# Patient Record
Sex: Female | Born: 1960 | Race: Black or African American | Hispanic: No | Smoking: Never smoker
Health system: Southern US, Community
[De-identification: ages and names within clinical notes are randomized; demographics above are authoritative.]

## PROBLEM LIST (undated history)

## (undated) DIAGNOSIS — G4730 Sleep apnea, unspecified: Secondary | ICD-10-CM

## (undated) DIAGNOSIS — F419 Anxiety disorder, unspecified: Secondary | ICD-10-CM

## (undated) DIAGNOSIS — G43909 Migraine, unspecified, not intractable, without status migrainosus: Secondary | ICD-10-CM

## (undated) DIAGNOSIS — J45909 Unspecified asthma, uncomplicated: Secondary | ICD-10-CM

## (undated) DIAGNOSIS — I10 Essential (primary) hypertension: Secondary | ICD-10-CM

## (undated) DIAGNOSIS — D151 Benign neoplasm of heart: Secondary | ICD-10-CM

## (undated) DIAGNOSIS — I82409 Acute embolism and thrombosis of unspecified deep veins of unspecified lower extremity: Secondary | ICD-10-CM

## (undated) DIAGNOSIS — F4310 Post-traumatic stress disorder, unspecified: Secondary | ICD-10-CM

## (undated) DIAGNOSIS — E119 Type 2 diabetes mellitus without complications: Secondary | ICD-10-CM

## (undated) DIAGNOSIS — E669 Obesity, unspecified: Secondary | ICD-10-CM

## (undated) DIAGNOSIS — D649 Anemia, unspecified: Secondary | ICD-10-CM

## (undated) DIAGNOSIS — M797 Fibromyalgia: Secondary | ICD-10-CM

## (undated) DIAGNOSIS — D303 Benign neoplasm of bladder: Secondary | ICD-10-CM

## (undated) HISTORY — PX: OTHER SURGICAL HISTORY: SHX169

## (undated) HISTORY — PX: BACK SURGERY: SHX140

## (undated) HISTORY — PX: APPENDECTOMY: SHX54

## (undated) HISTORY — DX: Anxiety disorder, unspecified: F41.9

## (undated) HISTORY — DX: Anemia, unspecified: D64.9

## (undated) HISTORY — PX: TONSILLECTOMY: SUR1361

## (undated) HISTORY — DX: Type 2 diabetes mellitus without complications: E11.9

## (undated) HISTORY — DX: Fibromyalgia: M79.7

## (undated) HISTORY — PX: DILATION AND CURETTAGE OF UTERUS: SHX78

## (undated) HISTORY — DX: Benign neoplasm of bladder: D30.3

## (undated) HISTORY — DX: Sleep apnea, unspecified: G47.30

## (undated) HISTORY — DX: Migraine, unspecified, not intractable, without status migrainosus: G43.909

## (undated) HISTORY — DX: Unspecified asthma, uncomplicated: J45.909

## (undated) HISTORY — PX: HEART TUMOR EXCISION: SHX1732

## (undated) HISTORY — DX: Essential (primary) hypertension: I10

## (undated) HISTORY — DX: Post-traumatic stress disorder, unspecified: F43.10

## (undated) HISTORY — DX: Obesity, unspecified: E66.9

---

## 1986-10-08 HISTORY — PX: ECTOPIC PREGNANCY SURGERY: SHX613

## 1986-10-08 HISTORY — PX: OTHER SURGICAL HISTORY: SHX169

## 1991-10-09 HISTORY — PX: BACK SURGERY: SHX140

## 1995-10-09 HISTORY — PX: HEART TUMOR EXCISION: SHX1732

## 1998-09-11 ENCOUNTER — Emergency Department (HOSPITAL_COMMUNITY): Admission: EM | Admit: 1998-09-11 | Discharge: 1998-09-11 | Payer: Self-pay | Admitting: Emergency Medicine

## 2010-12-29 ENCOUNTER — Emergency Department (HOSPITAL_COMMUNITY): Payer: Self-pay

## 2010-12-29 ENCOUNTER — Emergency Department (HOSPITAL_COMMUNITY)
Admission: EM | Admit: 2010-12-29 | Discharge: 2010-12-29 | Disposition: A | Discharge status: Home / Self Care | Payer: Commercial Managed Care - PPO | Attending: Emergency Medicine | Admitting: Emergency Medicine

## 2010-12-29 DIAGNOSIS — R209 Unspecified disturbances of skin sensation: Secondary | ICD-10-CM | POA: Insufficient documentation

## 2010-12-29 DIAGNOSIS — R079 Chest pain, unspecified: Secondary | ICD-10-CM | POA: Insufficient documentation

## 2010-12-29 LAB — COMPREHENSIVE METABOLIC PANEL
Albumin: 3.5 g/dL (ref 3.5–5.2)
BUN: 6 mg/dL (ref 6–23)
Creatinine, Ser: 0.86 mg/dL (ref 0.4–1.2)
Potassium: 3.7 mEq/L (ref 3.5–5.1)
Total Protein: 7 g/dL (ref 6.0–8.3)

## 2010-12-29 LAB — CBC
MCH: 24.8 pg — ABNORMAL LOW (ref 26.0–34.0)
MCV: 79.6 fL (ref 78.0–100.0)
Platelets: 475 10*3/uL — ABNORMAL HIGH (ref 150–400)
RDW: 16.3 % — ABNORMAL HIGH (ref 11.5–15.5)
WBC: 6.1 10*3/uL (ref 4.0–10.5)

## 2010-12-29 LAB — POCT CARDIAC MARKERS
CKMB, poc: <1 ng/mL — ABNORMAL LOW (ref 1.0–8.0)
CKMB, poc: <1 ng/mL — ABNORMAL LOW (ref 1.0–8.0)
Myoglobin, poc: 43.3 ng/mL (ref 12–200)
Myoglobin, poc: 46.4 ng/mL (ref 12–200)
Troponin i, poc: <0.05 ng/mL (ref 0.00–0.09)

## 2012-02-17 ENCOUNTER — Encounter: Payer: Self-pay | Admitting: Emergency Medicine

## 2012-02-17 ENCOUNTER — Emergency Department
Admission: EM | Admit: 2012-02-17 | Discharge: 2012-02-17 | Disposition: A | Discharge status: Home / Self Care | Payer: Commercial Managed Care - PPO | Source: Home / Self Care | Attending: Family Medicine | Admitting: Family Medicine

## 2012-02-17 DIAGNOSIS — R300 Dysuria: Secondary | ICD-10-CM

## 2012-02-17 DIAGNOSIS — N390 Urinary tract infection, site not specified: Secondary | ICD-10-CM

## 2012-02-17 HISTORY — DX: Benign neoplasm of heart: D15.1

## 2012-02-17 LAB — POCT URINALYSIS DIP (MANUAL ENTRY)
Protein Ur, POC: NEGATIVE
Spec Grav, UA: >=1.03 (ref 1.005–1.03)
Urobilinogen, UA: 0.2 (ref 0–1)

## 2012-02-17 MED ORDER — PHENAZOPYRIDINE HCL 200 MG PO TABS: 200.0000 mg | ORAL_TABLET | Freq: Three times a day (TID) | ORAL | Status: AC

## 2012-02-17 MED ORDER — NITROFURANTOIN MONOHYD MACRO 100 MG PO CAPS: 100.0000 mg | ORAL_CAPSULE | Freq: Two times a day (BID) | ORAL | Status: AC

## 2012-02-17 NOTE — ED Notes (Signed)
Experiencing frequency of urination x 6 days. Doing dietary supplementation with protein and Vits. Had fever x 3 days. Today is having right flank area discomfort.

## 2012-02-17 NOTE — ED Provider Notes (Addendum)
History     CSN: [id number]  Arrival date & time 02/17/12  1415   First MD Initiated Contact with Patient 02/17/12 1452      Chief Complaint  Patient presents with  . Urinary Frequency      HPI Comments: Patient complains of four day history of mild dysuria, frequency, hesitancy.  She had a low grade fever initially.  Patient is a 51 y.o. female presenting with dysuria. The history is provided by the patient.  Dysuria  This is a new problem. Episode onset: 4 days ago. The problem occurs intermittently. The problem has not changed since onset.The quality of the pain is described as burning. The pain is mild. There has been no fever. Associated symptoms include chills, frequency, hesitancy and urgency. Pertinent negatives include no sweats, no nausea, no vomiting, no discharge, no hematuria, no possible pregnancy and no flank pain.    Past Medical History  Diagnosis Date  . Fibroma of heart     Past Surgical History  Procedure Date  . Back surgery   . Heart tumor excision     Family History  Problem Relation Age of Onset  . Cancer Mother   . Hypertension Mother   . Hyperlipidemia Mother   . Thyroid disease Sister   . Hypertension Maternal Aunt   . Heart failure Maternal Aunt   . Hypertension Maternal Aunt   . Hypertension Maternal Uncle     History  Substance Use Topics  . Smoking status: Never Smoker   . Smokeless tobacco: Not on file  . Alcohol Use: No    OB History    Grav Para Term Preterm Abortions TAB SAB Ect Mult Living                  Review of Systems  Constitutional: Positive for chills.  Gastrointestinal: Negative for nausea and vomiting.  Genitourinary: Positive for dysuria, hesitancy, urgency and frequency. Negative for hematuria and flank pain.  All other systems reviewed and are negative.    Allergies  Sulfa antibiotics  Home Medications   Current Outpatient Rx  Name Route Sig Dispense Refill  . NITROFURANTOIN MONOHYD MACRO 100 MG  PO CAPS Oral Take 1 capsule (100 mg total) by mouth 2 (two) times daily. 14 capsule 0  . PHENAZOPYRIDINE HCL 200 MG PO TABS Oral Take 1 tablet (200 mg total) by mouth 3 (three) times daily. Take with food 6 tablet 0    BP 158/90  Pulse 68  Temp(Src) 98.2 F (36.8 C) (Oral)  Resp 16  Ht 5' 5.5" (1.664 m)  Wt 204 lb (92.534 kg)  BMI 33.43 kg/m2  SpO2 98%  Physical Exam Nursing notes and Vital Signs reviewed. Appearance:  Patient appears healthy, stated age, and in no acute distress     ED Course  Procedures none   Labs Reviewed  URINE CULTURE pending  POCT URINALYSIS DIP (MANUAL ENTRY) (dated 02/16/12):  SG >= 1.030, mod blood, positive NIT, trace leuks      1. Dysuria   2. Urinary tract infection       MDM   Urine culture pending.  Begin Macrobid for one week; Pyridium for two days. Increase fluid intake.        Lattie Haw, MD 02/17/12 1605  Addendum:   No charge for this visit  Lattie Haw, MD 02/17/12 [phone]

## 2012-02-17 NOTE — Discharge Instructions (Signed)

## 2012-02-20 ENCOUNTER — Telehealth: Payer: Self-pay | Admitting: *Deleted

## 2012-02-20 LAB — URINE CULTURE: Colony Count: >=100000

## 2012-03-16 ENCOUNTER — Emergency Department (INDEPENDENT_AMBULATORY_CARE_PROVIDER_SITE_OTHER)
Admission: EM | Admit: 2012-03-16 | Discharge: 2012-03-16 | Disposition: A | Discharge status: Home / Self Care | Payer: Commercial Managed Care - PPO | Source: Home / Self Care

## 2012-03-16 DIAGNOSIS — R42 Dizziness and giddiness: Secondary | ICD-10-CM

## 2012-03-16 MED ORDER — MECLIZINE HCL 25 MG PO TABS: 25.0000 mg | ORAL_TABLET | Freq: Three times a day (TID) | ORAL | Status: AC | PRN

## 2012-03-16 NOTE — ED Provider Notes (Signed)
History     CSN: [id number]  Arrival date & time 03/16/12  1448   None     No chief complaint on file.  HPI Comments: Dizziness x 2 days  Has had vertiginous sxs going from sitting to standing. Feels like to the room is spinning.  No hearing loss. No headaches.  No vision changes.  No recent trauma.  Has to sit down to relieve sxs.  No CP or SOB. Pt states that she has been under a lot of stress trying to take care of her mother recently, is unsure if this is the cause of her symptoms.  Pt also with baseline hx/o anemia currently receiving iron transfusions for.  Has follow up in 1-2 days.      Past Medical History  Diagnosis Date  . Fibroma of heart     Past Surgical History  Procedure Date  . Back surgery   . Heart tumor excision     Family History  Problem Relation Age of Onset  . Cancer Mother   . Hypertension Mother   . Hyperlipidemia Mother   . Thyroid disease Sister   . Hypertension Maternal Aunt   . Heart failure Maternal Aunt   . Hypertension Maternal Aunt   . Hypertension Maternal Uncle     History  Substance Use Topics  . Smoking status: Never Smoker   . Smokeless tobacco: Not on file  . Alcohol Use: No    OB History    Grav Para Term Preterm Abortions TAB SAB Ect Mult Living                  Review of Systems  Constitutional: Negative for fever, chills, appetite change and fatigue.  HENT: Negative for hearing loss, ear pain, congestion and rhinorrhea.   Eyes: Negative for photophobia and visual disturbance.  Respiratory: Negative for cough, chest tightness and wheezing.   Cardiovascular: Negative for chest pain.  Genitourinary: Negative for dysuria.  Musculoskeletal: Negative for arthralgias and gait problem.  Neurological: Positive for dizziness. Negative for syncope, speech difficulty, weakness, light-headedness and numbness.    Allergies  Sulfa antibiotics  Home Medications  No current outpatient prescriptions on file.  There  were no vitals taken for this visit.  Physical Exam  Constitutional: She appears well-developed and well-nourished.  HENT:  Head: Normocephalic and atraumatic.  Eyes: Conjunctivae are normal. Pupils are equal, round, and reactive to light.  Neck: Normal range of motion. Neck supple.  Cardiovascular: Normal rate and regular rhythm.   Pulmonary/Chest: Effort normal and breath sounds normal.  Abdominal: Soft. Bowel sounds are normal.  Neurological: She is alert.       + dix hallpike,  Mild horizontal nystagmus   Skin: Skin is warm.    ED Course  Procedures   Labs Reviewed - No data to display No results found.   No diagnosis found.  Hgb-14  MDM  Exam consistent with BPPV. Will treat with meclizine. Spot hgb reassuring. Discussed neuro red flags. Handout given. Will follow up as needed.     The patient and/or caregiver has been counseled thoroughly with regard to treatment plan and/or medications prescribed including dosage, schedule, interactions, rationale for use, and possible side effects and they verbalize understanding. Diagnoses and expected course of recovery discussed and will return if not improved as expected or if the condition worsens. Patient and/or caregiver verbalized understanding.               Floydene Flock, MD 03/16/12  1478  Floydene Flock, MD 03/16/12 [phone]

## 2012-03-16 NOTE — Discharge Instructions (Signed)
Vertigo Vertigo means you feel like you or your surroundings are moving when they are not. Vertigo can be dangerous if it occurs when you are at work, driving, or performing difficult activities.  CAUSES  Vertigo occurs when there is a conflict of signals sent to your brain from the visual and sensory systems in your body. There are many different causes of vertigo, including:  Infections, especially in the inner ear.   A bad reaction to a drug or misuse of alcohol and medicines.   Withdrawal from drugs or alcohol.   Rapidly changing positions, such as lying down or rolling over in bed.   A migraine headache.   Decreased blood flow to the brain.   Increased pressure in the brain from a head injury, infection, tumor, or bleeding.  SYMPTOMS  You may feel as though the world is spinning around or you are falling to the ground. Because your balance is upset, vertigo can cause nausea and vomiting. You may have involuntary eye movements (nystagmus). DIAGNOSIS  Vertigo is usually diagnosed by physical exam. If the cause of your vertigo is unknown, your caregiver may perform imaging tests, such as an MRI scan (magnetic resonance imaging). TREATMENT  Most cases of vertigo resolve on their own, without treatment. Depending on the cause, your caregiver may prescribe certain medicines. If your vertigo is related to body position issues, your caregiver may recommend movements or procedures to correct the problem. In rare cases, if your vertigo is caused by certain inner ear problems, you may need surgery. HOME CARE INSTRUCTIONS   Follow your caregiver's instructions.   Avoid driving.   Avoid operating heavy machinery.   Avoid performing any tasks that would be dangerous to you or others during a vertigo episode.   Tell your caregiver if you notice that certain medicines seem to be causing your vertigo. Some of the medicines used to treat vertigo episodes can actually make them worse in some  people.  SEEK IMMEDIATE MEDICAL CARE IF:   Your medicines do not relieve your vertigo or are making it worse.   You develop problems with talking, walking, weakness, or using your arms, hands, or legs.   You develop severe headaches.   Your nausea or vomiting continues or gets worse.   You develop visual changes.   A family member notices behavioral changes.   Your condition gets worse.  MAKE SURE YOU:  Understand these instructions.   Will watch your condition.   Will get help right away if you are not doing well or get worse.  Document Released: 07/04/2005 Document Revised: 09/13/2011 Document Reviewed: 04/12/2011 [employer] Patient Information 2012 [employer]. 

## 2012-03-20 NOTE — ED Provider Notes (Signed)
Pt seen by Dr. Doroteo Bradford, MD 03/20/12 [phone]

## 2012-06-16 ENCOUNTER — Encounter: Payer: Self-pay | Admitting: Family Medicine

## 2012-06-16 ENCOUNTER — Ambulatory Visit: Payer: Commercial Managed Care - PPO | Admitting: Family Medicine

## 2012-06-16 VITALS — BP 169/93 | HR 63

## 2012-06-16 DIAGNOSIS — R0789 Other chest pain: Secondary | ICD-10-CM

## 2012-06-16 LAB — D-DIMER, QUANTITATIVE: D-Dimer, Quant: 0.4 ug/mL-FEU (ref 0.00–0.48)

## 2012-06-16 NOTE — Progress Notes (Signed)
Subjective:    Patient ID: Destiny Henderson, female    DOB: May 08, 1961, 50 y.o.   MRN: [id number]  HPI Mid chest pain all morning on the mid left side of chest. Says feels like has to hold her breath.  Doesn't feel like an elephant on her chest. Pressure is mild.  Says started this morning and now having numbness into th eleft arm.  No pain into her neck today.  Yesterday felt like had a mild ST.   No fever.  No strenous activity over the weekend. Was traveling and was in the car for 6 hours.  No cold sxs.  Chronic iron def tx.  IV iron Q 3 months.  No hx of heart disease.  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  Followed routinely in [place] for this.  Had a normal stress test in Oct 2012.  No on the benicar.  Family hx of heart disease adn stroke.  Mom with hx of BrCa Father with colon Ca.  MGM with COPD.     Review of Systems BP 169/93  Pulse 63  SpO2 99%    Allergies  Allergen Reactions  . Sulfa Antibiotics Rash    Past Medical History  Diagnosis Date  . Fibroma of heart     Past Surgical History  Procedure Date  . Back surgery   . Heart tumor excision     History   Social History  . Marital Status: Single    Spouse Name: N/A    Number of Children: N/A  . Years of Education: N/A   Occupational History  . Not on file.   Social History Main Topics  . Smoking status: Never Smoker   . Smokeless tobacco: Not on file  . Alcohol Use: No  . Drug Use: No  . Sexually Active:    Other Topics Concern  . Not on file   Social History Narrative  . No narrative on file    Family History  Problem Relation Age of Onset  . Cancer Mother   . Hypertension Mother   . Hyperlipidemia Mother   . Thyroid disease Sister   . Hypertension Maternal Aunt   . Heart failure Maternal Aunt   . Hypertension Maternal Aunt   . Hypertension Maternal Uncle     No outpatient encounter prescriptions on file as of 06/16/2012.          Objective:   Physical Exam    Constitutional: She is oriented to person, place, and time. She appears well-developed and well-nourished.  HENT:  Head: Normocephalic and atraumatic.  Cardiovascular: Normal rate, regular rhythm and normal heart sounds.   Pulmonary/Chest: Effort normal and breath sounds normal.  Abdominal: Soft. Bowel sounds are normal. She exhibits no distension and no mass. There is tenderness. There is no rebound and no guarding.       + supra pubic tenderness.   Musculoskeletal: She exhibits no edema.       Mildly tender left calf but no swelling or redness or warmth.   Neurological: She is alert and oriented to person, place, and time.  Skin: Skin is warm and dry.  Psychiatric: She has a normal mood and affect. Her behavior is normal.     No carotid bruits.      Assessment & Plan:  Atypical Chset Pain - discussed that I would like to get an EKG today. Unfortunately she is without insurance and is very concerned about the potential cost.  I  explained to her that ruling out a  heart attack and possibly a PE is extremely important. At this point in time I am concerned about a PE as well because she was traveling in the car for 6 hours yesterday, last night had left leg pain that was so intense it was hard for her fall asleep, and now has had chest pain today. She denies any shortness of breath her pulse ox is normal. EKG shows rate of 58 bpm, inverted T waves in Lead 3, aVR, poor R wave progression. No acute ST-T wave changes.  Will check cardiac enzymes. She also normally gets IV iron infusions every 3 months her last one was in March, there again because of insurance reasons. She can also be severely anemic which may be contributing to some of her chest pain.  Hypertension-not well controlled. Right now she wants to not take medications. She prefers holistic approach. I discussed with her that she might want to really consider setting a time frame such as 3-6 months or if she does not return blood  pressure that time that she strongly considers medication. She is a very strong family history I reminded her of the increased risk of heart attack and stroke.

## 2012-06-17 ENCOUNTER — Encounter: Payer: Self-pay | Admitting: *Deleted

## 2012-06-17 LAB — CBC WITH DIFFERENTIAL/PLATELET
Eosinophils Relative: 4 % (ref 0–5)
HCT: 41.6 % (ref 36.0–46.0)
Lymphocytes Relative: 39 % (ref 12–46)
Lymphs Abs: 3 10*3/uL (ref 0.7–4.0)
MCV: 92.4 fL (ref 78.0–100.0)
Platelets: 365 10*3/uL (ref 150–400)
RBC: 4.5 MIL/uL (ref 3.87–5.11)
WBC: 7.6 10*3/uL (ref 4.0–10.5)

## 2012-06-17 MED ORDER — HYDROCHLOROTHIAZIDE 25 MG PO TABS: 25.0000 mg | ORAL_TABLET | Freq: Every day | ORAL | Status: DC

## 2012-06-17 NOTE — Addendum Note (Signed)
Addended by: Nani Gasser D on: 06/17/2012 01:50 PM   Modules accepted: Orders

## 2012-06-18 ENCOUNTER — Telehealth: Payer: Self-pay | Admitting: *Deleted

## 2012-06-18 DIAGNOSIS — R0789 Other chest pain: Secondary | ICD-10-CM

## 2012-09-10 ENCOUNTER — Telehealth: Payer: Self-pay | Admitting: *Deleted

## 2012-09-10 MED ORDER — FLUCONAZOLE 150 MG PO TABS: 150.0000 mg | ORAL_TABLET | Freq: Once | ORAL | Status: DC

## 2012-09-10 MED ORDER — CIPROFLOXACIN HCL 500 MG PO TABS: 500.0000 mg | ORAL_TABLET | Freq: Two times a day (BID) | ORAL | Status: AC

## 2012-09-10 NOTE — Telephone Encounter (Signed)
Had sxs's since Sunday and no fever 96.7

## 2012-09-10 NOTE — Telephone Encounter (Signed)
Ok, rx sent. Let document how may day sxs and if fever or not.

## 2012-09-10 NOTE — Telephone Encounter (Signed)
Patient requesting something for UTI and for yeast infection also, intol with ABT"S

## 2013-01-02 ENCOUNTER — Ambulatory Visit: Payer: Commercial Managed Care - PPO | Admitting: Family Medicine

## 2013-03-03 ENCOUNTER — Ambulatory Visit (INDEPENDENT_AMBULATORY_CARE_PROVIDER_SITE_OTHER): Payer: BC Managed Care – PPO | Admitting: Family Medicine

## 2013-03-03 ENCOUNTER — Encounter: Payer: Self-pay | Admitting: Family Medicine

## 2013-03-03 VITALS — BP 161/89 | HR 64 | Wt 218.0 lb

## 2013-03-03 DIAGNOSIS — N926 Irregular menstruation, unspecified: Secondary | ICD-10-CM

## 2013-03-03 DIAGNOSIS — Z1239 Encounter for other screening for malignant neoplasm of breast: Secondary | ICD-10-CM

## 2013-03-03 DIAGNOSIS — E669 Obesity, unspecified: Secondary | ICD-10-CM

## 2013-03-03 DIAGNOSIS — I10 Essential (primary) hypertension: Secondary | ICD-10-CM

## 2013-03-03 DIAGNOSIS — D509 Iron deficiency anemia, unspecified: Secondary | ICD-10-CM

## 2013-03-03 HISTORY — DX: Obesity, unspecified: E66.9

## 2013-03-03 HISTORY — DX: Morbid (severe) obesity due to excess calories: E66.01

## 2013-03-03 HISTORY — DX: Essential (primary) hypertension: I10

## 2013-03-03 MED ORDER — LISINOPRIL 30 MG PO TABS: 30.0000 mg | ORAL_TABLET | Freq: Every day | ORAL | Status: DC

## 2013-03-03 NOTE — Progress Notes (Signed)
CC: Destiny Henderson is a 52 y.o. female is here for discuss weight loss medication and mammogram order   Subjective: HPI:  Patient complains of weight gain unintentional for many years. Over the last month gained 10 pounds without change in diet or exercise regimen. Has struggled with obesity for years. Slight improvement while on Weight Watchers, nothing else is made better or worse. Weight gain over the past year is described as moderate in severity. Family history significant for diabetes and thyroid abnormality. She has done research on phentermine would like to know she can start this.  Denies hair or skin changes, diarrhea, constipation, nausea, vomiting, tremor, anxiety, nor depression  Patient complains of mild menstrual irregularity has had Mirena in 4 less than 5 years. Denies pelvic pain or menstrual discomfort, mother had surgical menopause unknown family history for typical menopause age  Patient believes she is due for mammogram, family history positive mother   Review Of Systems Outlined In HPI  Past Medical History  Diagnosis Date  . Fibroma of heart   . Essential hypertension, benign 03/03/2013  . Obesity 03/03/2013     Family History  Problem Relation Age of Onset  . Breast cancer Mother   . Hypertension Mother   . Hyperlipidemia Mother   . Thyroid disease Sister   . Hypertension Maternal Aunt   . Heart failure Maternal Aunt   . Hypertension Maternal Aunt   . Hypertension Maternal Uncle   . Colon cancer Father      History  Substance Use Topics  . Smoking status: Never Smoker   . Smokeless tobacco: Not on file  . Alcohol Use: No     Objective: Filed Vitals:   03/03/13 1411  BP: 161/89  Pulse: 64    Vital signs reviewed. General: Alert and Oriented, No Acute Distress HEENT: Pupils equal, round, reactive to light. Conjunctivae clear.  External ears unremarkable.  Moist mucous membranes. Lungs: Clear and comfortable work of breathing, speaking  in full sentences without accessory muscle use. Cardiac: Regular rate and rhythm.  Neuro: CN II-XII grossly intact, gait normal. Extremities: No peripheral edema.  Strong peripheral pulses.  Mental Status: No depression, anxiety, nor agitation. Logical though process. Skin: Warm and dry.  Assessment & Plan: Destiny Henderson was seen today for discuss weight loss medication and mammogram order.  Diagnoses and associated orders for this visit:  Essential hypertension, benign - TSH - T4, free - T3, free - COMPLETE METABOLIC PANEL WITH GFR - lisinopril (PRINIVIL,ZESTRIL) 30 MG tablet; Take 1 tablet (30 mg total) by mouth daily.  Obesity - TSH - T4, free - T3, free - COMPLETE METABOLIC PANEL WITH GFR  Irregular menstrual cycle - FSH - LH  Breast cancer screening - MM Digital Diagnostic Bilat; Future  Other Orders - FLAXSEED, LINSEED, PO; Take by mouth. - Multiple Vitamins-Minerals (MEGA MULTIVITAMIN FOR WOMEN) TABS; Take by mouth. - VITAMIN A OP; Apply to eye. - VITAMIN D, CHOLECALCIFEROL, PO; Take by mouth. - Coenzyme Q10 (CO Q 10 PO); Take by mouth. - fish oil-omega-3 fatty acids 1000 MG capsule; Take 2 g by mouth daily. - ECHINACEA PO; Take by mouth.    Essential hypertension: Uncontrolled chronic condition, she did not tolerate hydrochlorothiazide therefore we'll start lisinopril. Obesity: Will rule out metabolic causes above, discussed phentermine use would require better control blood pressure, if she can reach a pressure below 140/90 I would be happy to manage phentermine with her Will look into premenopausal with above labs Mammogram order placed  Return in about 4 weeks (around 03/31/2013).

## 2013-03-04 LAB — CBC
HCT: 41.9 % (ref 36.0–46.0)
Hemoglobin: 13.9 g/dL (ref 12.0–15.0)
MCV: 90.9 fL (ref 78.0–100.0)
RBC: 4.61 MIL/uL (ref 3.87–5.11)
WBC: 5.1 10*3/uL (ref 4.0–10.5)

## 2013-03-04 LAB — COMPLETE METABOLIC PANEL WITH GFR
ALT: 19 U/L (ref 0–35)
AST: 20 U/L (ref 0–37)
Alkaline Phosphatase: 45 U/L (ref 39–117)
CO2: 28 mEq/L (ref 19–32)
GFR, Est African American: 85 mL/min
Sodium: 139 mEq/L (ref 135–145)
Total Bilirubin: 0.4 mg/dL (ref 0.3–1.2)
Total Protein: 6.8 g/dL (ref 6.0–8.3)

## 2013-03-04 LAB — T3, FREE: T3, Free: 3 pg/mL (ref 2.3–4.2)

## 2013-03-04 LAB — T4, FREE: Free T4: 1.11 ng/dL (ref 0.80–1.80)

## 2013-03-04 NOTE — Addendum Note (Signed)
Addended by: Laren Boom on: 03/04/2013 08:18 AM   Modules accepted: Orders

## 2013-03-05 ENCOUNTER — Ambulatory Visit (INDEPENDENT_AMBULATORY_CARE_PROVIDER_SITE_OTHER): Payer: BC Managed Care – PPO | Admitting: Family Medicine

## 2013-03-05 ENCOUNTER — Encounter: Payer: Self-pay | Admitting: Family Medicine

## 2013-03-05 VITALS — BP 176/100 | HR 73

## 2013-03-05 DIAGNOSIS — R0789 Other chest pain: Secondary | ICD-10-CM

## 2013-03-05 LAB — TROPONIN I: Troponin I: <0.01 ng/mL (ref <0.06)

## 2013-03-05 LAB — CK TOTAL AND CKMB (NOT AT ARMC): Relative Index: 1.3 (ref 0.0–2.5)

## 2013-03-05 MED ORDER — LISINOPRIL-HYDROCHLOROTHIAZIDE 20-25 MG PO TABS: 1.0000 | ORAL_TABLET | Freq: Every day | ORAL | Status: DC

## 2013-03-05 NOTE — Progress Notes (Signed)
Subjective:    Patient ID: Destiny Henderson, female    DOB: 01/04/1961, 52 y.o.   MRN: [id number]  HPI Says was supposed to leave town today. Started feeling back about an hour ago. Got sudden onset of blurry vision, nausea and chest pain (CP only lasted about 3 min) and then felt really sweaty. Felt "strange".  She denies feeling anxious. She says it didn't feel like a panic attack to her. She does feel little bit better now. Division a stool off a little bit but overall is better. Chest pain has resolved. As well as the nausea. Has started the lisinopril for about 3 days.  Also has some achiness in the right shoulder off and on for 3 weeks,  but better today.  No worsening or alleviating symptoms. No known triggers. No problems swallowing or sore throat. No headache. No mental status changes. No hearing changes. She did feel little dizzy afterwards as well.   Review of Systems  BP 176/100  Pulse 73    Allergies  Allergen Reactions  . Sulfa Antibiotics Rash    Past Medical History  Diagnosis Date  . Fibroma of heart   . Essential hypertension, benign 03/03/2013  . Obesity 03/03/2013    Past Surgical History  Procedure Laterality Date  . Back surgery    . Heart tumor excision      History   Social History  . Marital Status: Single    Spouse Name: N/A    Number of Children: N/A  . Years of Education: N/A   Occupational History  . Not on file.   Social History Main Topics  . Smoking status: Never Smoker   . Smokeless tobacco: Not on file  . Alcohol Use: No  . Drug Use: No  . Sexually Active:    Other Topics Concern  . Not on file   Social History Narrative  . No narrative on file    Family History  Problem Relation Age of Onset  . Breast cancer Mother   . Hypertension Mother   . Hyperlipidemia Mother   . Thyroid disease Sister   . Hypertension Maternal Aunt   . Heart failure Maternal Aunt   . Hypertension Maternal Aunt   . Hypertension Maternal  Uncle   . Colon cancer Father     Outpatient Encounter Prescriptions as of 03/05/2013  Medication Sig Dispense Refill  . Coenzyme Q10 (CO Q 10 PO) Take by mouth.      . ECHINACEA PO Take by mouth.      . fish oil-omega-3 fatty acids 1000 MG capsule Take 2 g by mouth daily.      Marland Kitchen FLAXSEED, LINSEED, PO Take by mouth.      Marland Kitchen lisinopril-hydrochlorothiazide (PRINZIDE,ZESTORETIC) 20-25 MG per tablet Take 1 tablet by mouth daily.  30 tablet  0  . Multiple Vitamins-Minerals (MEGA MULTIVITAMIN FOR WOMEN) TABS Take by mouth.      Marland Kitchen VITAMIN A OP Apply to eye.      Marland Kitchen VITAMIN D, CHOLECALCIFEROL, PO Take by mouth.      . [DISCONTINUED] lisinopril (PRINIVIL,ZESTRIL) 30 MG tablet Take 1 tablet (30 mg total) by mouth daily.  30 tablet  11   No facility-administered encounter medications on file as of 03/05/2013.          Objective:   Physical Exam  Constitutional: She is oriented to person, place, and time. She appears well-developed and well-nourished.  HENT:  Head: Normocephalic and atraumatic.  Cardiovascular: Normal rate, regular  rhythm and normal heart sounds.   Pulmonary/Chest: Effort normal and breath sounds normal. She exhibits no tenderness.  Abdominal: Soft. Bowel sounds are normal. She exhibits no distension and no mass. There is no tenderness. There is no rebound and no guarding.  Musculoskeletal: She exhibits edema.  Trace ankle edema   Neurological: She is alert and oriented to person, place, and time.  Skin: Skin is warm and dry.  Psychiatric: She has a normal mood and affect. Her behavior is normal.          Assessment & Plan:  Atypical Chest pain - unclear etiology. May be secondary to elevated blood pressure though the episode may have caused the elevated blood pressure unclear. She has had poorly controlled blood pressure for quite some time. She really wants to take her nitroglycerin as to approach her health and has been somewhat resistant to taking prescription  medications. She did start lisinopril 3 days ago. EKG performed. Would also like to get cardiac enzymes. She will hold off leaving town until tomorrow until she hears back from [name]. EKG shows rate of 63 bpm.  Normal sinus rhythm. She does have inverted T waves in lead 3. Poor R wave progression. No acute changes. Unchanged from previous EKG from September 2013. She recently had full labs including thyroid etc. which were all normal look fantastic. In fact her anemia had resolved.  Hypertension-uncontrolled-we will add back her low dose diuretic. She has noticed a little swelling around her ankles recently, since stopping it. Will change to lisinopril HCTZ. New perception since the pharmacy and then she can pick it up tomorrow. I would like to see her back in 2 weeks. I did explain that it can take several days for blood pressure to lower after starting a new blood pressure medication.

## 2013-03-06 ENCOUNTER — Ambulatory Visit: Payer: BC Managed Care – PPO | Admitting: Family Medicine

## 2013-03-06 ENCOUNTER — Telehealth: Payer: Self-pay | Admitting: *Deleted

## 2013-03-06 NOTE — Telephone Encounter (Signed)
Error

## 2013-03-09 ENCOUNTER — Telehealth: Payer: Self-pay

## 2013-03-09 NOTE — Telephone Encounter (Signed)
Changed in system.

## 2013-03-09 NOTE — Telephone Encounter (Signed)
Destiny Henderson called, Imaging department: She wanted to know if Jernie needed a screening mammogram or a diagnostic?

## 2013-03-09 NOTE — Telephone Encounter (Signed)
Marylene Land, I'm sorry, I believe only a screening mammogram is needed.

## 2013-03-10 ENCOUNTER — Encounter: Payer: Self-pay | Admitting: *Deleted

## 2013-04-06 ENCOUNTER — Ambulatory Visit (INDEPENDENT_AMBULATORY_CARE_PROVIDER_SITE_OTHER): Payer: BC Managed Care – PPO | Admitting: Family Medicine

## 2013-04-06 ENCOUNTER — Encounter: Payer: Self-pay | Admitting: Family Medicine

## 2013-04-06 VITALS — BP 158/91 | HR 59 | Wt 213.0 lb

## 2013-04-06 DIAGNOSIS — I10 Essential (primary) hypertension: Secondary | ICD-10-CM

## 2013-04-06 DIAGNOSIS — E669 Obesity, unspecified: Secondary | ICD-10-CM

## 2013-04-06 MED ORDER — PHENTERMINE HCL 37.5 MG PO CAPS: 37.5000 mg | ORAL_CAPSULE | ORAL | Status: DC

## 2013-04-06 NOTE — Progress Notes (Signed)
CC: Destiny Henderson is a 52 y.o. female is here for Hypertension and Discuss wt loss   Subjective: HPI:  Followup essential hypertension: She has been taking lisinopril hydrochlorothiazide on a daily basis without missed doses. Outside blood pressures 99% of the time her prehypertensive or normotensive, she has a few low stage I outliers systolics seem to be the issue.  Significant improvement since last visit one month ago. She has cut out salt in her diet no longer eating canned foods, she is starting a new exercise regimen most days of the week. She has significantly increased the juice and fruits in her diet. She denies chest pain, shortness of breath, irregular heart beat, orthopnea, peripheral edema, nor motor or sensory disturbances  Obesity: Patient has made the above interventions in the last month has lost 4 pounds intentionally. She reports difficulty over the last year keeping weight loss off and consistent. She expresses interest in seeing a nutritionist and would like counseling or pharmaceutical weight loss therapy.    Review Of Systems Outlined In HPI  Past Medical History  Diagnosis Date  . Fibroma of heart   . Essential hypertension, benign 03/03/2013  . Obesity 03/03/2013     Family History  Problem Relation Age of Onset  . Breast cancer Mother   . Hypertension Mother   . Hyperlipidemia Mother   . Thyroid disease Sister   . Hypertension Maternal Aunt   . Heart failure Maternal Aunt   . Hypertension Maternal Aunt   . Hypertension Maternal Uncle   . Colon cancer Father      History  Substance Use Topics  . Smoking status: Never Smoker   . Smokeless tobacco: Not on file  . Alcohol Use: No     Objective: Filed Vitals:   04/06/13 0827  BP: 158/91  Pulse: 59    Vital signs reviewed. General: Alert and Oriented, No Acute Distress HEENT: Pupils equal, round, reactive to light. Conjunctivae clear.  External ears unremarkable.  Moist mucous  membranes. Lungs: Clear and comfortable work of breathing, speaking in full sentences without accessory muscle use. Cardiac: Regular rate and rhythm.  Neuro: CN II-XII grossly intact, gait normal. Abdomen: Obese soft nontender Extremities: No peripheral edema.  Strong peripheral pulses.  Mental Status: No depression, anxiety, nor agitation. Logical though process. Skin: Warm and dry.  Assessment & Plan: Destiny Henderson was seen today for hypertension and discuss wt loss.  Diagnoses and associated orders for this visit:  Obesity, unspecified - Ambulatory referral to diabetic education - phentermine 37.5 MG capsule; Take 1 capsule (37.5 mg total) by mouth every morning.  Essential hypertension, benign    Essential hypertension: Improved and controlled, despite hypertension today she has improved but blood pressures are significantly improved 99% of the 30 days she has taken it since I saw her last. Fortunately some of these if not most have been done here in our office.  Continue lisinopril Hydrocort thiazide Obesity: Uncontrolled chronic condition, we discussed medical weight loss therapies including but not limited to Topamax, phentermine,belviq.  She's done some research on her own is interested in phentermine. Discussed risks of increasing blood pressure and other side effects that would warrant cessation. She is agreeable to continuing blood pressure checks on a daily basis she'll notify me next week with how things are going with formal way in in one month.  25 minutes spent face-to-face during visit today of which at least 50% was counseling or coordinating care regarding essential hypertension, obesity.   Return  in about 4 weeks (around 05/04/2013).

## 2013-04-17 ENCOUNTER — Other Ambulatory Visit: Payer: Self-pay | Admitting: Family Medicine

## 2013-04-19 ENCOUNTER — Telehealth: Payer: Self-pay | Admitting: *Deleted

## 2013-04-20 ENCOUNTER — Telehealth: Payer: Self-pay | Admitting: *Deleted

## 2013-04-20 ENCOUNTER — Ambulatory Visit: Payer: BC Managed Care – PPO | Admitting: Dietician

## 2013-04-21 ENCOUNTER — Encounter: Payer: Self-pay | Admitting: Family Medicine

## 2013-04-21 ENCOUNTER — Ambulatory Visit (INDEPENDENT_AMBULATORY_CARE_PROVIDER_SITE_OTHER): Payer: BC Managed Care – PPO | Admitting: Family Medicine

## 2013-04-21 ENCOUNTER — Ambulatory Visit (INDEPENDENT_AMBULATORY_CARE_PROVIDER_SITE_OTHER): Payer: BC Managed Care – PPO

## 2013-04-21 VITALS — BP 160/81 | HR 58 | Temp 99.3°F

## 2013-04-21 DIAGNOSIS — R509 Fever, unspecified: Secondary | ICD-10-CM

## 2013-04-21 DIAGNOSIS — J029 Acute pharyngitis, unspecified: Secondary | ICD-10-CM

## 2013-04-21 DIAGNOSIS — R05 Cough: Secondary | ICD-10-CM

## 2013-04-21 DIAGNOSIS — R0602 Shortness of breath: Secondary | ICD-10-CM

## 2013-04-21 DIAGNOSIS — R059 Cough, unspecified: Secondary | ICD-10-CM

## 2013-04-21 DIAGNOSIS — R062 Wheezing: Secondary | ICD-10-CM

## 2013-04-21 DIAGNOSIS — H9209 Otalgia, unspecified ear: Secondary | ICD-10-CM

## 2013-04-21 DIAGNOSIS — H9202 Otalgia, left ear: Secondary | ICD-10-CM

## 2013-04-21 MED ORDER — AMOXICILLIN-POT CLAVULANATE 875-125 MG PO TABS: 1.0000 | ORAL_TABLET | Freq: Two times a day (BID) | ORAL | Status: DC

## 2013-04-21 NOTE — Patient Instructions (Addendum)
Complete the antibiotic. If you're not feeling better within 34 days on the antibiotic then please let me know. Can continue to use Tylenol as needed for fever and pain control.

## 2013-04-21 NOTE — Progress Notes (Signed)
  Subjective:    Patient ID: Destiny Henderson, female    DOB: [birth year], 52 y.o.   MRN: [id number]  HPI Was working out 8 days ago and got overheaded adn felt really nauseated. Sat down and cooled down. BP was high. It came back down. She went home and temp was 102.  FEver yesterday was 101.  Had a mild ST and neg for strep. Culture was neg too.  Have chills, bodyaches.  Fever in AM and at night.  Post nasal congestion. Coughing up green sputum and changed to brown color. Wheezing this AM. No hx of asthma. No GI sxs. Decreased appetite.  Dec sense of tase or smell. HA today.  Left ear was hurting as well    Review of Systems     Objective:   Physical Exam  Constitutional: She is oriented to person, place, and time. She appears well-developed and well-nourished.  HENT:  Head: Normocephalic and atraumatic.  Right Ear: External ear normal.  Left Ear: External ear normal.  Nose: Nose normal.  Mouth/Throat: Oropharynx is clear and moist.  TMs and canals are clear.   Eyes: Conjunctivae and EOM are normal. Pupils are equal, round, and reactive to light.  Neck: Neck supple. No thyromegaly present.  Cardiovascular: Normal rate, regular rhythm and normal heart sounds.   Pulmonary/Chest: Effort normal. She has wheezes.  High pitched expiratory wheeze. Actually on a sounds like a squeak. It's diffuse throughout the chest. An is able to be heard anteriorly and posteriorly.  Lymphadenopathy:    She has no cervical adenopathy.  Neurological: She is alert and oriented to person, place, and time.  Skin: Skin is warm and dry.  Psychiatric: She has a normal mood and affect.          Assessment & Plan:  Bronchitis/sinusitis versus pneumonia-she's now had a fever for 7 days I'm very concerned about pneumonia. Also the color of her sputum has changed from green to brown which is also very concerning. We'll send for chest x-ray.  CXR was neg for infection. Will tx for sinusits with augmentin.  Call if ST, ear pain and fever are not better. Will eval for CMV, EBV if not improving.

## 2013-04-28 ENCOUNTER — Ambulatory Visit: Payer: BC Managed Care – PPO

## 2013-04-30 ENCOUNTER — Ambulatory Visit: Payer: BC Managed Care – PPO | Admitting: Dietician

## 2013-05-07 ENCOUNTER — Ambulatory Visit (INDEPENDENT_AMBULATORY_CARE_PROVIDER_SITE_OTHER): Payer: BC Managed Care – PPO

## 2013-05-07 DIAGNOSIS — Z1231 Encounter for screening mammogram for malignant neoplasm of breast: Secondary | ICD-10-CM

## 2013-05-11 ENCOUNTER — Telehealth: Payer: Self-pay | Admitting: *Deleted

## 2013-05-11 ENCOUNTER — Ambulatory Visit (INDEPENDENT_AMBULATORY_CARE_PROVIDER_SITE_OTHER): Payer: BC Managed Care – PPO | Admitting: Family Medicine

## 2013-05-11 ENCOUNTER — Encounter: Payer: Self-pay | Admitting: Family Medicine

## 2013-05-11 VITALS — BP 127/74 | HR 62 | Wt 209.0 lb

## 2013-05-11 DIAGNOSIS — K5900 Constipation, unspecified: Secondary | ICD-10-CM

## 2013-05-11 DIAGNOSIS — E669 Obesity, unspecified: Secondary | ICD-10-CM

## 2013-05-11 DIAGNOSIS — I10 Essential (primary) hypertension: Secondary | ICD-10-CM

## 2013-05-11 MED ORDER — LISINOPRIL-HYDROCHLOROTHIAZIDE 20-25 MG PO TABS: 1.0000 | ORAL_TABLET | Freq: Every day | ORAL | Status: DC

## 2013-05-11 MED ORDER — PHENTERMINE HCL 37.5 MG PO CAPS: 37.5000 mg | ORAL_CAPSULE | ORAL | Status: DC

## 2013-05-11 MED ORDER — PSYLLIUM 28 % PO PACK: 1.0000 | PACK | Freq: Two times a day (BID) | ORAL | Status: DC

## 2013-05-11 NOTE — Telephone Encounter (Signed)
This certainly could be because of constipation, start with the psyllium powder that we discussed on Monday I would encourage her to take this for 5 days straight if not improving we can consider a CT scan but this would require an office visit to ensure that insurance would cover this

## 2013-05-11 NOTE — Progress Notes (Signed)
CC: Destiny Henderson is a 52 y.o. female is here for BP and Wt check   Subjective: HPI:  Followup obesity: She's been taking phentermine on a daily basis in the late afternoon she has had some trouble with getting to sleep but otherwise denies any other side effects. She's having success with portion control and decreased caloric intake over the course of the day. She's noticed she's losing about 5 pounds a month, she is walking on a daily basis doing cardio 2-3 times a week. Denies chest pain, irregular heartbeat, anxiety, tremor  Followup essential hypertension: She has been taking lisinopril hydrochlorothiazide on a daily basis without cough angioedema nor muscle cramps. Blood pressures at home have consistently been below 140/90. Denies headaches, chest pain, shortness of breath, motor sensory disturbances  She complains of occasional constipation that comes 1-2 times a week nothing particularly makes worse it has improved with Metamucil in the past. She thinks it may have gotten worse since starting hydrochlorothiazide currently mild to moderate in severity has been of mild severity for over a year. Denies blood in stool nor painful defecation nor abdominal pain.    Review Of Systems Outlined In HPI  Past Medical History  Diagnosis Date  . Fibroma of heart   . Essential hypertension, benign 03/03/2013  . Obesity 03/03/2013     Family History  Problem Relation Age of Onset  . Breast cancer Mother   . Hypertension Mother   . Hyperlipidemia Mother   . Thyroid disease Sister   . Hypertension Maternal Aunt   . Heart failure Maternal Aunt   . Hypertension Maternal Aunt   . Hypertension Maternal Uncle   . Colon cancer Father      History  Substance Use Topics  . Smoking status: Never Smoker   . Smokeless tobacco: Not on file  . Alcohol Use: No     Objective: Filed Vitals:   05/11/13 1303  BP: 127/74  Pulse: 62   Vital signs reviewed. General: Alert and Oriented,  No Acute Distress HEENT: Pupils equal, round, reactive to light. Conjunctivae clear.  External ears unremarkable.  Moist mucous membranes. Lungs: Clear and comfortable work of breathing, speaking in full sentences without accessory muscle use. Cardiac: Regular rate and rhythm.  Neuro: CN II-XII grossly intact, gait normal. Extremities: No peripheral edema.  Strong peripheral pulses.  Mental Status: No depression, anxiety, nor agitation. Logical though process. Skin: Warm and dry.  Assessment & Plan: Destiny Henderson was seen today for bp and wt check.  Diagnoses and associated orders for this visit:  Essential hypertension, benign - lisinopril-hydrochlorothiazide (PRINZIDE,ZESTORETIC) 20-25 MG per tablet; Take 1 tablet by mouth daily.  Obesity  Obesity, unspecified - phentermine 37.5 MG capsule; Take 1 capsule (37.5 mg total) by mouth every morning.  Constipation - psyllium (METAMUCIL SMOOTH TEXTURE) 28 % packet; Take 1 packet by mouth 2 (two) times daily. PRN Constipation.    Essential hypertension: Controlled chronic condition continue lisinopril hydrochlorothiazide Obesity: Improved continue phentermine but I encouraged her to take this first thing in the morning to avoid sleep disturbance Constipation: Chronic uncontrolled condition Start Metamucil use as needed  25 minutes spent face-to-face during visit today of which at least 50% was counseling or coordinating care regarding obesity, hypertension, obstipation.   Return in about 4 weeks (around 06/08/2013).

## 2013-05-11 NOTE — Telephone Encounter (Signed)
Pt wants to know if you can order a CT. She has had abd for 4 days.I saw a dx of constipation and advised that constipation would cause abd pain. I advised that she may want to try miralax first just because insurance may deny a prior auth if she hasnt tried anything to relieve it yet. What do you think?

## 2013-05-12 NOTE — Telephone Encounter (Signed)
Pt.notified

## 2013-05-14 ENCOUNTER — Ambulatory Visit: Payer: BC Managed Care – PPO | Admitting: Dietician

## 2013-05-20 ENCOUNTER — Encounter: Payer: Self-pay | Admitting: Family Medicine

## 2013-05-20 ENCOUNTER — Ambulatory Visit (INDEPENDENT_AMBULATORY_CARE_PROVIDER_SITE_OTHER): Payer: BC Managed Care – PPO | Admitting: Family Medicine

## 2013-05-20 VITALS — BP 158/86 | HR 63 | Resp 16 | Ht 65.0 in | Wt 212.0 lb

## 2013-05-20 DIAGNOSIS — N920 Excessive and frequent menstruation with regular cycle: Secondary | ICD-10-CM

## 2013-05-20 DIAGNOSIS — Z01419 Encounter for gynecological examination (general) (routine) without abnormal findings: Secondary | ICD-10-CM

## 2013-05-20 DIAGNOSIS — Z1151 Encounter for screening for human papillomavirus (HPV): Secondary | ICD-10-CM

## 2013-05-20 DIAGNOSIS — Z124 Encounter for screening for malignant neoplasm of cervix: Secondary | ICD-10-CM

## 2013-05-20 HISTORY — DX: Excessive and frequent menstruation with regular cycle: N92.0

## 2013-05-20 NOTE — Patient Instructions (Signed)
Preventive Care for Adults, Female A healthy lifestyle and preventive care can promote health and wellness. Preventive health guidelines for women include the following key practices.  A routine yearly physical is a good way to check with your caregiver about your health and preventive screening. It is a chance to share any concerns and updates on your health, and to receive a thorough exam.  Visit your dentist for a routine exam and preventive care every 6 months. Brush your teeth twice a day and floss once a day. Good oral hygiene prevents tooth decay and gum disease.  The frequency of eye exams is based on your age, health, family medical history, use of contact lenses, and other factors. Follow your caregiver's recommendations for frequency of eye exams.  Eat a healthy diet. Foods like vegetables, fruits, whole grains, low-fat dairy products, and lean protein foods contain the nutrients you need without too many calories. Decrease your intake of foods high in solid fats, added sugars, and salt. Eat the right amount of calories for you.Get information about a proper diet from your caregiver, if necessary.  Regular physical exercise is one of the most important things you can do for your health. Most adults should get at least 150 minutes of moderate-intensity exercise (any activity that increases your heart rate and causes you to sweat) each week. In addition, most adults need muscle-strengthening exercises on 2 or more days a week.  Maintain a healthy weight. The body mass index (BMI) is a screening tool to identify possible weight problems. It provides an estimate of body fat based on height and weight. Your caregiver can help determine your BMI, and can help you achieve or maintain a healthy weight.For adults 20 years and older:  A BMI below 18.5 is considered underweight.  A BMI of 18.5 to 24.9 is normal.  A BMI of 25 to 29.9 is considered overweight.  A BMI of 30 and above is  considered obese.  Maintain normal blood lipids and cholesterol levels by exercising and minimizing your intake of saturated fat. Eat a balanced diet with plenty of fruit and vegetables. Blood tests for lipids and cholesterol should begin at age 20 and be repeated every 5 years. If your lipid or cholesterol levels are high, you are over 50, or you are at high risk for heart disease, you may need your cholesterol levels checked more frequently.Ongoing high lipid and cholesterol levels should be treated with medicines if diet and exercise are not effective.  If you smoke, find out from your caregiver how to quit. If you do not use tobacco, do not start.  If you are pregnant, do not drink alcohol. If you are breastfeeding, be very cautious about drinking alcohol. If you are not pregnant and choose to drink alcohol, do not exceed 1 drink per day. One drink is considered to be 12 ounces (355 mL) of beer, 5 ounces (148 mL) of wine, or 1.5 ounces (44 mL) of liquor.  Avoid use of street drugs. Do not share needles with anyone. Ask for help if you need support or instructions about stopping the use of drugs.  High blood pressure causes heart disease and increases the risk of stroke. Your blood pressure should be checked at least every 1 to 2 years. Ongoing high blood pressure should be treated with medicines if weight loss and exercise are not effective.  If you are 55 to 51 years old, ask your caregiver if you should take aspirin to prevent strokes.  Diabetes   screening involves taking a blood sample to check your fasting blood sugar level. This should be done once every 3 years, after age 45, if you are within normal weight and without risk factors for diabetes. Testing should be considered at a younger age or be carried out more frequently if you are overweight and have at least 1 risk factor for diabetes.  Breast cancer screening is essential preventive care for women. You should practice "breast  self-awareness." This means understanding the normal appearance and feel of your breasts and may include breast self-examination. Any changes detected, no matter how small, should be reported to a caregiver. Women in their 20s and 30s should have a clinical breast exam (CBE) by a caregiver as part of a regular health exam every 1 to 3 years. After age 40, women should have a CBE every year. Starting at age 40, women should consider having a mammography (breast X-ray test) every year. Women who have a family history of breast cancer should talk to their caregiver about genetic screening. Women at a high risk of breast cancer should talk to their caregivers about having magnetic resonance imaging (MRI) and a mammography every year.  The Pap test is a screening test for cervical cancer. A Pap test can show cell changes on the cervix that might become cervical cancer if left untreated. A Pap test is a procedure in which cells are obtained and examined from the lower end of the uterus (cervix).  Women should have a Pap test starting at age 21.  Between ages 21 and 29, Pap tests should be repeated every 2 years.  Beginning at age 30, you should have a Pap test every 3 years as long as the past 3 Pap tests have been normal.  Some women have medical problems that increase the chance of getting cervical cancer. Talk to your caregiver about these problems. It is especially important to talk to your caregiver if a new problem develops soon after your last Pap test. In these cases, your caregiver may recommend more frequent screening and Pap tests.  The above recommendations are the same for women who have or have not gotten the vaccine for human papillomavirus (HPV).  If you had a hysterectomy for a problem that was not cancer or a condition that could lead to cancer, then you no longer need Pap tests. Even if you no longer need a Pap test, a regular exam is a good idea to make sure no other problems are  starting.  If you are between ages 65 and 70, and you have had normal Pap tests going back 10 years, you no longer need Pap tests. Even if you no longer need a Pap test, a regular exam is a good idea to make sure no other problems are starting.  If you have had past treatment for cervical cancer or a condition that could lead to cancer, you need Pap tests and screening for cancer for at least 20 years after your treatment.  If Pap tests have been discontinued, risk factors (such as a new sexual partner) need to be reassessed to determine if screening should be resumed.  The HPV test is an additional test that may be used for cervical cancer screening. The HPV test looks for the virus that can cause the cell changes on the cervix. The cells collected during the Pap test can be tested for HPV. The HPV test could be used to screen women aged 30 years and older, and should   be used in women of any age who have unclear Pap test results. After the age of 30, women should have HPV testing at the same frequency as a Pap test.  Colorectal cancer can be detected and often prevented. Most routine colorectal cancer screening begins at the age of 50 and continues through age 75. However, your caregiver may recommend screening at an earlier age if you have risk factors for colon cancer. On a yearly basis, your caregiver may provide home test kits to check for hidden blood in the stool. Use of a small camera at the end of a tube, to directly examine the colon (sigmoidoscopy or colonoscopy), can detect the earliest forms of colorectal cancer. Talk to your caregiver about this at age 50, when routine screening begins. Direct examination of the colon should be repeated every 5 to 10 years through age 75, unless early forms of pre-cancerous polyps or small growths are found.  Hepatitis C blood testing is recommended for all people born from 1945 through 1965 and any individual with known risks for hepatitis C.  Practice  safe sex. Use condoms and avoid high-risk sexual practices to reduce the spread of sexually transmitted infections (STIs). STIs include gonorrhea, chlamydia, syphilis, trichomonas, herpes, HPV, and human immunodeficiency virus (HIV). Herpes, HIV, and HPV are viral illnesses that have no cure. They can result in disability, cancer, and death. Sexually active women aged 25 and younger should be checked for chlamydia. Older women with new or multiple partners should also be tested for chlamydia. Testing for other STIs is recommended if you are sexually active and at increased risk.  Osteoporosis is a disease in which the bones lose minerals and strength with aging. This can result in serious bone fractures. The risk of osteoporosis can be identified using a bone density scan. Women ages 65 and over and women at risk for fractures or osteoporosis should discuss screening with their caregivers. Ask your caregiver whether you should take a calcium supplement or vitamin D to reduce the rate of osteoporosis.  Menopause can be associated with physical symptoms and risks. Hormone replacement therapy is available to decrease symptoms and risks. You should talk to your caregiver about whether hormone replacement therapy is right for you.  Use sunscreen with sun protection factor (SPF) of 30 or more. Apply sunscreen liberally and repeatedly throughout the day. You should seek shade when your shadow is shorter than you. Protect yourself by wearing long sleeves, pants, a wide-brimmed hat, and sunglasses year round, whenever you are outdoors.  Once a month, do a whole body skin exam, using a mirror to look at the skin on your back. Notify your caregiver of new moles, moles that have irregular borders, moles that are larger than a pencil eraser, or moles that have changed in shape or color.  Stay current with required immunizations.  Influenza. You need a dose every fall (or winter). The composition of the flu vaccine  changes each year, so being vaccinated once is not enough.  Pneumococcal polysaccharide. You need 1 to 2 doses if you smoke cigarettes or if you have certain chronic medical conditions. You need 1 dose at age 65 (or older) if you have never been vaccinated.  Tetanus, diphtheria, pertussis (Tdap, Td). Get 1 dose of Tdap vaccine if you are younger than age 65, are over 65 and have contact with an infant, are a healthcare worker, are pregnant, or simply want to be protected from whooping cough. After that, you need a Td   booster dose every 10 years. Consult your caregiver if you have not had at least 3 tetanus and diphtheria-containing shots sometime in your life or have a deep or dirty wound.  HPV. You need this vaccine if you are a woman age 26 or younger. The vaccine is given in 3 doses over 6 months.  Measles, mumps, rubella (MMR). You need at least 1 dose of MMR if you were born in 1957 or later. You may also need a second dose.  Meningococcal. If you are age 19 to 21 and a first-year college student living in a residence hall, or have one of several medical conditions, you need to get vaccinated against meningococcal disease. You may also need additional booster doses.  Zoster (shingles). If you are age 60 or older, you should get this vaccine.  Varicella (chickenpox). If you have never had chickenpox or you were vaccinated but received only 1 dose, talk to your caregiver to find out if you need this vaccine.  Hepatitis A. You need this vaccine if you have a specific risk factor for hepatitis A virus infection or you simply wish to be protected from this disease. The vaccine is usually given as 2 doses, 6 to 18 months apart.  Hepatitis B. You need this vaccine if you have a specific risk factor for hepatitis B virus infection or you simply wish to be protected from this disease. The vaccine is given in 3 doses, usually over 6 months. Preventive Services / Frequency Ages 19 to 39  Blood  pressure check.** / Every 1 to 2 years.  Lipid and cholesterol check.** / Every 5 years beginning at age 20.  Clinical breast exam.** / Every 3 years for women in their 20s and 30s.  Pap test.** / Every 2 years from ages 21 through 29. Every 3 years starting at age 30 through age 65 or 70 with a history of 3 consecutive normal Pap tests.  HPV screening.** / Every 3 years from ages 30 through ages 65 to 70 with a history of 3 consecutive normal Pap tests.  Hepatitis C blood test.** / For any individual with known risks for hepatitis C.  Skin self-exam. / Monthly.  Influenza immunization.** / Every year.  Pneumococcal polysaccharide immunization.** / 1 to 2 doses if you smoke cigarettes or if you have certain chronic medical conditions.  Tetanus, diphtheria, pertussis (Tdap, Td) immunization. / A one-time dose of Tdap vaccine. After that, you need a Td booster dose every 10 years.  HPV immunization. / 3 doses over 6 months, if you are 26 and younger.  Measles, mumps, rubella (MMR) immunization. / You need at least 1 dose of MMR if you were born in 1957 or later. You may also need a second dose.  Meningococcal immunization. / 1 dose if you are age 19 to 21 and a first-year college student living in a residence hall, or have one of several medical conditions, you need to get vaccinated against meningococcal disease. You may also need additional booster doses.  Varicella immunization.** / Consult your caregiver.  Hepatitis A immunization.** / Consult your caregiver. 2 doses, 6 to 18 months apart.  Hepatitis B immunization.** / Consult your caregiver. 3 doses usually over 6 months. Ages 40 to 64  Blood pressure check.** / Every 1 to 2 years.  Lipid and cholesterol check.** / Every 5 years beginning at age 20.  Clinical breast exam.** / Every year after age 40.  Mammogram.** / Every year beginning at age 40   and continuing for as long as you are in good health. Consult with your  caregiver.  Pap test.** / Every 3 years starting at age 30 through age 65 or 70 with a history of 3 consecutive normal Pap tests.  HPV screening.** / Every 3 years from ages 30 through ages 65 to 70 with a history of 3 consecutive normal Pap tests.  Fecal occult blood test (FOBT) of stool. / Every year beginning at age 50 and continuing until age 75. You may not need to do this test if you get a colonoscopy every 10 years.  Flexible sigmoidoscopy or colonoscopy.** / Every 5 years for a flexible sigmoidoscopy or every 10 years for a colonoscopy beginning at age 50 and continuing until age 75.  Hepatitis C blood test.** / For all people born from 1945 through 1965 and any individual with known risks for hepatitis C.  Skin self-exam. / Monthly.  Influenza immunization.** / Every year.  Pneumococcal polysaccharide immunization.** / 1 to 2 doses if you smoke cigarettes or if you have certain chronic medical conditions.  Tetanus, diphtheria, pertussis (Tdap, Td) immunization.** / A one-time dose of Tdap vaccine. After that, you need a Td booster dose every 10 years.  Measles, mumps, rubella (MMR) immunization. / You need at least 1 dose of MMR if you were born in 1957 or later. You may also need a second dose.  Varicella immunization.** / Consult your caregiver.  Meningococcal immunization.** / Consult your caregiver.  Hepatitis A immunization.** / Consult your caregiver. 2 doses, 6 to 18 months apart.  Hepatitis B immunization.** / Consult your caregiver. 3 doses, usually over 6 months. Ages 65 and over  Blood pressure check.** / Every 1 to 2 years.  Lipid and cholesterol check.** / Every 5 years beginning at age 20.  Clinical breast exam.** / Every year after age 40.  Mammogram.** / Every year beginning at age 40 and continuing for as long as you are in good health. Consult with your caregiver.  Pap test.** / Every 3 years starting at age 30 through age 65 or 70 with a 3  consecutive normal Pap tests. Testing can be stopped between 65 and 70 with 3 consecutive normal Pap tests and no abnormal Pap or HPV tests in the past 10 years.  HPV screening.** / Every 3 years from ages 30 through ages 65 or 70 with a history of 3 consecutive normal Pap tests. Testing can be stopped between 65 and 70 with 3 consecutive normal Pap tests and no abnormal Pap or HPV tests in the past 10 years.  Fecal occult blood test (FOBT) of stool. / Every year beginning at age 50 and continuing until age 75. You may not need to do this test if you get a colonoscopy every 10 years.  Flexible sigmoidoscopy or colonoscopy.** / Every 5 years for a flexible sigmoidoscopy or every 10 years for a colonoscopy beginning at age 50 and continuing until age 75.  Hepatitis C blood test.** / For all people born from 1945 through 1965 and any individual with known risks for hepatitis C.  Osteoporosis screening.** / A one-time screening for women ages 65 and over and women at risk for fractures or osteoporosis.  Skin self-exam. / Monthly.  Influenza immunization.** / Every year.  Pneumococcal polysaccharide immunization.** / 1 dose at age 65 (or older) if you have never been vaccinated.  Tetanus, diphtheria, pertussis (Tdap, Td) immunization. / A one-time dose of Tdap vaccine if you are over   65 and have contact with an infant, are a healthcare worker, or simply want to be protected from whooping cough. After that, you need a Td booster dose every 10 years.  Varicella immunization.** / Consult your caregiver.  Meningococcal immunization.** / Consult your caregiver.  Hepatitis A immunization.** / Consult your caregiver. 2 doses, 6 to 18 months apart.  Hepatitis B immunization.** / Check with your caregiver. 3 doses, usually over 6 months. ** Family history and personal history of risk and conditions may change your caregiver's recommendations. Document Released: 11/20/2001 Document Revised: 12/17/2011  Document Reviewed: 02/19/2011 [employer] Patient Information 2014 [employer].  

## 2013-05-20 NOTE — Progress Notes (Signed)
  Subjective:     Destiny Henderson is a 52 y.o. female and is here for a comprehensive physical exam. The patient reports problems - has IUD for menorrhagia.  Having 2 cycles/month.  Some night sweats.  No real hot flashes.  Nml FSH and TSH 2 months ago.  History   Social History  . Marital Status: Single    Spouse Name: N/A    Number of Children: N/A  . Years of Education: N/A   Occupational History  . [employer] operational officer    Social History Main Topics  . Smoking status: Never Smoker   . Smokeless tobacco: Never Used  . Alcohol Use: No  . Drug Use: No  . Sexual Activity: No   Other Topics Concern  . Not on file   Social History Narrative  . No narrative on file   Health Maintenance  Topic Date Due  . Pap Smear  09/03/1980  . Tetanus/tdap  09/03/1981  . Colonoscopy  09/03/2012  . Influenza Vaccine  06/08/2013  . Mammogram  05/08/2015    The following portions of the patient's history were reviewed and updated as appropriate: allergies, current medications, past family history, past medical history, past social history, past surgical history and problem list.  Review of Systems Pertinent items are noted in HPI.   Objective:    BP 158/86  Pulse 63  Resp 16  Ht 5\' 5"  (1.651 m)  Wt 212 lb (96.163 kg)  BMI 35.28 kg/m2  LMP 04/14/2013 General appearance: alert, cooperative and appears stated age Head: Normocephalic, without obvious abnormality, atraumatic Neck: no adenopathy, supple, symmetrical, trachea midline and thyroid not enlarged, symmetric, no tenderness/mass/nodules Lungs: clear to auscultation bilaterally Breasts: normal appearance, no masses or tenderness Heart: regular rate and rhythm, S1, S2 normal, no murmur, click, rub or gallop Abdomen: soft, non-tender; bowel sounds normal; no masses,  no organomegaly Pelvic: cervix normal in appearance, external genitalia normal, no adnexal masses or tenderness, no cervical motion tenderness, vagina normal without  discharge and uterus is globular and about 8-10 wk size, it is not tender. Extremities: extremities normal, atraumatic, no cyanosis or edema Pulses: 2+ and symmetric Skin: Skin color, texture, turgor normal. No rashes or lesions Lymph nodes: Cervical, supraclavicular, and axillary nodes normal. Neurologic: Grossly normal    Assessment:    Healthy female exam. Menorrhagia with IUD in place.      Plan:    Pap smear Mammogram complete in 05/14. Check pelvic sono. Needs colonoscopy. Needs IUD changed in 05/2014.  See After Visit Summary for Counseling Recommendations

## 2013-05-21 ENCOUNTER — Ambulatory Visit (HOSPITAL_BASED_OUTPATIENT_CLINIC_OR_DEPARTMENT_OTHER)
Admission: RE | Admit: 2013-05-21 | Discharge: 2013-05-21 | Disposition: A | Discharge status: Home / Self Care | Payer: BC Managed Care – PPO | Source: Ambulatory Visit | Attending: Family Medicine | Admitting: Family Medicine

## 2013-05-21 DIAGNOSIS — D250 Submucous leiomyoma of uterus: Secondary | ICD-10-CM | POA: Insufficient documentation

## 2013-05-21 DIAGNOSIS — N920 Excessive and frequent menstruation with regular cycle: Secondary | ICD-10-CM

## 2013-05-21 DIAGNOSIS — N949 Unspecified condition associated with female genital organs and menstrual cycle: Secondary | ICD-10-CM | POA: Insufficient documentation

## 2013-05-21 DIAGNOSIS — Z975 Presence of (intrauterine) contraceptive device: Secondary | ICD-10-CM | POA: Insufficient documentation

## 2013-05-25 ENCOUNTER — Encounter: Payer: Self-pay | Admitting: Family Medicine

## 2013-05-25 ENCOUNTER — Ambulatory Visit (INDEPENDENT_AMBULATORY_CARE_PROVIDER_SITE_OTHER): Payer: BC Managed Care – PPO | Admitting: Family Medicine

## 2013-05-25 VITALS — BP 194/93 | HR 63 | Temp 98.9°F

## 2013-05-25 DIAGNOSIS — N390 Urinary tract infection, site not specified: Secondary | ICD-10-CM

## 2013-05-25 LAB — POCT URINALYSIS DIPSTICK
Bilirubin, UA: NEGATIVE
Ketones, UA: NEGATIVE
Leukocytes, UA: NEGATIVE
Nitrite, UA: NEGATIVE
pH, UA: 5.5

## 2013-05-25 MED ORDER — CIPROFLOXACIN HCL 250 MG PO TABS: ORAL_TABLET | ORAL | Status: AC

## 2013-05-25 NOTE — Addendum Note (Signed)
Addended by: Wyline Beady on: 05/25/2013 04:59 PM   Modules accepted: Orders

## 2013-05-25 NOTE — Progress Notes (Signed)
CC: Destiny Henderson is a 52 y.o. female is here for Urinary Tract Infection   Subjective: HPI:  Patient complains of dysuria of mild to moderate severity that has been present since late this weekend worsening on a daily basis. No other complaints. Pain only present when urinating localized near the urethra nonradiating. Nothing else seems to make better or worse no interventions as of yet no fevers, abdominal pain, nor nausea   Review Of Systems Outlined In HPI  Past Medical History  Diagnosis Date  . Fibroma of heart   . Essential hypertension, benign 03/03/2013  . Obesity 03/03/2013  . Anemia   . Benign tumor of bladder      Family History  Problem Relation Age of Onset  . Breast cancer Mother   . Hypertension Mother   . Hyperlipidemia Mother   . Cancer    . Thyroid disease Sister   . Hypertension Maternal Aunt   . Heart failure Maternal Aunt   . Hypertension Maternal Aunt   . Hypertension Maternal Uncle   . Colon cancer Father   . Diabetes Maternal Uncle   . Heart failure Maternal Grandmother   . Cancer Maternal Grandfather     prostate     History  Substance Use Topics  . Smoking status: Never Smoker   . Smokeless tobacco: Never Used  . Alcohol Use: No     Objective: Filed Vitals:   05/25/13 1642  BP: 194/93  Pulse: 63  Temp: 98.9 F (37.2 C)    Vital signs reviewed. General: Alert and Oriented, No Acute Distress HEENT: Pupils equal, round, reactive to light. Conjunctivae clear.  External ears unremarkable.  Moist mucous membranes. Lungs: Clear and comfortable work of breathing, speaking in full sentences without accessory muscle use. Cardiac: Regular rate and rhythm.  Extremities: No peripheral edema.  Strong peripheral pulses.  Mental Status: No depression, anxiety, nor agitation. Logical though process. Skin: Warm and dry.  Assessment & Plan: Destiny Henderson was seen today for urinary tract infection.  Diagnoses and associated orders for this  visit:  UTI (urinary tract infection) - ciprofloxacin (CIPRO) 250 MG tablet; Take one by mouth twice a day for five days.    There is small blood in the urine we will treat empirically with Cipro based on most recent cultures I will follow her culture obtained today. Notify me if not significantly improved in 48 hours.  Encouraged her to take blood pressure medication on a daily basis she reports blood pressure was 131 over 80 this morning  Return if symptoms worsen or fail to improve.

## 2013-05-27 LAB — URINE CULTURE: Organism ID, Bacteria: NO GROWTH

## 2013-05-28 ENCOUNTER — Telehealth: Payer: Self-pay | Admitting: *Deleted

## 2013-05-28 DIAGNOSIS — Z01812 Encounter for preprocedural laboratory examination: Secondary | ICD-10-CM

## 2013-05-28 MED ORDER — MISOPROSTOL 200 MCG PO TABS: ORAL_TABLET | ORAL | Status: DC

## 2013-05-28 NOTE — Telephone Encounter (Signed)
RX sent to pharmacy for Cytotec to be taken the night prior to procedure.  Pt will have IUD removed and replaced.

## 2013-06-09 ENCOUNTER — Encounter: Payer: Self-pay | Admitting: Obstetrics & Gynecology

## 2013-06-09 ENCOUNTER — Ambulatory Visit (INDEPENDENT_AMBULATORY_CARE_PROVIDER_SITE_OTHER): Payer: BC Managed Care – PPO | Admitting: Obstetrics & Gynecology

## 2013-06-09 VITALS — BP 153/94 | HR 72 | Resp 16 | Ht 65.0 in | Wt 212.0 lb

## 2013-06-09 DIAGNOSIS — IMO0001 Reserved for inherently not codable concepts without codable children: Secondary | ICD-10-CM

## 2013-06-09 DIAGNOSIS — Z01812 Encounter for preprocedural laboratory examination: Secondary | ICD-10-CM

## 2013-06-09 DIAGNOSIS — N920 Excessive and frequent menstruation with regular cycle: Secondary | ICD-10-CM

## 2013-06-09 DIAGNOSIS — Z30433 Encounter for removal and reinsertion of intrauterine contraceptive device: Secondary | ICD-10-CM

## 2013-06-09 MED ORDER — LEVONORGESTREL 20 MCG/24HR IU IUD
1.0000 | INTRAUTERINE_SYSTEM | Freq: Once | INTRAUTERINE | Status: AC
  Administered 2013-06-09: 1 via INTRAUTERINE

## 2013-06-09 NOTE — Progress Notes (Signed)
Pt is no longer having menorrhagia.  Pt has monthly spotting at the time of her menses.  Pt has premenopausal FSH.   GYNECOLOGY CLINIC PROCEDURE NOTE  Destiny Henderson is a 52 y.o. [phone] here for Mirena IUD removal and reinsertion. No GYN concerns.  Last pap smear was on 8/14 and was normal.  IUD Removal and Reinsertion  Patient identified, informed consent performed. Discussed risks of irregular bleeding, cramping, infection, malpositioning or misplacement of the IUD outside the uterus which may require further procedures. Time out was performed. Speculum placed in the vagina. Cervix visualized. Cleaned with Betadine x 2. Grasped anteriorly with a single tooth tenaculum. The strings of the IUD were grasped and pulled using ring forceps. The IUD was successfully removed in its entirety. Uterus sounded to 8.5 cm. Mirena IUD placed per manufacturer's recommendations. Strings trimmed to 3 cm. Tenaculum was removed, good hemostasis noted. Patient tolerated procedure well. Patient was given post-procedure instructions.  Patient was also asked to check IUD strings periodically and follow up in 6 weeks for IUD check.

## 2013-06-10 ENCOUNTER — Ambulatory Visit: Payer: BC Managed Care – PPO | Admitting: Obstetrics & Gynecology

## 2013-07-28 ENCOUNTER — Ambulatory Visit: Payer: BC Managed Care – PPO | Admitting: Obstetrics & Gynecology

## 2013-08-13 ENCOUNTER — Other Ambulatory Visit: Payer: Self-pay

## 2014-08-09 ENCOUNTER — Encounter: Payer: Self-pay | Admitting: Obstetrics & Gynecology

## 2016-12-12 DIAGNOSIS — S93401A Sprain of unspecified ligament of right ankle, initial encounter: Secondary | ICD-10-CM | POA: Insufficient documentation

## 2016-12-12 DIAGNOSIS — S83419A Sprain of medial collateral ligament of unspecified knee, initial encounter: Secondary | ICD-10-CM

## 2016-12-12 HISTORY — DX: Sprain of medial collateral ligament of unspecified knee, initial encounter: S83.419A

## 2016-12-12 HISTORY — DX: Sprain of unspecified ligament of right ankle, initial encounter: S93.401A

## 2017-04-18 DIAGNOSIS — R9431 Abnormal electrocardiogram [ECG] [EKG]: Secondary | ICD-10-CM | POA: Diagnosis not present

## 2017-04-18 DIAGNOSIS — I509 Heart failure, unspecified: Secondary | ICD-10-CM | POA: Diagnosis not present

## 2017-06-09 ENCOUNTER — Encounter (HOSPITAL_BASED_OUTPATIENT_CLINIC_OR_DEPARTMENT_OTHER): Payer: Self-pay | Admitting: Emergency Medicine

## 2017-06-09 ENCOUNTER — Emergency Department (HOSPITAL_BASED_OUTPATIENT_CLINIC_OR_DEPARTMENT_OTHER): Payer: Self-pay

## 2017-06-09 ENCOUNTER — Emergency Department (HOSPITAL_BASED_OUTPATIENT_CLINIC_OR_DEPARTMENT_OTHER)
Admission: EM | Admit: 2017-06-09 | Discharge: 2017-06-09 | Disposition: A | Discharge status: Home / Self Care | Payer: Self-pay | Attending: Emergency Medicine | Admitting: Emergency Medicine

## 2017-06-09 DIAGNOSIS — R600 Localized edema: Secondary | ICD-10-CM

## 2017-06-09 DIAGNOSIS — Z91040 Latex allergy status: Secondary | ICD-10-CM | POA: Insufficient documentation

## 2017-06-09 DIAGNOSIS — R079 Chest pain, unspecified: Secondary | ICD-10-CM | POA: Insufficient documentation

## 2017-06-09 DIAGNOSIS — Z79899 Other long term (current) drug therapy: Secondary | ICD-10-CM | POA: Insufficient documentation

## 2017-06-09 DIAGNOSIS — R0789 Other chest pain: Secondary | ICD-10-CM

## 2017-06-09 DIAGNOSIS — I10 Essential (primary) hypertension: Secondary | ICD-10-CM | POA: Insufficient documentation

## 2017-06-09 HISTORY — DX: Acute embolism and thrombosis of unspecified deep veins of unspecified lower extremity: I82.409

## 2017-06-09 LAB — BASIC METABOLIC PANEL
ANION GAP: 8 (ref 5–15)
BUN: 10 mg/dL (ref 6–20)
CALCIUM: 9 mg/dL (ref 8.9–10.3)
CO2: 27 mmol/L (ref 22–32)
Chloride: 105 mmol/L (ref 101–111)
Creatinine, Ser: 0.99 mg/dL (ref 0.44–1.00)
Glucose, Bld: 84 mg/dL (ref 65–99)
POTASSIUM: 3.7 mmol/L (ref 3.5–5.1)
Sodium: 140 mmol/L (ref 135–145)

## 2017-06-09 LAB — URINALYSIS, ROUTINE W REFLEX MICROSCOPIC
Bilirubin Urine: NEGATIVE
Glucose, UA: NEGATIVE mg/dL
HGB URINE DIPSTICK: NEGATIVE
Ketones, ur: NEGATIVE mg/dL
LEUKOCYTES UA: NEGATIVE
NITRITE: NEGATIVE
PROTEIN: NEGATIVE mg/dL
SPECIFIC GRAVITY, URINE: 1.015 (ref 1.005–1.030)
pH: 6.5 (ref 5.0–8.0)

## 2017-06-09 LAB — CBC
HCT: 36.3 % (ref 36.0–46.0)
HEMOGLOBIN: 12 g/dL (ref 12.0–15.0)
MCH: 29 pg (ref 26.0–34.0)
MCHC: 33.1 g/dL (ref 30.0–36.0)
MCV: 87.7 fL (ref 78.0–100.0)
Platelets: 354 10*3/uL (ref 150–400)
RBC: 4.14 MIL/uL (ref 3.87–5.11)
RDW: 14.2 % (ref 11.5–15.5)
WBC: 6.4 10*3/uL (ref 4.0–10.5)

## 2017-06-09 LAB — BRAIN NATRIURETIC PEPTIDE: B NATRIURETIC PEPTIDE 5: 55.8 pg/mL (ref 0.0–100.0)

## 2017-06-09 LAB — TROPONIN I: Troponin I: <0.03 ng/mL (ref <0.03)

## 2017-06-09 MED ORDER — IOPAMIDOL (ISOVUE-370) INJECTION 76%
100.0000 mL | Freq: Once | INTRAVENOUS | Status: AC | PRN
  Administered 2017-06-09: 100 mL via INTRAVENOUS

## 2017-06-09 MED ORDER — FUROSEMIDE 40 MG PO TABS: 40.0000 mg | ORAL_TABLET | Freq: Every day | ORAL | 0 refills | Status: DC

## 2017-06-09 NOTE — Discharge Instructions (Signed)
Your CT chest is normal. There is no blood clot. Your heart work-up is reassuring.   Please arrange follow-up with primary care doctor.   Please return for worsening symptoms, including passing out, difficulty breathing, worsening pain or any other symptoms concerning to you.  Continue with leg elevated and compression socks.

## 2017-06-09 NOTE — ED Notes (Signed)
Pt states she has a hx of blood clot in her left leg.

## 2017-06-09 NOTE — ED Provider Notes (Signed)
[facility] Provider Note   CSN: [id number] Arrival date & time: 06/09/17  1228     History   Chief Complaint Chief Complaint  Patient presents with  . Chest Pain    HPI Destiny Henderson is a 56 y.o. female.  HPI 57 year old female who presents with lower extremity swelling left greater than right. This is been going on for several weeks. Recently moved to [place] from [place] to be with family. 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 States that she has tried multiple diuretics in the past without improvement in the swelling of her legs, but has been gradually worsening over the past few weeks with pain in the left calf. Has had shortness of breath with activity that has been ongoing for a while now even before she moved from [place]. Has been followed by her previous primary care doctors. Yesterday noted some intermittent left-sided chest aching, that was not radiating. It was not pleuritic. It did not worsen with activity. It was not associated with diaphoresis, nausea or vomiting, shortness of breath, syncope or near syncope. She denies any chest pain. Denies cough, congestion, fevers or chills.   Past Medical History:  Diagnosis Date  . Anemia   . Benign tumor of bladder   . DVT (deep venous thrombosis) ([place])   . Essential hypertension, benign 03/03/2013  . Fibroma of heart   . Obesity 03/03/2013    Patient Active Problem List   Diagnosis Date Noted  . Menorrhagia 05/20/2013  . Essential hypertension, benign 03/03/2013  . Obesity 03/03/2013    Past Surgical History:  Procedure Laterality Date  . BACK SURGERY    . bladder tack    . DILATION AND CURETTAGE OF UTERUS     after SAB  . HEART TUMOR EXCISION      OB History    Gravida Para Term Preterm AB Living   4 3 3   1 3    SAB TAB Ectopic Multiple Live Births   1               Home Medications    Prior to Admission medications   Medication Sig  Start Date End Date Taking? Authorizing Provider  losartan (COZAAR) 25 MG tablet Take 25 mg by mouth daily.   Yes [provider]  b complex vitamins capsule Take 1 capsule by mouth daily.    [provider]  Coenzyme Q10 (CO Q 10 PO) Take by mouth.    [provider]  ECHINACEA PO Take by mouth.    [provider]  fish oil-omega-3 fatty acids 1000 MG capsule Take 2 g by mouth daily.    [provider]  FLAXSEED, LINSEED, PO Take by mouth.    [provider]  lisinopril-hydrochlorothiazide (PRINZIDE,ZESTORETIC) 20-25 MG per tablet Take 1 tablet by mouth daily. 05/11/13   Marcial Pacas, DO  misoprostol (CYTOTEC) 200 MCG tablet Take 400 mcg  PO the night before procedure. 05/28/13   Guss Bunde, MD  Multiple Vitamins-Minerals (MEGA MULTIVITAMIN FOR WOMEN) TABS Take by mouth.    [provider]  phentermine 37.5 MG capsule Take 1 capsule (37.5 mg total) by mouth every morning. 05/11/13   Hommel, Sean, DO  psyllium (METAMUCIL SMOOTH TEXTURE) 28 % packet Take 1 packet by mouth 2 (two) times daily. PRN Constipation. 05/11/13   Marcial Pacas, DO  VITAMIN A OP Apply to eye.    [provider]  vitamin C (ASCORBIC ACID) 500 MG tablet Take 500 mg by mouth daily.    [provider]  VITAMIN D, CHOLECALCIFEROL, PO Take by mouth.    [provider]    Family History Family History  Problem Relation Age of Onset  . Breast cancer Mother   . Hypertension Mother   . Hyperlipidemia Mother   . Colon cancer Father   . Cancer Unknown   . Thyroid disease Sister   . Hypertension Maternal Aunt   . Heart failure Maternal Aunt   . Hypertension Maternal Aunt   . Hypertension Maternal Uncle   . Diabetes Maternal Uncle   . Heart failure Maternal Grandmother   . Cancer Maternal Grandfather        prostate    Social History Social History  Substance Use Topics  . Smoking status: Never Smoker  . Smokeless tobacco: Never  Used  . Alcohol use No     Allergies   Latex and Sulfa antibiotics   Review of Systems Review of Systems  Constitutional: Negative for fever.  Cardiovascular: Positive for leg swelling.  Gastrointestinal: Negative for abdominal pain.  All other systems reviewed and are negative.    Physical Exam Updated Vital Signs BP (!) 155/78 (BP Location: Left Arm)   Pulse 65   Temp 98.8 F (37.1 C) (Oral)   Resp 18   Ht 5\' 4"  (1.626 m)   Wt 95.3 kg (210 lb)   SpO2 100%   BMI 36.05 kg/m   Physical Exam Physical Exam  Nursing note and vitals reviewed. Constitutional: Well developed, well nourished, non-toxic, and in no acute distress Head: Normocephalic and atraumatic.  Mouth/Throat: Oropharynx is clear and moist.  Neck: Normal range of motion. Neck supple.  Cardiovascular: Normal rate and regular rhythm.   Pulmonary/Chest: Effort normal and breath sounds normal.  Abdominal: Soft. There is no tenderness. There is no rebound and no guarding.  Musculoskeletal: Normal range of motion. Mild LE edema bilaterally. No significant calf tenderness to palpation Neurological: Alert, no facial droop, fluent speech, moves all extremities symmetrically Skin: Skin is warm and dry.  Psychiatric: Cooperative   ED Treatments / Results  Labs (all labs ordered are listed, but only abnormal results are displayed) Labs Reviewed  URINALYSIS, ROUTINE W REFLEX MICROSCOPIC - Abnormal; Notable for the following:       Result Value   APPearance HAZY (*)    All other components within normal limits  CBC  BASIC METABOLIC PANEL  TROPONIN I  BRAIN NATRIURETIC PEPTIDE    EKG  EKG Interpretation None       Radiology Dg Chest 2 View  Result Date: 06/09/2017 CLINICAL DATA:  Intermittent chest pain for 3-4 days with swelling in the legs. EXAM: CHEST  2 VIEW COMPARISON:  04/21/2013 FINDINGS: Prior median sternotomy. Cardiac and mediastinal margins appear normal. The lungs appear clear. No pleural  effusion. IMPRESSION: 1.  No significant abnormality identified. Electronically Signed   By: Van Clines M.D.   On: 06/09/2017 13:09    Procedures Procedures (including critical care time)  Medications Ordered in ED Medications - No data to display   Initial Impression / Assessment and Plan / ED Course  I have reviewed the triage vital signs and the nursing notes.  Pertinent labs & imaging results that were available during my care of the patient were reviewed by me and considered in my medical decision making (see chart for details).     History of DVT with intermittent chest  pain yesterday, and LE edema left > right for a few weeks. Is well appearing with normal vitals. Mild edema noted in LE and feet. Remainder of exam unremarkable. Dyspnea on exertion seems chronic and unchanged, before she moved from [place]. Chest pain atypical for that of PE or ACS. However, given recent travel, left > right leg swelling and history of DVT, she has a high risk well's score. Will obtain CTA chest to rule out PE and LE [name] to rule out DVT.   EKG is not ischemic. Troponin normal. BNP normal. CXR visualized and without infiltrate, edema or other acute cardiopulmonary processes. Presentation concerning for CHF. Low risk for ACS. Will obtain serial troponin  [name] LE and repeat troponin signed out to Dr. Maryan Rued. Plan for d/c if work-up reassuring with short course of diuretics.  Final Clinical Impressions(s) / ED Diagnoses   Final diagnoses:  None    New Prescriptions New Prescriptions   No medications on file     Forde Dandy, MD 06/12/17 [phone]

## 2017-06-09 NOTE — ED Triage Notes (Signed)
L side Chest pain since yesterday, non radiating. Also reports bilateral ankle swelling for a few weeks with SOB on exertion. Pt with hx of DVT

## 2017-06-10 NOTE — ED Provider Notes (Signed)
Doppler study is negative and repeat troponin is within normal limits. Patient was sent home with a short course of diuretics   Blanchie Dessert, MD 06/10/17 [phone]

## 2017-07-13 ENCOUNTER — Emergency Department (HOSPITAL_BASED_OUTPATIENT_CLINIC_OR_DEPARTMENT_OTHER)
Admission: EM | Admit: 2017-07-13 | Discharge: 2017-07-13 | Disposition: A | Discharge status: Home / Self Care | Payer: Self-pay | Attending: Emergency Medicine | Admitting: Emergency Medicine

## 2017-07-13 ENCOUNTER — Emergency Department (HOSPITAL_BASED_OUTPATIENT_CLINIC_OR_DEPARTMENT_OTHER): Payer: Self-pay

## 2017-07-13 ENCOUNTER — Encounter (HOSPITAL_BASED_OUTPATIENT_CLINIC_OR_DEPARTMENT_OTHER): Payer: Self-pay | Admitting: *Deleted

## 2017-07-13 DIAGNOSIS — E876 Hypokalemia: Secondary | ICD-10-CM | POA: Insufficient documentation

## 2017-07-13 DIAGNOSIS — R2243 Localized swelling, mass and lump, lower limb, bilateral: Secondary | ICD-10-CM | POA: Insufficient documentation

## 2017-07-13 DIAGNOSIS — I10 Essential (primary) hypertension: Secondary | ICD-10-CM | POA: Insufficient documentation

## 2017-07-13 DIAGNOSIS — R600 Localized edema: Secondary | ICD-10-CM

## 2017-07-13 DIAGNOSIS — M25562 Pain in left knee: Secondary | ICD-10-CM | POA: Insufficient documentation

## 2017-07-13 LAB — BASIC METABOLIC PANEL
Anion gap: 8 (ref 5–15)
BUN: 13 mg/dL (ref 6–20)
CHLORIDE: 105 mmol/L (ref 101–111)
CO2: 25 mmol/L (ref 22–32)
CREATININE: 0.85 mg/dL (ref 0.44–1.00)
Calcium: 8.9 mg/dL (ref 8.9–10.3)
GFR calc non Af Amer: >60 mL/min (ref >60)
Glucose, Bld: 143 mg/dL — ABNORMAL HIGH (ref 65–99)
POTASSIUM: 3.4 mmol/L — AB (ref 3.5–5.1)
SODIUM: 138 mmol/L (ref 135–145)

## 2017-07-13 LAB — URINALYSIS, ROUTINE W REFLEX MICROSCOPIC
Bilirubin Urine: NEGATIVE
Glucose, UA: NEGATIVE mg/dL
Ketones, ur: NEGATIVE mg/dL
LEUKOCYTES UA: NEGATIVE
NITRITE: NEGATIVE
PH: 6 (ref 5.0–8.0)
Protein, ur: NEGATIVE mg/dL
SPECIFIC GRAVITY, URINE: 1.025 (ref 1.005–1.030)

## 2017-07-13 LAB — PREGNANCY, URINE: Preg Test, Ur: NEGATIVE

## 2017-07-13 LAB — CBC
HCT: 37 % (ref 36.0–46.0)
Hemoglobin: 12.3 g/dL (ref 12.0–15.0)
MCH: 29 pg (ref 26.0–34.0)
MCHC: 33.2 g/dL (ref 30.0–36.0)
MCV: 87.3 fL (ref 78.0–100.0)
PLATELETS: 330 10*3/uL (ref 150–400)
RBC: 4.24 MIL/uL (ref 3.87–5.11)
RDW: 14.9 % (ref 11.5–15.5)
WBC: 8 10*3/uL (ref 4.0–10.5)

## 2017-07-13 LAB — URINALYSIS, MICROSCOPIC (REFLEX)

## 2017-07-13 MED ORDER — IBUPROFEN 400 MG PO TABS
400.0000 mg | ORAL_TABLET | Freq: Once | ORAL | Status: AC
Start: 2017-07-13 — End: 2017-07-13
  Administered 2017-07-13: 400 mg via ORAL
  Filled 2017-07-13: qty 1

## 2017-07-13 MED ORDER — POTASSIUM CHLORIDE CRYS ER 20 MEQ PO TBCR
40.0000 meq | EXTENDED_RELEASE_TABLET | Freq: Once | ORAL | Status: AC
  Administered 2017-07-13: 40 meq via ORAL
  Filled 2017-07-13: qty 2

## 2017-07-13 NOTE — ED Provider Notes (Signed)
[facility] Provider Note   CSN: [id number] Arrival date & time: 07/13/17  1510     History   Chief Complaint Chief Complaint  Patient presents with  . Joint Swelling  . Near Syncope    HPI Destiny Henderson is a 56 y.o. female.  Patient c/o feeling lightheaded at work today. Nauseated.  Also states bilateral foot/leg swelling for several weeks.  Symptoms constant, persistent, moderate, hx same. States compliant w normal meds. No orthopnea/pnd. Normal urine output. Also c/o left knee pain, moderate, anterior. Denies injury.  Pt also c/o recent non prod cough. No sore throat or runny nose. No fever or chills.    The history is provided by the patient.  Near Syncope  Pertinent negatives include no chest pain, no headaches and no shortness of breath.    Past Medical History:  Diagnosis Date  . Anemia   . Benign tumor of bladder   . DVT (deep venous thrombosis) ([place])   . Essential hypertension, benign 03/03/2013  . Fibroma of heart   . Obesity 03/03/2013    Patient Active Problem List   Diagnosis Date Noted  . Menorrhagia 05/20/2013  . Essential hypertension, benign 03/03/2013  . Obesity 03/03/2013    Past Surgical History:  Procedure Laterality Date  . BACK SURGERY    . bladder tack    . DILATION AND CURETTAGE OF UTERUS     after SAB  . HEART TUMOR EXCISION      OB History    Gravida Para Term Preterm AB Living   4 3 3   1 3    SAB TAB Ectopic Multiple Live Births   1               Home Medications    Prior to Admission medications   Medication Sig Start Date End Date Taking? Authorizing Provider  b complex vitamins capsule Take 1 capsule by mouth daily.   Yes [provider]  Coenzyme Q10 (CO Q 10 PO) Take by mouth.   Yes [provider]  ECHINACEA PO Take by mouth.   Yes [provider]  fish oil-omega-3 fatty acids 1000 MG capsule Take 2 g by mouth daily.   Yes [provider]  FLAXSEED, LINSEED, PO Take  by mouth.   Yes [provider]  furosemide (LASIX) 40 MG tablet Take 1 tablet (40 mg total) by mouth daily. 06/09/17  Yes Forde Dandy, MD  Melatonin 5 MG CAPS Take by mouth.   Yes [provider]  Multiple Vitamins-Minerals (MEGA MULTIVITAMIN FOR WOMEN) TABS Take by mouth.   Yes [provider]  VITAMIN A OP Apply to eye.   Yes [provider]  vitamin C (ASCORBIC ACID) 500 MG tablet Take 500 mg by mouth daily.   Yes [provider]  VITAMIN D, CHOLECALCIFEROL, PO Take by mouth.   Yes [provider]  lisinopril-hydrochlorothiazide (PRINZIDE,ZESTORETIC) 20-25 MG per tablet Take 1 tablet by mouth daily. 05/11/13   Hommel, Hilliard Clark, DO  losartan (COZAAR) 25 MG tablet Take 25 mg by mouth daily.    [provider]  misoprostol (CYTOTEC) 200 MCG tablet Take 400 mcg  PO the night before procedure. 05/28/13   Guss Bunde, MD  phentermine 37.5 MG capsule Take 1 capsule (37.5 mg total) by mouth every morning. 05/11/13   Hommel, Sean, DO  psyllium (METAMUCIL SMOOTH TEXTURE) 28 % packet Take 1 packet by mouth 2 (two) times daily. PRN Constipation. 05/11/13  Marcial Pacas, DO    Family History Family History  Problem Relation Age of Onset  . Breast cancer Mother   . Hypertension Mother   . Hyperlipidemia Mother   . Colon cancer Father   . Cancer Unknown   . Thyroid disease Sister   . Hypertension Maternal Aunt   . Heart failure Maternal Aunt   . Hypertension Maternal Aunt   . Hypertension Maternal Uncle   . Diabetes Maternal Uncle   . Heart failure Maternal Grandmother   . Cancer Maternal Grandfather        prostate    Social History Social History  Substance Use Topics  . Smoking status: Never Smoker  . Smokeless tobacco: Never Used  . Alcohol use No     Allergies   Latex and Sulfa antibiotics   Review of Systems Review of Systems  Constitutional: Negative for fever.  HENT: Negative for sore throat.   Eyes: Negative for  redness.  Respiratory: Positive for cough. Negative for shortness of breath.   Cardiovascular: Positive for leg swelling and near-syncope. Negative for chest pain.  Gastrointestinal: Negative for vomiting.  Genitourinary: Negative for flank pain.  Musculoskeletal: Negative for back pain.  Skin: Negative for rash.  Neurological: Negative for headaches.  Hematological: Does not bruise/bleed easily.  Psychiatric/Behavioral: Negative for confusion.     Physical Exam Updated Vital Signs BP (!) 147/88 (BP Location: Left Arm)   Pulse 74   Temp 98.8 F (37.1 C) (Oral)   Resp 20   Ht 1.626 m (5\' 4" )   Wt 104.3 kg (230 lb)   SpO2 98%   BMI 39.48 kg/m   Physical Exam  Constitutional: She appears well-developed and well-nourished. No distress.  HENT:  Mouth/Throat: Oropharynx is clear and moist.  Eyes: Conjunctivae are normal. No scleral icterus.  Neck: Neck supple. No JVD present. No tracheal deviation present.  Cardiovascular: Normal rate, regular rhythm, normal heart sounds and intact distal pulses.  Exam reveals no gallop and no friction rub.   No murmur heard. Pulmonary/Chest: Effort normal and breath sounds normal. No respiratory distress.  Abdominal: Soft. Normal appearance and bowel sounds are normal. She exhibits no distension. There is no tenderness.  Genitourinary:  Genitourinary Comments: No cva tenderness  Musculoskeletal:  Mild bilateral ankle/lower leg edema. Distal pulses palp. No calf pain or tenderness. Left knee tender anteriorly. No effusion or sts. Knee stable. No pain w rom.   Neurological: She is alert.  Skin: Skin is warm and dry. No rash noted. She is not diaphoretic.  Psychiatric: She has a normal mood and affect.  Nursing note and vitals reviewed.    ED Treatments / Results  Labs (all labs ordered are listed, but only abnormal results are displayed) Results for orders placed or performed during the hospital encounter of 07/13/17  Pregnancy, urine    Result Value Ref Range   Preg Test, Ur NEGATIVE NEGATIVE  Basic metabolic panel  Result Value Ref Range   Sodium 138 135 - 145 mmol/L   Potassium 3.4 (L) 3.5 - 5.1 mmol/L   Chloride 105 101 - 111 mmol/L   CO2 25 22 - 32 mmol/L   Glucose, Bld 143 (H) 65 - 99 mg/dL   BUN 13 6 - 20 mg/dL   Creatinine, Ser 0.85 0.44 - 1.00 mg/dL   Calcium 8.9 8.9 - 10.3 mg/dL   GFR calc non Af Amer >60 >60 mL/min   GFR calc Af Amer >60 >60 mL/min   Anion gap 8  5 - 15  CBC  Result Value Ref Range   WBC 8.0 4.0 - 10.5 K/uL   RBC 4.24 3.87 - 5.11 MIL/uL   Hemoglobin 12.3 12.0 - 15.0 g/dL   HCT 37.0 36.0 - 46.0 %   MCV 87.3 78.0 - 100.0 fL   MCH 29.0 26.0 - 34.0 pg   MCHC 33.2 30.0 - 36.0 g/dL   RDW 14.9 11.5 - 15.5 %   Platelets 330 150 - 400 K/uL    EKG  EKG Interpretation  Date/Time:  Saturday July 13 2017 15:46:17 EDT Ventricular Rate:  82 PR Interval:    QRS Duration: 87 QT Interval:  538 QTC Calculation: 629 R Axis:   -24 Text Interpretation:  Sinus rhythm Left ventricular hypertrophy Nonspecific T wave abnormality No significant change since last tracing Confirmed by Lajean Saver [phone]) on 07/13/2017 4:03:09 PM       Radiology Dg Chest 2 View  Result Date: 07/13/2017 CLINICAL DATA:  Shortness of breath for 2-3 days EXAM: CHEST  2 VIEW COMPARISON:  CT chest 06/09/2017 FINDINGS: There is no focal parenchymal opacity. There is no pleural effusion or pneumothorax. The heart and mediastinal contours are unremarkable. There is evidence of prior median sternotomy. The osseous structures are unremarkable. IMPRESSION: No active cardiopulmonary disease. Electronically Signed   By: Kathreen Devoid   On: 07/13/2017 16:29   Dg Knee Complete 4 Views Left  Result Date: 07/13/2017 CLINICAL DATA:  Left knee pain and swelling.  No reported injury. EXAM: LEFT KNEE - COMPLETE 4+ VIEW COMPARISON:  None. FINDINGS: Trace suprapatellar left knee joint effusion. Focal subchondral lucency in the posterior  articular surface of the distal left femoral condyle on the lateral view, favor the medial condyle, without articular collapse. Otherwise no fracture. No dislocation. No suspicious focal osseous lesion. Mild medial compartment osteoarthritis. No radiopaque foreign body. IMPRESSION: 1. Focal subchondral lucency in the posterior articular surface of the distal left femoral condyle, favor the medial condyle, cannot exclude an osteochondral lesion in this location. Consider further evaluation with left knee MRI as clinically warranted. 2. Trace suprapatellar left knee joint effusion. 3. Mild medial compartment left knee osteoarthritis. Electronically Signed   By: Ilona Sorrel M.D.   On: 07/13/2017 16:26    Procedures Procedures (including critical care time)  Medications Ordered in ED Medications - No data to display   Initial Impression / Assessment and Plan / ED Course  I have reviewed the triage vital signs and the nursing notes.  Pertinent labs & imaging results that were available during my care of the patient were reviewed by me and considered in my medical decision making (see chart for details).  Iv ns. Labs. Cxr.  Reviewed nursing notes and prior charts for additional history.   Pt drove self. Motrin po. k sl low, kcl po.  Recheck, pt comfortable appearing, nad.   Pt appear stable for d/c.     Final Clinical Impressions(s) / ED Diagnoses   Final diagnoses:  None    New Prescriptions New Prescriptions   No medications on file     Lajean Saver, MD 07/13/17 1635

## 2017-07-13 NOTE — ED Notes (Signed)
ED Provider at bedside. 

## 2017-07-13 NOTE — Discharge Instructions (Signed)
It was our pleasure to provide your ER care today - we hope that you feel better.  Your knee xrays were read as: 1. Focal subchondral lucency in the posterior articular surface of the distal left femoral condyle, favor the medial condyle, cannot exclude an osteochondral lesion in this location. Consider further evaluation with left knee MRI as clinically warranted. 2. Trace suprapatellar left knee joint effusion. 3. Mild medial compartment left knee osteoarthritis.  Take motrin or aleve as need for pain.  Follow up with primary care doctor in the next couple weeks - discuss above xray result, and have them arrange further outpatient imaging if knee pain persists.   For swelling, continue your meds, elevate legs, and limit salt intake - follow up with primary care doctor.   Your potassium level is slightly low today (3.4) - eat plenty of fruits and vegetables, and follow up with primary care doctor.

## 2017-07-13 NOTE — ED Notes (Signed)
Patient in Xray

## 2017-07-13 NOTE — ED Notes (Signed)
PT states she had to put her life on hold and  move back here form [place] to take care of a family member and is tearful while talking.

## 2017-07-13 NOTE — ED Triage Notes (Signed)
Pt also reports urinary frequency.

## 2017-07-13 NOTE — ED Triage Notes (Signed)
Pt reports near-syncopal episode this morning with associated nausea. Denies LOC, fall. Denies v/d. Reports L knee swelling since Monday with difficulty walking. Denies known injury. Reports intermittent chest pain and sob (none at this time).

## 2017-07-16 ENCOUNTER — Encounter (HOSPITAL_BASED_OUTPATIENT_CLINIC_OR_DEPARTMENT_OTHER): Payer: Self-pay | Admitting: Emergency Medicine

## 2017-07-16 ENCOUNTER — Emergency Department (HOSPITAL_BASED_OUTPATIENT_CLINIC_OR_DEPARTMENT_OTHER)
Admission: EM | Admit: 2017-07-16 | Discharge: 2017-07-16 | Disposition: A | Discharge status: Home / Self Care | Payer: Self-pay | Attending: Emergency Medicine | Admitting: Emergency Medicine

## 2017-07-16 DIAGNOSIS — Z79899 Other long term (current) drug therapy: Secondary | ICD-10-CM | POA: Insufficient documentation

## 2017-07-16 DIAGNOSIS — T783XXA Angioneurotic edema, initial encounter: Secondary | ICD-10-CM | POA: Insufficient documentation

## 2017-07-16 DIAGNOSIS — I10 Essential (primary) hypertension: Secondary | ICD-10-CM | POA: Insufficient documentation

## 2017-07-16 MED ORDER — PREDNISONE 50 MG PO TABS
60.0000 mg | ORAL_TABLET | Freq: Once | ORAL | Status: AC
  Administered 2017-07-16: 60 mg via ORAL
  Filled 2017-07-16: qty 1

## 2017-07-16 MED ORDER — FAMOTIDINE 20 MG PO TABS
20.0000 mg | ORAL_TABLET | Freq: Two times a day (BID) | ORAL | 0 refills | Status: DC
Start: 2017-07-16 — End: 2017-11-12

## 2017-07-16 MED ORDER — PREDNISONE 10 MG PO TABS: 40.0000 mg | ORAL_TABLET | Freq: Every day | ORAL | 0 refills | Status: DC

## 2017-07-16 MED ORDER — FAMOTIDINE 20 MG PO TABS
20.0000 mg | ORAL_TABLET | Freq: Once | ORAL | Status: AC
Start: 2017-07-16 — End: 2017-07-16
  Administered 2017-07-16: 20 mg via ORAL
  Filled 2017-07-16: qty 1

## 2017-07-16 NOTE — ED Provider Notes (Signed)
[facility] DEPT [facility] Provider Note   CSN: [id number] Arrival date & time: 07/16/17  0714     History   Chief Complaint Chief Complaint  Patient presents with  . Allergic Reaction    HPI Destiny Henderson is a 56 y.o. female.  Patient recently moved back to the area from [place]. Patient's blood pressure medicine still being managed by her primary care doctor in [place]. Patient just recently restarted on lisinopril hydrochlorothiazide. Patient had been on it many years ago without any difficulties. Patient started the medicine on Saturday. Patient with development of left upper lip swelling that occurred this morning. Tongue felt thick temporarily but now feels normal. No breathing problems no rash or hives. Patient was recently seen on October 6, for left knee pain and swelling. Doppler studies at that time were negative. X-ray showed some evidence of arthritis. Knee is still swollen and painful.      Past Medical History:  Diagnosis Date  . Anemia   . Benign tumor of bladder   . DVT (deep venous thrombosis) ([place])   . Essential hypertension, benign 03/03/2013  . Fibroma of heart   . Obesity 03/03/2013    Patient Active Problem List   Diagnosis Date Noted  . Menorrhagia 05/20/2013  . Essential hypertension, benign 03/03/2013  . Obesity 03/03/2013    Past Surgical History:  Procedure Laterality Date  . BACK SURGERY    . bladder tack    . DILATION AND CURETTAGE OF UTERUS     after SAB  . HEART TUMOR EXCISION      OB History    Gravida Para Term Preterm AB Living   4 3 3   1 3    SAB TAB Ectopic Multiple Live Births   1               Home Medications    Prior to Admission medications   Medication Sig Start Date End Date Taking? Authorizing Provider  furosemide (LASIX) 40 MG tablet Take 1 tablet (40 mg total) by mouth daily. 06/09/17  Yes Forde Dandy, MD  lisinopril-hydrochlorothiazide (PRINZIDE,ZESTORETIC) 20-25 MG per tablet Take 1 tablet by mouth daily.  05/11/13  Yes Hommel, Sean, DO  misoprostol (CYTOTEC) 200 MCG tablet Take 400 mcg  PO the night before procedure. 05/28/13  Yes Guss Bunde, MD  psyllium (METAMUCIL SMOOTH TEXTURE) 28 % packet Take 1 packet by mouth 2 (two) times daily. PRN Constipation. 05/11/13  Yes Hommel, Sean, DO  b complex vitamins capsule Take 1 capsule by mouth daily.    [provider]  Coenzyme Q10 (CO Q 10 PO) Take by mouth.    [provider]  ECHINACEA PO Take by mouth.    [provider]  famotidine (PEPCID) 20 MG tablet Take 1 tablet (20 mg total) by mouth 2 (two) times daily. 07/16/17   Fredia Sorrow, MD  fish oil-omega-3 fatty acids 1000 MG capsule Take 2 g by mouth daily.    [provider]  FLAXSEED, LINSEED, PO Take by mouth.    [provider]  losartan (COZAAR) 25 MG tablet Take 25 mg by mouth daily.    [provider]  Melatonin 5 MG CAPS Take by mouth.    [provider]  Multiple Vitamins-Minerals (MEGA MULTIVITAMIN FOR WOMEN) TABS Take by mouth.    [provider]  phentermine 37.5 MG capsule Take 1 capsule (37.5 mg total) by mouth every morning. 05/11/13   Marcial Pacas, DO  predniSONE (DELTASONE)  10 MG tablet Take 4 tablets (40 mg total) by mouth daily. 07/16/17   Fredia Sorrow, MD  VITAMIN A OP Apply to eye.    [provider]  vitamin C (ASCORBIC ACID) 500 MG tablet Take 500 mg by mouth daily.    [provider]  VITAMIN D, CHOLECALCIFEROL, PO Take by mouth.    [provider]    Family History Family History  Problem Relation Age of Onset  . Breast cancer Mother   . Hypertension Mother   . Hyperlipidemia Mother   . Colon cancer Father   . Cancer Unknown   . Thyroid disease Sister   . Hypertension Maternal Aunt   . Heart failure Maternal Aunt   . Hypertension Maternal Aunt   . Hypertension Maternal Uncle   . Diabetes Maternal Uncle   . Heart failure Maternal Grandmother   . Cancer  Maternal Grandfather        prostate    Social History Social History  Substance Use Topics  . Smoking status: Never Smoker  . Smokeless tobacco: Never Used  . Alcohol use No     Allergies   Latex; Lisinopril; and Sulfa antibiotics   Review of Systems Review of Systems  Constitutional: Negative for fever.  HENT: Positive for facial swelling. Negative for congestion, sore throat, trouble swallowing and voice change.   Eyes: Negative for redness and itching.  Respiratory: Negative for shortness of breath, wheezing and stridor.   Cardiovascular: Positive for leg swelling. Negative for chest pain.  Gastrointestinal: Negative for abdominal pain, nausea and vomiting.  Genitourinary: Negative for dysuria.  Musculoskeletal: Positive for joint swelling. Negative for back pain.  Skin: Negative for rash.  Neurological: Negative for headaches.  Hematological: Does not bruise/bleed easily.  Psychiatric/Behavioral: Negative for confusion.     Physical Exam Updated Vital Signs BP (!) 151/85 (BP Location: Right Arm)   Pulse 72   Temp 98.2 F (36.8 C) (Oral)   Resp 18   Ht 1.626 m (5\' 4" )   Wt 104.3 kg (230 lb)   SpO2 100%   BMI 39.48 kg/m   Physical Exam  Constitutional: She is oriented to person, place, and time. She appears well-developed and well-nourished. No distress.  HENT:  Head: Atraumatic.  Mouth/Throat: Oropharynx is clear and moist.  Tongue without any swelling. Swelling to the left upper lip.  Eyes: Pupils are equal, round, and reactive to light. Conjunctivae and EOM are normal.  Neck: Normal range of motion. Neck supple.  Cardiovascular: Normal rate and regular rhythm.   Pulmonary/Chest: Effort normal and breath sounds normal. She has no wheezes.  Abdominal: Soft. Bowel sounds are normal.  Musculoskeletal: Normal range of motion. She exhibits edema.  Swelling to the left knee without any erythema slight increased warmth. Swelling to the left leg. Mild pitting  edema. Cap refill normal.  Neurological: She is alert and oriented to person, place, and time. No cranial nerve deficit or sensory deficit. She exhibits normal muscle tone. Coordination normal.  Skin: Skin is warm and dry. No rash noted.  Nursing note and vitals reviewed.    ED Treatments / Results  Labs (all labs ordered are listed, but only abnormal results are displayed) Labs Reviewed - No data to display  EKG  EKG Interpretation None       Radiology No results found.  Procedures Procedures (including critical care time)  Medications Ordered in ED Medications  predniSONE (DELTASONE) tablet 60 mg (60 mg Oral Given 07/16/17 0810)  famotidine (  PEPCID) tablet 20 mg (20 mg Oral Given 07/16/17 0810)     Initial Impression / Assessment and Plan / ED Course  I have reviewed the triage vital signs and the nursing notes.  Pertinent labs & imaging results that were available during my care of the patient were reviewed by me and considered in my medical decision making (see chart for details).    Symptoms most likely consistent with angioedema from the lisinopril. Patient will hold the lisinopril and contact her doctor in [place] for recommendation on other blood pressure medicine. Labs reviewed from the visit on October 6. Patient with mild hypokalemia potassium 3.4. Patient informed that does not require potassium supplement for that. Patient will be treated as if it could be an allergic reaction but most likely angioedema. Patient observed here for a period of time without any worsening symptoms. Patient took Benadryl at home patient got Pepcid and prednisone here. Will be continued on Benadryl for the next 24 hours, Pepcid for the next 7 days, and prednisone for the next 5 days.  Patients of left knee swelling probably consistent with arthritis. However cannot rule out a connective tissue disorder will require additional follow-up. Patient given wellness clinic referral in the  meantime until she can find a primary care doctor locally.  The leg swelling on the previous visit was evaluated Doppler studies which were negative for DVT as well as x-rays.   Final Clinical Impressions(s) / ED Diagnoses   Final diagnoses:  Angioedema, initial encounter    New Prescriptions New Prescriptions   FAMOTIDINE (PEPCID) 20 MG TABLET    Take 1 tablet (20 mg total) by mouth 2 (two) times daily.   PREDNISONE (DELTASONE) 10 MG TABLET    Take 4 tablets (40 mg total) by mouth daily.     Fredia Sorrow, MD 07/16/17 [phone]

## 2017-07-16 NOTE — ED Triage Notes (Signed)
Woke up this am with swelling to left side of upper lip, denies SOB. Took bendaryl 1 tab PTA

## 2017-07-16 NOTE — Discharge Instructions (Signed)
Would recommend taking Benadryl for the next 24 hours. Take prednisone for 5 days. Take the Pepcid for 7 days. Contact your doctor in [place] for adjustment in your blood pressure medicine. Probably are going to have to stop the lisinopril. Would hold on taking it until you discuss it with your doctor in [place]. Referral given to wellness clinic which may help you find a primary care doctor in the meantime. Return for any new or worse symptoms to include tongue swelling trouble breathing.

## 2017-11-12 ENCOUNTER — Emergency Department (HOSPITAL_BASED_OUTPATIENT_CLINIC_OR_DEPARTMENT_OTHER)
Admission: EM | Admit: 2017-11-12 | Discharge: 2017-11-12 | Disposition: A | Discharge status: Home / Self Care | Payer: Self-pay | Attending: Emergency Medicine | Admitting: Emergency Medicine

## 2017-11-12 ENCOUNTER — Encounter (HOSPITAL_BASED_OUTPATIENT_CLINIC_OR_DEPARTMENT_OTHER): Payer: Self-pay

## 2017-11-12 ENCOUNTER — Emergency Department (HOSPITAL_BASED_OUTPATIENT_CLINIC_OR_DEPARTMENT_OTHER): Payer: Self-pay

## 2017-11-12 ENCOUNTER — Other Ambulatory Visit: Payer: Self-pay

## 2017-11-12 DIAGNOSIS — R69 Illness, unspecified: Secondary | ICD-10-CM

## 2017-11-12 DIAGNOSIS — J069 Acute upper respiratory infection, unspecified: Secondary | ICD-10-CM | POA: Insufficient documentation

## 2017-11-12 DIAGNOSIS — J111 Influenza due to unidentified influenza virus with other respiratory manifestations: Secondary | ICD-10-CM

## 2017-11-12 DIAGNOSIS — B9789 Other viral agents as the cause of diseases classified elsewhere: Secondary | ICD-10-CM | POA: Insufficient documentation

## 2017-11-12 DIAGNOSIS — N3001 Acute cystitis with hematuria: Secondary | ICD-10-CM | POA: Insufficient documentation

## 2017-11-12 DIAGNOSIS — Z79899 Other long term (current) drug therapy: Secondary | ICD-10-CM | POA: Insufficient documentation

## 2017-11-12 DIAGNOSIS — I10 Essential (primary) hypertension: Secondary | ICD-10-CM | POA: Insufficient documentation

## 2017-11-12 LAB — URINALYSIS, MICROSCOPIC (REFLEX)

## 2017-11-12 LAB — URINALYSIS, ROUTINE W REFLEX MICROSCOPIC
Bilirubin Urine: NEGATIVE
Glucose, UA: NEGATIVE mg/dL
Ketones, ur: NEGATIVE mg/dL
Leukocytes, UA: NEGATIVE
Nitrite: POSITIVE — AB
Protein, ur: NEGATIVE mg/dL
Specific Gravity, Urine: >1.03 — ABNORMAL HIGH (ref 1.005–1.030)
pH: 6 (ref 5.0–8.0)

## 2017-11-12 LAB — RAPID STREP SCREEN (MED CTR MEBANE ONLY): Streptococcus, Group A Screen (Direct): NEGATIVE

## 2017-11-12 LAB — PREGNANCY, URINE: PREG TEST UR: NEGATIVE

## 2017-11-12 MED ORDER — OSELTAMIVIR PHOSPHATE 75 MG PO CAPS: 75.0000 mg | ORAL_CAPSULE | Freq: Two times a day (BID) | ORAL | 0 refills | Status: DC

## 2017-11-12 MED ORDER — CEPHALEXIN 500 MG PO CAPS: 500.0000 mg | ORAL_CAPSULE | Freq: Two times a day (BID) | ORAL | 0 refills | Status: DC

## 2017-11-12 NOTE — Discharge Instructions (Addendum)
Your exam and history today fit with influenza-like illness.  We will treat you as if you have flu even though your flu test is not back.  Please take the antibiotics to treat the urinary tract infection we also discovered.  Please use your home nausea medicine and stay hydrated.  Please follow-up with your primary care physician in the next several days.  If any symptoms change or worsen, please return to the nearest ED.

## 2017-11-12 NOTE — ED Notes (Signed)
Per rad tech-pt advised her she may be pregnant-pt was shielded-Upreg added to labs

## 2017-11-12 NOTE — ED Notes (Signed)
Patient took Motrin 400 mg at 1 pm.  Generalized body aches, joint pains and sore throat verbalized.

## 2017-11-12 NOTE — ED Provider Notes (Signed)
[facility] EMERGENCY DEPARTMENT Provider Note   CSN: [id number] Arrival date & time: 11/12/17  1326     History   Chief Complaint Chief Complaint  Patient presents with  . Cough    HPI Destiny Henderson is a 57 y.o. female.  The history is provided by the patient and medical records. No language interpreter was used.  URI   This is a new problem. The current episode started more than 2 days ago. The problem has not changed since onset.The maximum temperature recorded prior to her arrival was 101 to 101.9 F. The fever has been present for less than 1 day. Associated symptoms include nausea, dysuria, congestion, rhinorrhea, sneezing, sore throat and cough. Pertinent negatives include no chest pain, no abdominal pain, no diarrhea, no vomiting, no headaches, no joint pain, no joint swelling, no neck pain, no rash and no wheezing. She has tried nothing for the symptoms. The treatment provided no relief.  Dysuria   This is a new problem. The current episode started more than 2 days ago. The problem occurs every urination. The problem has not changed since onset.The quality of the pain is described as burning. The pain is mild. Associated symptoms include chills, sweats and nausea. Pertinent negatives include no vomiting, no discharge, no frequency, no hematuria, no hesitancy, no urgency and no flank pain. She has tried nothing for the symptoms.    Past Medical History:  Diagnosis Date  . Anemia   . Benign tumor of bladder   . DVT (deep venous thrombosis) ([place])   . Essential hypertension, benign 03/03/2013  . Fibroma of heart   . Obesity 03/03/2013    Patient Active Problem List   Diagnosis Date Noted  . Menorrhagia 05/20/2013  . Essential hypertension, benign 03/03/2013  . Obesity 03/03/2013    Past Surgical History:  Procedure Laterality Date  . BACK SURGERY    . bladder tack    . DILATION AND CURETTAGE OF UTERUS     after SAB  . HEART TUMOR EXCISION      OB  History    Gravida Para Term Preterm AB Living   4 3 3   1 3    SAB TAB Ectopic Multiple Live Births   1               Home Medications    Prior to Admission medications   Medication Sig Start Date End Date Taking? Authorizing Provider  b complex vitamins capsule Take 1 capsule by mouth daily.    [provider]  Coenzyme Q10 (CO Q 10 PO) Take by mouth.    [provider]  ECHINACEA PO Take by mouth.    [provider]  fish oil-omega-3 fatty acids 1000 MG capsule Take 2 g by mouth daily.    [provider]  FLAXSEED, LINSEED, PO Take by mouth.    [provider]  furosemide (LASIX) 40 MG tablet Take 1 tablet (40 mg total) by mouth daily. 06/09/17   Forde Dandy, MD  losartan (COZAAR) 25 MG tablet Take 25 mg by mouth daily.    [provider]  Melatonin 5 MG CAPS Take by mouth.    [provider]  Multiple Vitamins-Minerals (MEGA MULTIVITAMIN FOR WOMEN) TABS Take by mouth.    [provider]  VITAMIN A OP Apply to eye.    [provider]  vitamin C (ASCORBIC ACID) 500 MG tablet Take 500 mg by mouth daily.    [provider]  VITAMIN D, CHOLECALCIFEROL, PO Take by mouth.    [provider]    Family History Family History  Problem Relation Age of Onset  . Breast cancer Mother   . Hypertension Mother   . Hyperlipidemia Mother   . Colon cancer Father   . Cancer Unknown   . Thyroid disease Sister   . Hypertension Maternal Aunt   . Heart failure Maternal Aunt   . Hypertension Maternal Aunt   . Hypertension Maternal Uncle   . Diabetes Maternal Uncle   . Heart failure Maternal Grandmother   . Cancer Maternal Grandfather        prostate    Social History Social History   Tobacco Use  . Smoking status: Never Smoker  . Smokeless tobacco: Never Used  Substance Use Topics  . Alcohol use: No  . Drug use: No     Allergies   Latex; Lisinopril; and Sulfa antibiotics   Review  of Systems Review of Systems  Constitutional: Positive for chills, fatigue and fever. Negative for diaphoresis.  HENT: Positive for congestion, rhinorrhea, sneezing and sore throat. Negative for facial swelling, nosebleeds, trouble swallowing and voice change.   Eyes: Negative for redness and visual disturbance.  Respiratory: Positive for cough. Negative for chest tightness, shortness of breath and wheezing.   Cardiovascular: Negative for chest pain, palpitations and leg swelling.  Gastrointestinal: Positive for nausea. Negative for abdominal distention, abdominal pain, constipation, diarrhea and vomiting.  Genitourinary: Positive for dysuria. Negative for difficulty urinating, flank pain, frequency, hematuria, hesitancy, urgency, vaginal bleeding, vaginal discharge and vaginal pain.  Musculoskeletal: Negative for back pain, joint pain, neck pain and neck stiffness.  Skin: Negative for rash.  Neurological: Negative for weakness, light-headedness, numbness and headaches.  Psychiatric/Behavioral: Negative for agitation and confusion.  All other systems reviewed and are negative.    Physical Exam Updated Vital Signs BP (!) 146/85 (BP Location: Left Arm)   Pulse 68   Temp 99.9 F (37.7 C) (Oral)   Resp 20   Ht 5\' 4"  (1.626 m)   Wt 103 kg (227 lb)   LMP 09/04/2017   SpO2 98%   BMI 38.96 kg/m   Physical Exam  Constitutional: She is oriented to person, place, and time. She appears well-developed and well-nourished. No distress.  HENT:  Head: Normocephalic and atraumatic.  Nose: Rhinorrhea present.  Mouth/Throat: Uvula is midline. No trismus in the jaw. No dental abscesses, uvula swelling or lacerations. Posterior oropharyngeal erythema present. No oropharyngeal exudate, posterior oropharyngeal edema or tonsillar abscesses.  Eyes: Conjunctivae and EOM are normal. Pupils are equal, round, and reactive to light.  Neck: Normal range of motion.  No stridor.  No erythema in posterior  oropharynx. Midline.  Cardiovascular: Normal rate and intact distal pulses.  No murmur heard. Pulmonary/Chest: Effort normal and breath sounds normal. No respiratory distress. She has no wheezes. She has no rales. She exhibits no tenderness.  Abdominal: Soft. Bowel sounds are normal. She exhibits no distension. There is no tenderness.  Musculoskeletal: She exhibits no edema or tenderness.  Neurological: She is alert and oriented to person, place, and time. No cranial nerve deficit or sensory deficit. She exhibits normal muscle tone.  Skin: Capillary refill takes less than 2 seconds. No rash noted. She is not diaphoretic. No erythema.  Psychiatric: She has a normal mood and affect.  Nursing note and vitals reviewed.    ED Treatments / Results  Labs (all labs ordered are listed, but only abnormal results are  displayed) Labs Reviewed  URINALYSIS, ROUTINE W REFLEX MICROSCOPIC - Abnormal; Notable for the following components:      Result Value   Specific Gravity, Urine >1.030 (*)    Hgb urine dipstick MODERATE (*)    Nitrite POSITIVE (*)    All other components within normal limits  URINALYSIS, MICROSCOPIC (REFLEX) - Abnormal; Notable for the following components:   Bacteria, UA MANY (*)    Squamous Epithelial / LPF 0-5 (*)    All other components within normal limits  RAPID STREP SCREEN (NOT AT [facility])  CULTURE, GROUP A STREP [facility])  PREGNANCY, URINE  INFLUENZA PANEL BY PCR (TYPE A & B)    EKG  EKG Interpretation None       Radiology Dg Chest 2 View  Result Date: 11/12/2017 CLINICAL DATA:  Productive cough, fever. EXAM: CHEST  2 VIEW COMPARISON:  Radiographs of July 13, 2017. FINDINGS: The heart size and mediastinal contours are within normal limits. Both lungs are clear. No pneumothorax or pleural effusion is noted. The visualized skeletal structures are unremarkable. IMPRESSION: No active cardiopulmonary disease. Electronically Signed   By: Marijo Conception, M.D.   On:  11/12/2017 14:18    Procedures Procedures (including critical care time)  Medications Ordered in ED Medications - No data to display   Initial Impression / Assessment and Plan / ED Course  I have reviewed the triage vital signs and the nursing notes.  Pertinent labs & imaging results that were available during my care of the patient were reviewed by me and considered in my medical decision making (see chart for details).     Destiny Henderson is a 57 y.o. female with a past medical history significant for hypertension, prior DVT, and anemia who presents with fevers, chills, congestion, cough, sore throat, myalgias, fatigue, cough, and dysuria.  Patient reports that at her work, patient has had numerous sick contacts.  She reports that over the last 3 days she has had development of her symptoms including a productive cough with yellow sputum.  She reports having fevers up to 101 the last 2 days that responded to Motrin.  She reports she developed sore throat and has had decreased oral intake from this.  She is still tolerating her secretions.  She reports nausea but no vomiting.  She denies any headaches, vision changes or neurologic complaints.  She reports dysuria and darkened urine.  Patient does have history of UTIs.  She denies flank pain or abdominal pain.  She reports diffuse myalgias.  She denies any chest pain or any neck pain or neck stiffness.  She denies any other complaints on arrival.  Findings patient has rhinorrhea and congestion.  Patient had no stridor on neck auscultation.  No evidence of PTA or RPA.  Patient had some erythema in her posterior oropharynx.  Uvula midline.  Patient's lungs were clear with no wheezing or coarse breath sounds.  Chest and abdomen were nontender.  Patient had no edema seen and no rashes appreciated.  No focal neurologic deficit seen.  Based on patient's constellation of symptoms I am concerned about flulike illness, pneumonia, urinary tract infection  or other viral infection.    Urinalysis was collected and shows nitrites and bacteria concerning for UTI.  Patient will be given antibiotics.  Patient's chest x-ray shows no evidence of pneumonia however I still feel she likely has flu.  Flu test will be sent however due to the location of evaluation, the result will not be obtained promptly.  Patient will be empirically treated with Tamiflu.  Strep test was negative, do not feel patient has strep throat.  Suspect the viral infection causing symptoms.    Patient reports having nausea medicine at home and feels that over-the-counter Motrin should help with her discomfort.  Patient was understand the plan of care as well as follow-up instructions.  Patient had no other questions or concerns and was discharged in good condition with prescription for Tamiflu and antibiotics for UTI.    Final Clinical Impressions(s) / ED Diagnoses   Final diagnoses:  Viral URI with cough  Influenza-like illness  Acute cystitis with hematuria    ED Discharge Orders        Ordered    oseltamivir (TAMIFLU) 75 MG capsule  Every 12 hours     11/12/17 1757    cephALEXin (KEFLEX) 500 MG capsule  2 times daily     11/12/17 1757      Clinical Impression: 1. Viral URI with cough   2. Influenza-like illness   3. Acute cystitis with hematuria     Disposition: Discharge  Condition: Good  I have discussed the results, Dx and Tx plan with the pt(& family if present). He/she/they expressed understanding and agree(s) with the plan. Discharge instructions discussed at great length. Strict return precautions discussed and pt &/or family have verbalized understanding of the instructions. No further questions at time of discharge.    New Prescriptions   CEPHALEXIN (KEFLEX) 500 MG CAPSULE    Take 1 capsule (500 mg total) by mouth 2 (two) times daily for 7 days.   OSELTAMIVIR (TAMIFLU) 75 MG CAPSULE    Take 1 capsule (75 mg total) by mouth every 12 (twelve) hours.     Follow Up: [place] [id number] [phone] Schedule an appointment as soon as possible for a visit    [place] [address] [id number] [facility] [place] [phone]       Selmer Adduci, Gwenyth Allegra, MD 11/13/17 [phone]

## 2017-11-12 NOTE — ED Notes (Signed)
Pt requested strep screen and UA

## 2017-11-12 NOTE — ED Triage Notes (Addendum)
C/o flu like sx with prod cough, sore throat, dark urine x 3 days-NAD-steady gait

## 2017-11-13 ENCOUNTER — Telehealth (HOSPITAL_BASED_OUTPATIENT_CLINIC_OR_DEPARTMENT_OTHER): Payer: Self-pay | Admitting: Emergency Medicine

## 2017-11-13 LAB — INFLUENZA PANEL BY PCR (TYPE A & B)
INFLBPCR: NEGATIVE
Influenza A By PCR: NEGATIVE

## 2017-11-15 LAB — CULTURE, GROUP A STREP (THRC)

## 2017-11-18 ENCOUNTER — Other Ambulatory Visit: Payer: Self-pay

## 2017-11-18 ENCOUNTER — Encounter (HOSPITAL_BASED_OUTPATIENT_CLINIC_OR_DEPARTMENT_OTHER): Payer: Self-pay | Admitting: *Deleted

## 2017-11-18 ENCOUNTER — Emergency Department (HOSPITAL_BASED_OUTPATIENT_CLINIC_OR_DEPARTMENT_OTHER)
Admission: EM | Admit: 2017-11-18 | Discharge: 2017-11-18 | Disposition: A | Discharge status: Home / Self Care | Payer: PRIVATE HEALTH INSURANCE | Attending: Emergency Medicine | Admitting: Emergency Medicine

## 2017-11-18 DIAGNOSIS — Z91040 Latex allergy status: Secondary | ICD-10-CM | POA: Insufficient documentation

## 2017-11-18 DIAGNOSIS — I10 Essential (primary) hypertension: Secondary | ICD-10-CM | POA: Insufficient documentation

## 2017-11-18 DIAGNOSIS — R059 Cough, unspecified: Secondary | ICD-10-CM

## 2017-11-18 DIAGNOSIS — Z79899 Other long term (current) drug therapy: Secondary | ICD-10-CM | POA: Diagnosis not present

## 2017-11-18 DIAGNOSIS — R05 Cough: Secondary | ICD-10-CM | POA: Diagnosis present

## 2017-11-18 MED ORDER — BENZONATATE 100 MG PO CAPS: 100.0000 mg | ORAL_CAPSULE | Freq: Three times a day (TID) | ORAL | 0 refills | Status: DC

## 2017-11-18 MED FILL — BENZONATATE 100 MG CAPSULE: 100 | 7 days supply | Qty: 21 | Fill #0

## 2017-11-18 NOTE — ED Triage Notes (Signed)
Pt states she is here for a recheck, dx with flu a week ago, cont with cough but otherwise feels better. No fevers, states she needs a note to return to work.

## 2017-11-18 NOTE — ED Provider Notes (Signed)
[facility] EMERGENCY DEPARTMENT Provider Note   CSN: [id number] Arrival date & time: 11/18/17  0840     History   Chief Complaint Chief Complaint  Patient presents with  . Cough    HPI Destiny Henderson is a 57 y.o. female.  Chief complaint is cough, needs note to return to work  HPI 57 year old female.  Seen and evaluated last week with a flulike illness.  Treated with Tamiflu empirically.  Flu has subsequently returned negative.  She has improved although has persistent cough.  No fever.  No additional symptoms.  States she cannot return to work without a copy of her negative flu and a physician note.  Past Medical History:  Diagnosis Date  . Anemia   . Benign tumor of bladder   . DVT (deep venous thrombosis) ([place])   . Essential hypertension, benign 03/03/2013  . Fibroma of heart   . Obesity 03/03/2013    Patient Active Problem List   Diagnosis Date Noted  . Menorrhagia 05/20/2013  . Essential hypertension, benign 03/03/2013  . Obesity 03/03/2013    Past Surgical History:  Procedure Laterality Date  . BACK SURGERY    . bladder tack    . DILATION AND CURETTAGE OF UTERUS     after SAB  . HEART TUMOR EXCISION      OB History    Gravida Para Term Preterm AB Living   4 3 3   1 3    SAB TAB Ectopic Multiple Live Births   1               Home Medications    Prior to Admission medications   Medication Sig Start Date End Date Taking? Authorizing Provider  b complex vitamins capsule Take 1 capsule by mouth daily.    [provider]  benzonatate (TESSALON) 100 MG capsule Take 1 capsule (100 mg total) by mouth every 8 (eight) hours. 11/18/17   Tanna Furry, MD  cephALEXin (KEFLEX) 500 MG capsule Take 1 capsule (500 mg total) by mouth 2 (two) times daily for 7 days. 11/12/17 11/19/17  Tegeler, Gwenyth Allegra, MD  Coenzyme Q10 (CO Q 10 PO) Take by mouth.    [provider]  ECHINACEA PO Take by mouth.    [provider]  fish oil-omega-3  fatty acids 1000 MG capsule Take 2 g by mouth daily.    [provider]  FLAXSEED, LINSEED, PO Take by mouth.    [provider]  furosemide (LASIX) 40 MG tablet Take 1 tablet (40 mg total) by mouth daily. 06/09/17   Forde Dandy, MD  losartan (COZAAR) 25 MG tablet Take 25 mg by mouth daily.    [provider]  Melatonin 5 MG CAPS Take by mouth.    [provider]  Multiple Vitamins-Minerals (MEGA MULTIVITAMIN FOR WOMEN) TABS Take by mouth.    [provider]  oseltamivir (TAMIFLU) 75 MG capsule Take 1 capsule (75 mg total) by mouth every 12 (twelve) hours. 11/12/17   Tegeler, Gwenyth Allegra, MD  VITAMIN A OP Apply to eye.    [provider]  vitamin C (ASCORBIC ACID) 500 MG tablet Take 500 mg by mouth daily.    [provider]  VITAMIN D, CHOLECALCIFEROL, PO Take by mouth.    [provider]    Family History Family History  Problem Relation Age of Onset  . Breast cancer Mother   . Hypertension Mother   . Hyperlipidemia Mother   .  Colon cancer Father   . Cancer Unknown   . Thyroid disease Sister   . Hypertension Maternal Aunt   . Heart failure Maternal Aunt   . Hypertension Maternal Aunt   . Hypertension Maternal Uncle   . Diabetes Maternal Uncle   . Heart failure Maternal Grandmother   . Cancer Maternal Grandfather        prostate    Social History Social History   Tobacco Use  . Smoking status: Never Smoker  . Smokeless tobacco: Never Used  Substance Use Topics  . Alcohol use: No  . Drug use: No     Allergies   Latex; Lisinopril; and Sulfa antibiotics   Review of Systems Review of Systems  Constitutional: Negative for appetite change, chills, diaphoresis, fatigue and fever.  HENT: Negative for mouth sores, sore throat and trouble swallowing.   Eyes: Negative for visual disturbance.  Respiratory: Positive for cough. Negative for chest tightness, shortness of breath and wheezing.     Cardiovascular: Negative for chest pain.  Gastrointestinal: Negative for abdominal distention, abdominal pain, diarrhea, nausea and vomiting.  Endocrine: Negative for polydipsia, polyphagia and polyuria.  Genitourinary: Negative for dysuria, frequency and hematuria.  Musculoskeletal: Negative for gait problem.  Skin: Negative for color change, pallor and rash.  Neurological: Negative for dizziness, syncope, light-headedness and headaches.  Hematological: Does not bruise/bleed easily.  Psychiatric/Behavioral: Negative for behavioral problems and confusion.     Physical Exam Updated Vital Signs BP (!) 167/101 (BP Location: Right Arm)   Pulse 72   Temp 98.3 F (36.8 C) (Oral)   Resp 18   SpO2 100%   Physical Exam  Constitutional: She is oriented to person, place, and time. She appears well-developed and well-nourished. No distress.  HENT:  Head: Normocephalic.  Eyes: Conjunctivae are normal. Pupils are equal, round, and reactive to light. No scleral icterus.  Neck: Normal range of motion. Neck supple. No thyromegaly present.  Cardiovascular: Normal rate and regular rhythm. Exam reveals no gallop and no friction rub.  No murmur heard. Pulmonary/Chest: Effort normal and breath sounds normal. No respiratory distress. She has no wheezes. She has no rales.  Abdominal: Soft. Bowel sounds are normal. She exhibits no distension. There is no tenderness. There is no rebound.  Musculoskeletal: Normal range of motion.  Neurological: She is alert and oriented to person, place, and time.  Skin: Skin is warm and dry. No rash noted.  Psychiatric: She has a normal mood and affect. Her behavior is normal.     ED Treatments / Results  Labs (all labs ordered are listed, but only abnormal results are displayed) Labs Reviewed - No data to display  EKG  EKG Interpretation None       Radiology No results found.  Procedures Procedures (including critical care time)  Medications Ordered  in ED Medications - No data to display   Initial Impression / Assessment and Plan / ED Course  I have reviewed the triage vital signs and the nursing notes.  Pertinent labs & imaging results that were available during my care of the patient were reviewed by me and considered in my medical decision making (see chart for details).     Normal exam.  Afebrile.  Well oxygenated.  Normal lungs.  Appropriate to return to work without restriction.  Final Clinical Impressions(s) / ED Diagnoses   Final diagnoses:  Cough    ED Discharge Orders        Ordered    benzonatate (TESSALON) 100 MG capsule  Every 8 hours     11/18/17 0856       Tanna Furry, MD 11/18/17 0900

## 2017-11-18 NOTE — ED Notes (Signed)
ED Provider at bedside. 

## 2017-11-18 NOTE — Discharge Instructions (Signed)
Influenza test was negative last week. Tessalon for cough. No work restrictions.

## 2018-01-21 ENCOUNTER — Emergency Department (HOSPITAL_BASED_OUTPATIENT_CLINIC_OR_DEPARTMENT_OTHER): Payer: PRIVATE HEALTH INSURANCE

## 2018-01-21 ENCOUNTER — Encounter (HOSPITAL_BASED_OUTPATIENT_CLINIC_OR_DEPARTMENT_OTHER): Payer: Self-pay

## 2018-01-21 ENCOUNTER — Ambulatory Visit: Payer: Self-pay | Admitting: Obstetrics & Gynecology

## 2018-01-21 ENCOUNTER — Emergency Department (HOSPITAL_BASED_OUTPATIENT_CLINIC_OR_DEPARTMENT_OTHER)
Admission: EM | Admit: 2018-01-21 | Discharge: 2018-01-21 | Disposition: A | Discharge status: Home / Self Care | Payer: PRIVATE HEALTH INSURANCE | Attending: Emergency Medicine | Admitting: Emergency Medicine

## 2018-01-21 ENCOUNTER — Other Ambulatory Visit: Payer: Self-pay

## 2018-01-21 DIAGNOSIS — Z79899 Other long term (current) drug therapy: Secondary | ICD-10-CM | POA: Insufficient documentation

## 2018-01-21 DIAGNOSIS — M549 Dorsalgia, unspecified: Secondary | ICD-10-CM | POA: Diagnosis not present

## 2018-01-21 DIAGNOSIS — R079 Chest pain, unspecified: Secondary | ICD-10-CM | POA: Diagnosis not present

## 2018-01-21 DIAGNOSIS — M542 Cervicalgia: Secondary | ICD-10-CM | POA: Insufficient documentation

## 2018-01-21 DIAGNOSIS — R1030 Lower abdominal pain, unspecified: Secondary | ICD-10-CM | POA: Insufficient documentation

## 2018-01-21 DIAGNOSIS — M2550 Pain in unspecified joint: Secondary | ICD-10-CM | POA: Diagnosis not present

## 2018-01-21 DIAGNOSIS — Z91040 Latex allergy status: Secondary | ICD-10-CM | POA: Insufficient documentation

## 2018-01-21 DIAGNOSIS — R51 Headache: Secondary | ICD-10-CM | POA: Diagnosis not present

## 2018-01-21 DIAGNOSIS — I10 Essential (primary) hypertension: Secondary | ICD-10-CM | POA: Diagnosis not present

## 2018-01-21 DIAGNOSIS — Z01419 Encounter for gynecological examination (general) (routine) without abnormal findings: Secondary | ICD-10-CM

## 2018-01-21 LAB — BASIC METABOLIC PANEL
Anion gap: 8 (ref 5–15)
BUN: 13 mg/dL (ref 6–20)
CO2: 25 mmol/L (ref 22–32)
Calcium: 8.7 mg/dL — ABNORMAL LOW (ref 8.9–10.3)
Chloride: 107 mmol/L (ref 101–111)
Creatinine, Ser: 1.14 mg/dL — ABNORMAL HIGH (ref 0.44–1.00)
GFR calc Af Amer: >60 mL/min (ref >60)
GFR calc non Af Amer: 53 mL/min — ABNORMAL LOW (ref >60)
Glucose, Bld: 93 mg/dL (ref 65–99)
Potassium: 3.6 mmol/L (ref 3.5–5.1)
Sodium: 140 mmol/L (ref 135–145)

## 2018-01-21 MED ORDER — AMLODIPINE BESYLATE 5 MG PO TABS: 5.0000 mg | ORAL_TABLET | Freq: Every day | ORAL | 0 refills | Status: DC

## 2018-01-21 MED ORDER — IOPAMIDOL (ISOVUE-300) INJECTION 61%
100.0000 mL | Freq: Once | INTRAVENOUS | Status: AC | PRN
  Administered 2018-01-21: 100 mL via INTRAVENOUS

## 2018-01-21 MED ORDER — METHOCARBAMOL 500 MG PO TABS: 500.0000 mg | ORAL_TABLET | Freq: Two times a day (BID) | ORAL | 0 refills | Status: DC

## 2018-01-21 MED ORDER — ACETAMINOPHEN 500 MG PO TABS: 500.0000 mg | ORAL_TABLET | Freq: Four times a day (QID) | ORAL | 0 refills | Status: DC | PRN

## 2018-01-21 MED ORDER — ONDANSETRON HCL 4 MG/2ML IJ SOLN
4.0000 mg | Freq: Once | INTRAMUSCULAR | Status: AC
  Administered 2018-01-21: 4 mg via INTRAVENOUS
  Filled 2018-01-21: qty 2

## 2018-01-21 MED ORDER — KETOROLAC TROMETHAMINE 30 MG/ML IJ SOLN
30.0000 mg | Freq: Once | INTRAMUSCULAR | Status: AC
  Administered 2018-01-21: 30 mg via INTRAVENOUS
  Filled 2018-01-21: qty 1

## 2018-01-21 MED ORDER — NAPROXEN 375 MG PO TABS: 375.0000 mg | ORAL_TABLET | Freq: Two times a day (BID) | ORAL | 0 refills | Status: DC

## 2018-01-21 MED FILL — AMLODIPINE BESYLATE 5 MG TA: 5 | 30 days supply | Qty: 30 | Fill #0

## 2018-01-21 MED FILL — METHOCARBAMOL 500 MG TABLET: 500 | 10 days supply | Qty: 20 | Fill #0

## 2018-01-21 MED FILL — ACETAMINOPHEN 500 MG TABS: 500 | 25 days supply | Qty: 100 | Fill #0

## 2018-01-21 MED FILL — NAPROXEN 375 MG TABLET: 375 | 10 days supply | Qty: 20 | Fill #0
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
## 2018-01-22 ENCOUNTER — Encounter: Payer: Self-pay | Admitting: Family Medicine

## 2018-01-22 ENCOUNTER — Ambulatory Visit (INDEPENDENT_AMBULATORY_CARE_PROVIDER_SITE_OTHER): Payer: PRIVATE HEALTH INSURANCE | Admitting: Family Medicine

## 2018-01-22 DIAGNOSIS — S6991XA Unspecified injury of right wrist, hand and finger(s), initial encounter: Secondary | ICD-10-CM | POA: Diagnosis not present

## 2018-01-22 DIAGNOSIS — S8991XA Unspecified injury of right lower leg, initial encounter: Secondary | ICD-10-CM | POA: Diagnosis not present
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
## 2018-01-24 ENCOUNTER — Ambulatory Visit: Payer: PRIVATE HEALTH INSURANCE | Admitting: Cardiology

## 2018-01-24 ENCOUNTER — Encounter: Payer: Self-pay | Admitting: Family Medicine

## 2018-01-24 DIAGNOSIS — S6991XD Unspecified injury of right wrist, hand and finger(s), subsequent encounter: Secondary | ICD-10-CM | POA: Insufficient documentation

## 2018-01-24 DIAGNOSIS — S8991XD Unspecified injury of right lower leg, subsequent encounter: Secondary | ICD-10-CM

## 2018-01-24 HISTORY — DX: Unspecified injury of right lower leg, subsequent encounter: S89.91XD

## 2018-01-24 HISTORY — DX: Unspecified injury of right wrist, hand and finger(s), subsequent encounter: S69.91XD
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
## 2018-01-24 NOTE — Assessment & Plan Note (Signed)
independently reviewed radiographs and no evidence fracture.  Exam also reassuring.  Consistent with sprain.  Wrist brace, icing with tylenol and/or aleve as needed.  F/u in 2 weeks for reevaluation.

## 2018-02-05 ENCOUNTER — Ambulatory Visit (INDEPENDENT_AMBULATORY_CARE_PROVIDER_SITE_OTHER): Payer: PRIVATE HEALTH INSURANCE | Admitting: Family Medicine

## 2018-02-05 ENCOUNTER — Encounter: Payer: Self-pay | Admitting: Family Medicine

## 2018-02-05 VITALS — BP 132/74 | HR 78 | Ht 64.0 in | Wt 226.0 lb

## 2018-02-05 DIAGNOSIS — Z9189 Other specified personal risk factors, not elsewhere classified: Secondary | ICD-10-CM

## 2018-02-05 DIAGNOSIS — Z124 Encounter for screening for malignant neoplasm of cervix: Secondary | ICD-10-CM | POA: Diagnosis not present

## 2018-02-05 DIAGNOSIS — I10 Essential (primary) hypertension: Secondary | ICD-10-CM

## 2018-02-05 DIAGNOSIS — Z113 Encounter for screening for infections with a predominantly sexual mode of transmission: Secondary | ICD-10-CM

## 2018-02-05 DIAGNOSIS — Z1151 Encounter for screening for human papillomavirus (HPV): Secondary | ICD-10-CM | POA: Diagnosis not present

## 2018-02-05 DIAGNOSIS — Z1239 Encounter for other screening for malignant neoplasm of breast: Secondary | ICD-10-CM

## 2018-02-05 DIAGNOSIS — Z01419 Encounter for gynecological examination (general) (routine) without abnormal findings: Secondary | ICD-10-CM | POA: Diagnosis not present

## 2018-02-05 NOTE — Progress Notes (Signed)
Has liletta IUD.

## 2018-02-05 NOTE — Progress Notes (Signed)
GYNECOLOGY ANNUAL PREVENTATIVE CARE ENCOUNTER NOTE  Subjective:   Destiny Henderson is a 57 y.o. [phone] female here for a routine annual gynecologic exam.  Current complaints: her husband had an affair. She would like STD testing.   Denies abnormal vaginal bleeding, discharge, pelvic pain, problems with intercourse or other gynecologic concerns.    Gynecologic History Patient's last menstrual period was 12/26/2017 (exact date). Patient is sexually active  Contraception: IUD Last Pap: > 3 years ago. Results were: normal Last mammogram: several years ago. Results were: normal  Obstetric History OB History  Gravida Para Term Preterm AB Living  4 3 3   1 3   SAB TAB Ectopic Multiple Live Births  1            # Outcome Date GA Lbr Len/2nd Weight Sex Delivery Anes PTL Lv  4 SAB           3 Term      Vag-Spont     2 Term      Vag-Spont     1 Term      Vag-Spont       Past Medical History:  Diagnosis Date  . Anemia   . Benign tumor of bladder   . DVT (deep venous thrombosis) ([place])   . Essential hypertension, benign 03/03/2013  . Fibroma of heart   . Obesity 03/03/2013    Past Surgical History:  Procedure Laterality Date  . BACK SURGERY    . bladder tack    . DILATION AND CURETTAGE OF UTERUS     after SAB  . HEART TUMOR EXCISION      Current Outpatient Medications on File Prior to Visit  Medication Sig Dispense Refill  . acetaminophen (TYLENOL) 500 MG tablet Take 1 tablet (500 mg total) by mouth every 6 (six) hours as needed. 30 tablet 0  . amLODipine (NORVASC) 5 MG tablet Take 1 tablet (5 mg total) by mouth daily. 30 tablet 0  . b complex vitamins capsule Take 1 capsule by mouth daily.    . benzonatate (TESSALON) 100 MG capsule Take 1 capsule (100 mg total) by mouth every 8 (eight) hours. 21 capsule 0  . Coenzyme Q10 (CO Q 10 PO) Take by mouth.    . ECHINACEA PO Take by mouth.    . fish oil-omega-3 fatty acids 1000 MG capsule Take 2 g by mouth daily.    Marland Kitchen FLAXSEED,  LINSEED, PO Take by mouth.    . Melatonin 5 MG CAPS Take by mouth.    . methocarbamol (ROBAXIN) 500 MG tablet Take 1 tablet (500 mg total) by mouth 2 (two) times daily. 20 tablet 0  . Multiple Vitamins-Minerals (MEGA MULTIVITAMIN FOR WOMEN) TABS Take by mouth.    . naproxen (NAPROSYN) 375 MG tablet Take 1 tablet (375 mg total) by mouth 2 (two) times daily with a meal. 20 tablet 0  . VITAMIN A OP Apply to eye.    . vitamin C (ASCORBIC ACID) 500 MG tablet Take 500 mg by mouth daily.    Marland Kitchen VITAMIN D, CHOLECALCIFEROL, PO Take by mouth.     No current facility-administered medications on file prior to visit.     Allergies  Allergen Reactions  . Keflex [Cephalexin]   . Lasix [Furosemide]   . Latex   . Lisinopril Other (See Comments)    Angioedema   . Sulfa Antibiotics Rash    Social History   Socioeconomic History  . Marital status: Married  Spouse name: Not on file  . Number of children: Not on file  . Years of education: Not on file  . Highest education level: Not on file  Occupational History  . Occupation: Network engineer  Social Needs  . Financial resource strain: Not on file  . Food insecurity:    Worry: Not on file    Inability: Not on file  . Transportation needs:    Medical: Not on file    Non-medical: Not on file  Tobacco Use  . Smoking status: Never Smoker  . Smokeless tobacco: Never Used  Substance and Sexual Activity  . Alcohol use: No  . Drug use: No  . Sexual activity: Not on file  Lifestyle  . Physical activity:    Days per week: Not on file    Minutes per session: Not on file  . Stress: Not on file  Relationships  . Social connections:    Talks on phone: Not on file    Gets together: Not on file    Attends religious service: Not on file    Active member of club or organization: Not on file    Attends meetings of clubs or organizations: Not on file    Relationship status: Not on file  . Intimate partner violence:    Fear of current or ex partner: Not  on file    Emotionally abused: Not on file    Physically abused: Not on file    Forced sexual activity: Not on file  Other Topics Concern  . Not on file  Social History Narrative  . Not on file    Family History  Problem Relation Age of Onset  . Breast cancer Mother   . Hypertension Mother   . Hyperlipidemia Mother   . Colon cancer Father   . Cancer Father   . Thyroid disease Sister   . Cancer Unknown   . Thyroid disease Sister   . Hypertension Maternal Aunt   . Heart failure Maternal Aunt   . Cancer Maternal Aunt   . Hypertension Maternal Aunt   . Hypertension Maternal Uncle   . Diabetes Maternal Uncle   . Heart failure Maternal Grandmother   . Cancer Maternal Grandfather        prostate    The following portions of the patient's history were reviewed and updated as appropriate: allergies, current medications, past family history, past medical history, past social history, past surgical history and problem list.  Review of Systems Pertinent items are noted in HPI.   Objective:  BP 132/74   Pulse 78   Ht 5\' 4"  (1.626 m)   Wt 226 lb (102.5 kg)   LMP 12/26/2017 (Exact Date)   BMI 38.79 kg/m  CONSTITUTIONAL: Well-developed, well-nourished female in no acute distress.  HENT:  Normocephalic, atraumatic, External right and left ear normal. Oropharynx is clear and moist EYES: Conjunctivae and EOM are normal. Pupils are equal, round, and reactive to light. No scleral icterus.  NECK: Normal range of motion, supple, no masses.  Normal thyroid.   CARDIOVASCULAR: Normal heart rate noted, regular rhythm RESPIRATORY: Clear to auscultation bilaterally. Effort and breath sounds normal, no problems with respiration noted. BREASTS: Symmetric in size. No masses, skin changes, nipple drainage, or lymphadenopathy. ABDOMEN: Soft, normal bowel sounds, no distention noted.  No tenderness, rebound or guarding.  PELVIC: Normal appearing external genitalia; normal appearing vaginal mucosa  and cervix.  No abnormal discharge noted.  Pap smear obtained.  Normal uterine size, no other palpable masses,  no uterine or adnexal tenderness. MUSCULOSKELETAL: Normal range of motion. No tenderness.  No cyanosis, clubbing, or edema.  2+ distal pulses. SKIN: Skin is warm and dry. No rash noted. Not diaphoretic. No erythema. No pallor. NEUROLOGIC: Alert and oriented to person, place, and time. Normal reflexes, muscle tone coordination. No cranial nerve deficit noted. PSYCHIATRIC: Normal mood and affect. Normal behavior. Normal judgment and thought content.  Assessment:  Annual gynecologic examination with pap smear   Plan:  1. Well Woman Exam Will follow up results of pap smear and manage accordingly. Mammogram scheduled STD testing discussed. Patient requested testing - serum and vaginal Discussed exercise and diet  - Cytology - PAP - HIV antibody (with reflex) - RPR - Hepatitis C Antibody - Hepatitis B Surface AntiBODY - MM 3D SCREEN BREAST BILATERAL; Future  2. Breast cancer screening, high risk patient  - MM 3D SCREEN BREAST BILATERAL; Future  3. Essential hypertension, benign Continue antihypertensive   Routine preventative health maintenance measures emphasized. Please refer to After Visit Summary for other counseling recommendations.    Loma Boston, [place] for [employer]

## 2018-02-06 ENCOUNTER — Ambulatory Visit (INDEPENDENT_AMBULATORY_CARE_PROVIDER_SITE_OTHER): Payer: PRIVATE HEALTH INSURANCE | Admitting: Cardiology

## 2018-02-06 ENCOUNTER — Encounter: Payer: Self-pay | Admitting: Cardiology

## 2018-02-06 ENCOUNTER — Other Ambulatory Visit: Payer: Self-pay | Admitting: Cardiology

## 2018-02-06 VITALS — BP 132/80 | HR 69 | Ht 64.0 in | Wt 228.4 lb

## 2018-02-06 DIAGNOSIS — R079 Chest pain, unspecified: Secondary | ICD-10-CM

## 2018-02-06 DIAGNOSIS — Z86718 Personal history of other venous thrombosis and embolism: Secondary | ICD-10-CM | POA: Diagnosis not present

## 2018-02-06 DIAGNOSIS — I10 Essential (primary) hypertension: Secondary | ICD-10-CM

## 2018-02-06 DIAGNOSIS — R600 Localized edema: Secondary | ICD-10-CM

## 2018-02-06 DIAGNOSIS — E663 Overweight: Secondary | ICD-10-CM

## 2018-02-06 HISTORY — DX: Localized edema: R60.0

## 2018-02-06 HISTORY — DX: Personal history of other venous thrombosis and embolism: Z86.718

## 2018-02-06 LAB — RPR: RPR: NONREACTIVE

## 2018-02-06 LAB — HEPATITIS B SURFACE ANTIBODY,QUALITATIVE: Hep B Surface Ab, Qual: REACTIVE

## 2018-02-06 LAB — HEPATITIS C ANTIBODY: Hep C Virus Ab: <0.1 s/co ratio (ref 0.0–0.9)

## 2018-02-06 LAB — HIV ANTIBODY (ROUTINE TESTING W REFLEX): HIV SCREEN 4TH GENERATION: NONREACTIVE

## 2018-02-06 MED ORDER — SPIRONOLACTONE-HCTZ 25-25 MG PO TABS: 0.5000 | ORAL_TABLET | Freq: Every day | ORAL | 0 refills | Status: DC

## 2018-02-06 NOTE — Progress Notes (Signed)
Cardiology Office Note:    Date:  02/06/2018   ID:  Destiny Henderson, DOB 1961-09-11, MRN [id number]  PCP:  Patient, No Pcp Per  Cardiologist:  Jenean Lindau, MD   Referring MD: No ref. provider found    ASSESSMENT:    1. Chest pain, unspecified type   2. Essential hypertension, benign   3. Overweight   4. History of DVT (deep vein thrombosis)    PLAN:    In order of problems listed above:  1. Discussed my findings with the patient at extensive length and primary prevention stressed with him.  Importance of compliance with diet and medication stressed and she vocalized understanding.  I told her the importance of regulating her salt intake in her diet.  Risk factor modification was encouraged.  Lifestyle changes were also discussed with the patient. 2. Echocardiogram will be done to assess murmur heard on auscultation.  Patient will also have an exercise stress Cardiolite to assess her symptoms. 3. The patient mentions to me that multiple blood pressure medications have been tried.  I went through the list with allergies.  She has bilateral pedal edema and it is worse on amlodipine.  I want started on hydrochlorothiazide but because of sulfa allergy and allergy to furosemide which called the pharmacy and check with them.  They said that there is 15% cross-reaction for allergies with hydrochlorothiazide and furosemide.  Her only allergy with furosemide is a little rash such issues.  I explained this with her at extensive length and she is agreeable to trying the hydrochlorothiazide I have asked her to take Spironolactone and hydrochlorothiazide combination 25/25 mg half tablet daily.  I told her that this will help her pedal edema also.  We will stop her amlodipine.  She will be back in 1 week for a pulse blood pressure check and a Chem-7. 4. She will be seen in follow-up appointment in 2 months or earlier if she has any concerns.  She knows to go to the nearest emergency room for any  significant concerns.  Diet was discussed for obesity and risks of obesity explained and she vocalized understanding.   Medication Adjustments/Labs and Tests Ordered: Current medicines are reviewed at length with the patient today.  Concerns regarding medicines are outlined above.  Orders Placed This Encounter  Procedures  . Basic metabolic panel  . MYOCARDIAL PERFUSION IMAGING  . ECHOCARDIOGRAM COMPLETE   Meds ordered this encounter  Medications  . spironolactone-hydrochlorothiazide (ALDACTAZIDE) 25-25 MG tablet    Sig: Take 0.5 tablets by mouth daily.    Dispense:  30 tablet    Refill:  0    Will give more once determined if patient can tolerate.     History of Present Illness:    Destiny Henderson is a 57 y.o. female who is being seen today for the evaluation of essential hypertension and chest pain.  Patient is a pleasant 57 year old female.  She has past medical history of essential hypertension.  She mentions to me that she is not clear about her cholesterol status.  She is under a significant amount of stress.  She tells me that she has come here to be with her mother and be around her as her mother is getting older.  She quit her job and at length and has moved here for this reason.  She experiences palpitations at times when she is under stress.  She also mentions to me that she has some chest tightness not related to exertion.  Matter-of-fact she goes to the gym once or twice a week without any symptoms on the treadmill.  The symptoms consent and therefore she is here for an evaluation.  She also feels that her blood pressure is not well optimized.  Past Medical History:  Diagnosis Date  . Anemia   . Benign tumor of bladder   . DVT (deep venous thrombosis) ([place])   . Essential hypertension, benign 03/03/2013  . Fibroma of heart   . Obesity 03/03/2013    Past Surgical History:  Procedure Laterality Date  . BACK SURGERY    . bladder tack    . DILATION AND CURETTAGE OF UTERUS      after SAB  . HEART TUMOR EXCISION      Current Medications: Current Meds  Medication Sig  . acetaminophen (TYLENOL) 500 MG tablet Take 1 tablet (500 mg total) by mouth every 6 (six) hours as needed.  Marland Kitchen b complex vitamins capsule Take 1 capsule by mouth daily.  . Coenzyme Q10 (CO Q 10 PO) Take by mouth.  . ECHINACEA PO Take by mouth.  . fish oil-omega-3 fatty acids 1000 MG capsule Take 2 g by mouth daily.  Marland Kitchen FLAXSEED, LINSEED, PO Take by mouth.  . Melatonin 5 MG CAPS Take by mouth.  . Multiple Vitamins-Minerals (MEGA MULTIVITAMIN FOR WOMEN) TABS Take by mouth.  Marland Kitchen VITAMIN A OP Apply to eye.  . vitamin C (ASCORBIC ACID) 500 MG tablet Take 500 mg by mouth daily.  Marland Kitchen VITAMIN D, CHOLECALCIFEROL, PO Take by mouth.  . [DISCONTINUED] amLODipine (NORVASC) 5 MG tablet Take 1 tablet (5 mg total) by mouth daily.     Allergies:   Lisinopril; Lasix [furosemide]; Keflex [cephalexin]; Latex; and Sulfa antibiotics   Social History   Socioeconomic History  . Marital status: Married    Spouse name: Not on file  . Number of children: Not on file  . Years of education: Not on file  . Highest education level: Not on file  Occupational History  . Occupation: Network engineer  Social Needs  . Financial resource strain: Not on file  . Food insecurity:    Worry: Not on file    Inability: Not on file  . Transportation needs:    Medical: Not on file    Non-medical: Not on file  Tobacco Use  . Smoking status: Never Smoker  . Smokeless tobacco: Never Used  Substance and Sexual Activity  . Alcohol use: No  . Drug use: No  . Sexual activity: Not on file  Lifestyle  . Physical activity:    Days per week: Not on file    Minutes per session: Not on file  . Stress: Not on file  Relationships  . Social connections:    Talks on phone: Not on file    Gets together: Not on file    Attends religious service: Not on file    Active member of club or organization: Not on file    Attends meetings of clubs  or organizations: Not on file    Relationship status: Not on file  Other Topics Concern  . Not on file  Social History Narrative  . Not on file     Family History: The patient's family history includes Breast cancer in her mother; Cancer in her father, maternal aunt, maternal grandfather, and unknown relative; Colon cancer in her father; Diabetes in her maternal uncle; Heart failure in her maternal aunt and maternal grandmother; Hyperlipidemia in her mother; Hypertension in her maternal aunt,  maternal aunt, maternal uncle, and mother; Thyroid disease in her sister and sister.  ROS:   Please see the history of present illness.    All other systems reviewed and are negative.  EKGs/Labs/Other Studies Reviewed:    The following studies were reviewed today: EKG reveals sinus rhythm and nonspecific ST-T changes   Recent Labs: 06/09/2017: B Natriuretic Peptide 55.8 07/13/2017: Hemoglobin 12.3; Platelets 330 01/21/2018: BUN 13; Creatinine, Ser 1.14; Potassium 3.6; Sodium 140  Recent Lipid Panel No results found for: CHOL, TRIG, HDL, CHOLHDL, VLDL, LDLCALC, LDLDIRECT  Physical Exam:    VS:  BP 132/80 (BP Location: Left Arm, Patient Position: Sitting, Cuff Size: Normal)   Pulse 69   Ht 5\' 4"  (1.626 m)   Wt 228 lb 6.4 oz (103.6 kg)   SpO2 98%   BMI 39.20 kg/m     Wt Readings from Last 3 Encounters:  02/06/18 228 lb 6.4 oz (103.6 kg)  02/05/18 226 lb (102.5 kg)  01/22/18 220 lb (99.8 kg)     GEN: Patient is in no acute distress HEENT: Normal NECK: No JVD; No carotid bruits LYMPHATICS: No lymphadenopathy CARDIAC: S1 S2 regular, 2/6 systolic murmur at the apex. RESPIRATORY:  Clear to auscultation without rales, wheezing or rhonchi  ABDOMEN: Soft, non-tender, non-distended MUSCULOSKELETAL: Bilateral 1-2+ edema; No deformity  SKIN: Warm and dry NEUROLOGIC:  Alert and oriented x 3 PSYCHIATRIC:  Normal affect    Signed, Jenean Lindau, MD  02/06/2018 3:45 PM    [facility]

## 2018-02-06 NOTE — Patient Instructions (Addendum)
Medication Instructions:  Your physician has recommended you make the following change in your medication:  STOP amlodipine START spironolactone-hydrochlorothiazide (25-25 mg) 1/2 tablet daily  Labwork: Your physician recommends that you have the following labs drawn: BMP in one week. No appointment is necessary. Pregnancy urine test today.  Testing/Procedures: Your physician has requested that you have an echocardiogram. Echocardiography is a painless test that uses sound waves to create images of your heart. It provides your doctor with information about the size and shape of your heart and how well your heart's chambers and valves are working. This procedure takes approximately one hour. There are no restrictions for this procedure.  Your physician has requested that you have en exercise stress myoview. For further information please visit [email or web address]. Please follow instruction sheet, as given.  Follow-Up: Your physician recommends that you schedule a follow-up appointment in: June, 2019 Nurse visit in 1 week for BMP, pulse, and blood pressure check.  Any Other Special Instructions Will Be Listed Below (If Applicable).     If you need a refill on your cardiac medications before your next appointment, please call your pharmacy.   [place], RN, BSN  Hydrochlorothiazide; Spironolactone tablets What is this medicine? HYDROCHLOROTHIAZIDE; SPIRONOLACTONE (hye droe klor oh THYE a zide; speer on oh LAK tone) is a combination of two types of diuretics. It helps you make more urine and lose excess water from your body. This medicine is used to treat high blood pressure and water retention from heart, kidney, or liver disease. This medicine may be used for other purposes; ask your health care provider or pharmacist if you have questions. COMMON BRAND NAME(S): Aldactazide What should I tell my health care provider before I take this medicine? They need to know if  you have any of these conditions: -diabetes -immune system problems, like lupus -kidney disease -liver disease -an unusual reaction to hydrochlorothiazide, spironolactone, sulfa drugs, other medicines, foods, dyes, or preservatives -breast-feeding -pregnant or trying to get pregnant How should I use this medicine? Take this medicine by mouth with a glass of water. Follow the directions on the prescription label. You can take it with or without food. If it upsets your stomach, take it with food. Take your medicine at regular intervals. Remember that you will need to pass urine frequently after taking this medicine. Do not take your doses at a time of day that will cause you problems. Do not take at bedtime. Do not take your medicine more often than directed. Do not stop taking except on your doctor's advice. Talk to your pediatrician regarding the use of this medicine in children. Special care may be needed. Overdosage: If you think you have taken too much of this medicine contact a poison control center or emergency room at once. NOTE: This medicine is only for you. Do not share this medicine with others. What if I miss a dose? If you miss a dose, take it as soon as you can. If it is almost time for your next dose, take only that dose. Do not take double or extra doses. What may interact with this medicine? Do not take this medicine with any of the following medications: -ACE inhibitors like captopril, lisinopril -eplerenone -lithium -medicine for pain and inflammation like indomethacin -other diuretics -potassium supplements This medicine may also interact with the following medications: -alcohol -barbiturate medicines for sleep or seizure control -digoxin -medicines for diabetes -medicines for high blood pressure or heart failure -muscle relaxers -neuromuscular blockers  used during surgery -some medicines for pain like codeine -steroid hormones like cortisone, hydrocortisone,  prednisone This list may not describe all possible interactions. Give your health care provider a list of all the medicines, herbs, non-prescription drugs, or dietary supplements you use. Also tell them if you smoke, drink alcohol, or use illegal drugs. Some items may interact with your medicine. What should I watch for while using this medicine? Visit your doctor or health care professional for regular checks on your progress. Check your blood pressure as directed. Ask your doctor what your blood pressure should be, and when you should contact them. If you are diabetic, check your blood sugar as directed. You must not get dehydrated. Ask your doctor or health care professional how much fluid you need to drink a day. Check with him or her if you get an attack of severe diarrhea, nausea and vomiting, or if you sweat a lot. The loss of too much body fluid can make it dangerous for you to take this medicine. You may get drowsy or dizzy. Do not drive, use machinery, or do anything that needs mental alertness until you know how this medicine affects you. Do not stand or sit up quickly, especially if you are an older patient. This reduces the risk of dizzy or fainting spells. Alcohol may interfere with the effect of this medicine. Avoid alcoholic drinks. This medicine can make you more sensitive to the sun. Keep out of the sun. If you cannot avoid being in the sun, wear protective clothing and use sunscreen. Do not use sun lamps or tanning beds/booths. What side effects may I notice from receiving this medicine? Side effects that you should report to your doctor or health care professional as soon as possible: -allergic reactions such as skin rash or itching, hives, swelling of the lips, mouth, tongue, or throat -black or tarry stools -changes in vision -difficulty breathing -eye pain -fast, irregular heartbeat -feeling faint or dizzy -fever -gout pain -muscle pain, cramps -numbness, tingling in hands  or feet -redness, blistering, peeling or loosening of the skin, including inside the mouth -trouble passing urine -unusual bleeding -unusually weak or tired -yellowing of the eyes or skin Side effects that usually do not require medical attention (report to your doctor or health care professional if they continue or are bothersome): -breast enlargement in both males and females -change in sex drive or performance -constipation -dry mouth, increased thirst -headache -irregular menstrual periods -stomach upset, nausea This list may not describe all possible side effects. Call your doctor for medical advice about side effects. You may report side effects to FDA at 1-800-FDA-1088. Where should I keep my medicine? Keep out of the reach of children. Store below 25 degrees C (77 degrees F). Throw away any unused medicine after the expiration date. NOTE: This sheet is a summary. It may not cover all possible information. If you have questions about this medicine, talk to your doctor, pharmacist, or health care provider.  2018 Elsevier/Gold Standard (2010-06-08 11:52:45)  Echocardiogram An echocardiogram, or echocardiography, uses sound waves (ultrasound) to produce an image of your heart. The echocardiogram is simple, painless, obtained within a short period of time, and offers valuable information to your health care provider. The images from an echocardiogram can provide information such as:  Evidence of coronary artery disease (CAD).  Heart size.  Heart muscle function.  Heart valve function.  Aneurysm detection.  Evidence of a past heart attack.  Fluid buildup around the heart.  Heart muscle  thickening.  Assess heart valve function.  Tell a health care provider about:  Any allergies you have.  All medicines you are taking, including vitamins, herbs, eye drops, creams, and over-the-counter medicines.  Any problems you or family members have had with anesthetic  medicines.  Any blood disorders you have.  Any surgeries you have had.  Any medical conditions you have.  Whether you are pregnant or may be pregnant. What happens before the procedure? No special preparation is needed. Eat and drink normally. What happens during the procedure?  In order to produce an image of your heart, gel will be applied to your chest and a wand-like tool (transducer) will be moved over your chest. The gel will help transmit the sound waves from the transducer. The sound waves will harmlessly bounce off your heart to allow the heart images to be captured in real-time motion. These images will then be recorded.  You may need an IV to receive a medicine that improves the quality of the pictures. What happens after the procedure? You may return to your normal schedule including diet, activities, and medicines, unless your health care provider tells you otherwise. This information is not intended to replace advice given to you by your health care provider. Make sure you discuss any questions you have with your health care provider. Document Released: 09/21/2000 Document Revised: 05/12/2016 Document Reviewed: 06/01/2013 Elsevier Interactive Patient Education  2017 [employer].

## 2018-02-07 ENCOUNTER — Encounter (INDEPENDENT_AMBULATORY_CARE_PROVIDER_SITE_OTHER): Payer: Self-pay

## 2018-02-07 LAB — PREGNANCY, URINE: Preg Test, Ur: NEGATIVE

## 2018-02-07 LAB — CYTOLOGY - PAP
CHLAMYDIA, DNA PROBE: NEGATIVE
DIAGNOSIS: NEGATIVE
HPV (WINDOPATH): NOT DETECTED
NEISSERIA GONORRHEA: NEGATIVE

## 2018-02-07 NOTE — Addendum Note (Signed)
Addended by: Tarri Glenn on: 02/07/2018 02:57 PM   Modules accepted: Orders

## 2018-02-09 ENCOUNTER — Encounter (INDEPENDENT_AMBULATORY_CARE_PROVIDER_SITE_OTHER): Payer: Self-pay

## 2018-02-10 ENCOUNTER — Ambulatory Visit: Payer: PRIVATE HEALTH INSURANCE

## 2018-02-10 ENCOUNTER — Ambulatory Visit: Payer: PRIVATE HEALTH INSURANCE | Admitting: Family Medicine

## 2018-02-11 ENCOUNTER — Encounter: Payer: Self-pay | Admitting: Family Medicine

## 2018-02-11 ENCOUNTER — Ambulatory Visit (HOSPITAL_BASED_OUTPATIENT_CLINIC_OR_DEPARTMENT_OTHER)
Admission: RE | Admit: 2018-02-11 | Discharge: 2018-02-11 | Disposition: A | Discharge status: Home / Self Care | Payer: PRIVATE HEALTH INSURANCE | Source: Ambulatory Visit | Attending: Family Medicine | Admitting: Family Medicine

## 2018-02-11 ENCOUNTER — Ambulatory Visit (INDEPENDENT_AMBULATORY_CARE_PROVIDER_SITE_OTHER): Payer: PRIVATE HEALTH INSURANCE | Admitting: Family Medicine

## 2018-02-11 VITALS — BP 139/86 | HR 66 | Ht 64.0 in | Wt 226.0 lb

## 2018-02-11 DIAGNOSIS — S8991XD Unspecified injury of right lower leg, subsequent encounter: Secondary | ICD-10-CM

## 2018-02-11 DIAGNOSIS — M545 Low back pain, unspecified: Secondary | ICD-10-CM

## 2018-02-11 DIAGNOSIS — S6991XD Unspecified injury of right wrist, hand and finger(s), subsequent encounter: Secondary | ICD-10-CM

## 2018-02-11 DIAGNOSIS — M79604 Pain in right leg: Secondary | ICD-10-CM

## 2018-02-11 MED ORDER — DICLOFENAC SODIUM 75 MG PO TBEC: 75.0000 mg | DELAYED_RELEASE_TABLET | Freq: Two times a day (BID) | ORAL | 1 refills | Status: DC

## 2018-02-11 NOTE — Patient Instructions (Addendum)
We will go ahead with an MRI of your low back given the weakness in your right leg and severity of the pain. We will also do an MRI of your right wrist to assess for an occult scaphoid fracture or scapholunate dissociation. Your knee exam is reassuring, no worries here based on your exam - still consistent with a contusion that should heal with time. voltaren twice a day with food for pain and inflammation. Icing as needed 15 minutes at a time. Ok to take tylenol with this, topical medications too.

## 2018-02-13 ENCOUNTER — Ambulatory Visit: Payer: PRIVATE HEALTH INSURANCE

## 2018-02-13 ENCOUNTER — Telehealth: Payer: Self-pay | Admitting: Family Medicine

## 2018-02-13 ENCOUNTER — Telehealth (HOSPITAL_COMMUNITY): Payer: Self-pay | Admitting: *Deleted

## 2018-02-13 MED ORDER — METHOCARBAMOL 500 MG PO TABS: 500.0000 mg | ORAL_TABLET | Freq: Three times a day (TID) | ORAL | 1 refills | Status: DC | PRN

## 2018-02-13 NOTE — Telephone Encounter (Signed)
Patient called stating she is experiencing extreme pain in her back. States she has been unable to get up out of her bed due to the pain. She tried pushing herself up and thinks she re-injured her right wrist in the process.   She has been having spasms in her leg and back throughout the night. The pain from her back is shooting down through her right buttock. She took her blood pressure one hour ago which was 186/110.   Patient states she has been taking aleve and tylenol. States the pain medication makes her sick to her stomach and she does not want to take it due to having holistic beliefs

## 2018-02-13 NOTE — Telephone Encounter (Signed)
We discussed the different things she could do for pain but as she stated she doesn't like to take much because of her holistic beliefs.    Normally we'd try her on a steroid dose pack, muscle relaxant, possibly a pain medication.  We're going ahead with MRIs as well but those will take a little bit to get scheduled and for her to get them done.

## 2018-02-13 NOTE — Telephone Encounter (Signed)
Patient given detailed instructions per Myocardial Perfusion Study Information Sheet for the test on 02/18/18. Patient notified to arrive 15 minutes early and that it is imperative to arrive on time for appointment to keep from having the test rescheduled.  If you need to cancel or reschedule your appointment, please call the office within 24 hours of your appointment. . Patient verbalized understanding. Destiny Henderson Jacqueline    

## 2018-02-13 NOTE — Telephone Encounter (Signed)
Sent robaxin downstairs.  Thanks!

## 2018-02-13 NOTE — Telephone Encounter (Signed)
Spoke to patient and informed her of the information provided by the physician. Patient would like to try a muscle relaxant. Please send to the Outpatient Pharmacy downstairs.

## 2018-02-14 ENCOUNTER — Ambulatory Visit: Payer: PRIVATE HEALTH INSURANCE | Admitting: Family Medicine

## 2018-02-14 DIAGNOSIS — Z0289 Encounter for other administrative examinations: Secondary | ICD-10-CM

## 2018-02-14 NOTE — Telephone Encounter (Signed)
Patient was informed of medication being sent to pharmacy

## 2018-02-17 ENCOUNTER — Encounter: Payer: Self-pay | Admitting: Family Medicine

## 2018-02-17 ENCOUNTER — Ambulatory Visit: Payer: PRIVATE HEALTH INSURANCE | Admitting: Family Medicine

## 2018-02-17 DIAGNOSIS — M545 Low back pain, unspecified: Secondary | ICD-10-CM

## 2018-02-17 DIAGNOSIS — M79604 Pain in right leg: Secondary | ICD-10-CM | POA: Insufficient documentation

## 2018-02-17 DIAGNOSIS — Z0289 Encounter for other administrative examinations: Secondary | ICD-10-CM

## 2018-02-17 HISTORY — DX: Pain in right leg: M79.604

## 2018-02-17 HISTORY — DX: Low back pain, unspecified: M54.50
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
## 2018-02-17 NOTE — Assessment & Plan Note (Signed)
concerning for lumbar radiculopathy with weakness in her leg as well - ? If limited due to pain though.  Will go ahead with MRI to further assess.  Voltaren, icing or heat, tylenol, topical medications.

## 2018-02-17 NOTE — Assessment & Plan Note (Signed)
radiographs negative.  Consistent with contusion.  Reassured.  Tylenol, aleve or advil, icing if needed.

## 2018-02-18 ENCOUNTER — Encounter (HOSPITAL_COMMUNITY): Payer: PRIVATE HEALTH INSURANCE

## 2018-02-18 ENCOUNTER — Other Ambulatory Visit (HOSPITAL_COMMUNITY): Payer: PRIVATE HEALTH INSURANCE

## 2018-02-21 ENCOUNTER — Encounter: Payer: Self-pay | Admitting: Family Medicine

## 2018-02-21 MED ORDER — PREDNISONE 20 MG PO TABS: 40.0000 mg | ORAL_TABLET | Freq: Every day | ORAL | 0 refills | Status: DC
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
## 2018-02-24 ENCOUNTER — Telehealth (HOSPITAL_COMMUNITY): Payer: Self-pay | Admitting: *Deleted

## 2018-02-24 NOTE — Telephone Encounter (Signed)
Left message on voicemail in reference to upcoming appointment scheduled for 02/26/18. Phone number given for a call back so details instructions can be given. Kirstie Peri

## 2018-02-26 ENCOUNTER — Encounter (HOSPITAL_COMMUNITY): Payer: PRIVATE HEALTH INSURANCE

## 2018-02-26 ENCOUNTER — Ambulatory Visit (HOSPITAL_COMMUNITY): Payer: PRIVATE HEALTH INSURANCE

## 2018-02-28 ENCOUNTER — Telehealth: Payer: Self-pay

## 2018-02-28 NOTE — Telephone Encounter (Signed)
Called patient regarding overdue labs and patient  wasn't present so left a  voicemail

## 2018-03-10 ENCOUNTER — Ambulatory Visit: Payer: PRIVATE HEALTH INSURANCE | Admitting: Cardiology

## 2018-03-10 ENCOUNTER — Encounter: Payer: Self-pay | Admitting: Cardiology

## 2018-03-17 ENCOUNTER — Telehealth: Payer: Self-pay | Admitting: Family Medicine

## 2018-03-17 NOTE — Telephone Encounter (Signed)
Patient just wanted to let us know that her MRI has been rescheduled to 04/17/18 at 3:30.

## 2018-03-20 ENCOUNTER — Telehealth: Payer: Self-pay | Admitting: General Practice

## 2018-03-20 NOTE — Telephone Encounter (Signed)
Spoke with patient.  She was offered an appointment yesterday in the [facility] office with me given her level of pain - she declined and stated she was going to the ED.  I told her this and that I checked to see if she had gone to the ED - she denies she was offered an appointment and 'has been calling [me] since last week asking for me to call back.'    Only other phone call from patient was on 6/10 to inform us they moved her MRIs to 6/11, not requesting a call back from me.  I went over her MRIs (only have reports, will get disc of images) which don't show evidence of findings to account for her pain in back going into right leg but I want to see the disc myself - will try to get this tomorrow.  She scheduled follow-up for Tuesday afternoon at 3:30pm.

## 2018-03-20 NOTE — Telephone Encounter (Signed)
Patient called and left voicemail at lunch time just saying she needed to speak to the doctor.  When I called her back she said she had called last Thursday and each day this week and no one had called her back.  I informed her the voicemail was not working Monday and Tuesday and that Wednesday Dr. Barbaraann Barthel was in [place] so office here is closed.  She said she spoke to someone Wednesday who informed her of this.  The only time I spoke to her before today was when she was transferred through to [name] earlier in the week from a [facility] site and she said she just wanted to give [name] an fyi about the change of the MRI date. (in chart)  She was very upset and also very rude and refused to identify herself with her birthdate and was not really listening to what I was saying.  She would like Dr. Barbaraann Barthel to call her back but would not say why (other than I assume to complain, not sure)  thanks

## 2018-03-20 NOTE — Telephone Encounter (Signed)
Patient called back and was told that Dr. Barbaraann Barthel had a message for her but she would not listen to the message because she only wanted to speak to Dr. Barbaraann Barthel.  She was extremely rude and hung up.  She then called back wanting to speak to [place].  Nevin Bloodgood said she was busy and that either her or Dr. Barbaraann Barthel would call her back.  When I informed the patient she just said 'well I have heard that before and you trying to tell me he going to call me back but he never called me back and don't expect me to make another appointment.  I'm going somewhere else'  She continued on with this for a few minutes and finally allowed me to tell her Dr. Barbaraann Barthel would call her back later today.

## 2018-03-21 NOTE — Telephone Encounter (Signed)
I reviewed the MRI images myself after picking up CD.  No evidence acute findings or right sided radiculopathy to account for her symptoms.  Plan to see her in the office on Tuesday as scheduled.    Also see prior documentation note from 5/17 - she was asked to move her MRIs up to within that week at the latest given her level of pain along with her out of work status.  She declined to do so.

## 2018-03-25 ENCOUNTER — Ambulatory Visit: Payer: PRIVATE HEALTH INSURANCE | Admitting: Family Medicine

## 2018-03-26 ENCOUNTER — Encounter: Payer: Self-pay | Admitting: Family Medicine

## 2018-03-26 ENCOUNTER — Encounter: Payer: Self-pay | Admitting: Cardiology

## 2018-03-26 NOTE — Telephone Encounter (Signed)
I spoke to patient at end of day yesterday, 6/18.  She was supposed to come in for follow up and she canceled, moved appointment to 6/25.  I asked why and she said 'because I'm going to get a second opinion' but has scheduled the second opinion for 6/26.  I am concerned she has delayed her imaging and follow-ups to remain out of work.  I wrote her out of work for only 2 weeks to get imaging for her severe wrist and back pain, told her to get these within a week and she kept imaging appointments a month from her last visit with me.  Now without reason she has delayed her follow-up in the office with me and stated I haven't cleared her to return to work yet.  I will not excuse her for time missed beyond her last note as she hasn't followed through with my recommendations.

## 2018-03-31 ENCOUNTER — Encounter: Payer: Self-pay | Admitting: Family Medicine

## 2018-03-31 ENCOUNTER — Ambulatory Visit (INDEPENDENT_AMBULATORY_CARE_PROVIDER_SITE_OTHER): Payer: PRIVATE HEALTH INSURANCE | Admitting: Family Medicine

## 2018-03-31 DIAGNOSIS — M79604 Pain in right leg: Secondary | ICD-10-CM

## 2018-03-31 DIAGNOSIS — M545 Low back pain, unspecified: Secondary | ICD-10-CM

## 2018-03-31 NOTE — Patient Instructions (Signed)
I will fill out your return to work paperwork that you emailed to [name] and we will call you when this is complete. There isn't anything on your MRI that would explain your current pain, out of work status. You can keep your appointment for a second opinion as well.

## 2018-03-31 NOTE — Assessment & Plan Note (Signed)
Independently reviewed MRI in addition to report.  Patient's description of pain has changed and reports midline and left paraspinal pain now - just a few days ago she reported to me she was struggling with pain into right leg and right side of back still.  Combination of history, exam, MRI results suggest pain is nonanatomic.  Weakness on exam in right leg is throughout entire leg and not consistent with a radiculopathy or peripheral neuropathy.  See prior notes in chart for additional details.  Total visit time 15 minutes - > 50% of visit spent on counseling, return to work status.
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
## 2018-04-01 ENCOUNTER — Ambulatory Visit: Payer: PRIVATE HEALTH INSURANCE | Admitting: Family Medicine

## 2018-04-14 ENCOUNTER — Telehealth (HOSPITAL_COMMUNITY): Payer: Self-pay | Admitting: Cardiology

## 2018-04-14 NOTE — Telephone Encounter (Signed)
03/24/18 Called pt and number is not in service..RG 03/26/18 PATIENT CANCEL ON 5/14 AND 02/26/18/LETTER MAILED/D.MILLER 04/14/18 Called pt and lmsg for her to CB to get r/s for stress test..RG

## 2018-04-15 ENCOUNTER — Telehealth: Payer: Self-pay

## 2018-04-15 NOTE — Telephone Encounter (Signed)
Patient called and spoke with front desk and then asked to speak with a nurse.  Patient states she is upset because she wants to be seen sooner than our first available. Patient states that she is having urinary frequency and thinks she may have UTI. I offered her a nurse visit for this to collect a urine sample during our normal business hours (Mon-Thursday 8-5p with exception of 12-1 for lunch or Friday 8-12) patient states that due to work she wont be able to come in those hours except for [employer]. Made patient aware she could try somewhere with extended hours like urgent care or check with her PCP about extended hours.  Patient then states she wants "to be checked". Her last annual was on 02-05-18. Placed patient on hold to pull up her appointments in the computer and patient hung up while on hold. Kathrene Alu RN

## 2018-04-18 ENCOUNTER — Other Ambulatory Visit (HOSPITAL_COMMUNITY): Payer: PRIVATE HEALTH INSURANCE

## 2018-05-11 ENCOUNTER — Encounter (HOSPITAL_BASED_OUTPATIENT_CLINIC_OR_DEPARTMENT_OTHER): Payer: Self-pay | Admitting: Emergency Medicine

## 2018-05-11 ENCOUNTER — Other Ambulatory Visit: Payer: Self-pay

## 2018-05-11 ENCOUNTER — Emergency Department (HOSPITAL_BASED_OUTPATIENT_CLINIC_OR_DEPARTMENT_OTHER): Payer: PRIVATE HEALTH INSURANCE

## 2018-05-11 ENCOUNTER — Emergency Department (HOSPITAL_BASED_OUTPATIENT_CLINIC_OR_DEPARTMENT_OTHER)
Admission: EM | Admit: 2018-05-11 | Discharge: 2018-05-11 | Disposition: A | Discharge status: Home / Self Care | Payer: PRIVATE HEALTH INSURANCE | Attending: Emergency Medicine | Admitting: Emergency Medicine

## 2018-05-11 DIAGNOSIS — G44209 Tension-type headache, unspecified, not intractable: Secondary | ICD-10-CM

## 2018-05-11 DIAGNOSIS — Z79899 Other long term (current) drug therapy: Secondary | ICD-10-CM | POA: Diagnosis not present

## 2018-05-11 DIAGNOSIS — I10 Essential (primary) hypertension: Secondary | ICD-10-CM | POA: Insufficient documentation

## 2018-05-11 DIAGNOSIS — Z91040 Latex allergy status: Secondary | ICD-10-CM | POA: Diagnosis not present

## 2018-05-11 DIAGNOSIS — R0789 Other chest pain: Secondary | ICD-10-CM

## 2018-05-11 DIAGNOSIS — N3000 Acute cystitis without hematuria: Secondary | ICD-10-CM | POA: Diagnosis not present

## 2018-05-11 LAB — BASIC METABOLIC PANEL
ANION GAP: 7 (ref 5–15)
BUN: 12 mg/dL (ref 6–20)
CO2: 26 mmol/L (ref 22–32)
Calcium: 8.7 mg/dL — ABNORMAL LOW (ref 8.9–10.3)
Chloride: 108 mmol/L (ref 98–111)
Creatinine, Ser: 0.83 mg/dL (ref 0.44–1.00)
Glucose, Bld: 97 mg/dL (ref 70–99)
POTASSIUM: 3.6 mmol/L (ref 3.5–5.1)
SODIUM: 141 mmol/L (ref 135–145)

## 2018-05-11 LAB — CBC
HEMATOCRIT: 36.3 % (ref 36.0–46.0)
HEMOGLOBIN: 12.1 g/dL (ref 12.0–15.0)
MCH: 29.4 pg (ref 26.0–34.0)
MCHC: 33.3 g/dL (ref 30.0–36.0)
MCV: 88.1 fL (ref 78.0–100.0)
Platelets: 335 10*3/uL (ref 150–400)
RBC: 4.12 MIL/uL (ref 3.87–5.11)
RDW: 15 % (ref 11.5–15.5)
WBC: 6.7 10*3/uL (ref 4.0–10.5)

## 2018-05-11 LAB — URINALYSIS, MICROSCOPIC (REFLEX)

## 2018-05-11 LAB — URINALYSIS, ROUTINE W REFLEX MICROSCOPIC
Bilirubin Urine: NEGATIVE
Glucose, UA: NEGATIVE mg/dL
Ketones, ur: NEGATIVE mg/dL
Leukocytes, UA: NEGATIVE
NITRITE: POSITIVE — AB
PH: 5.5 (ref 5.0–8.0)
Protein, ur: NEGATIVE mg/dL
SPECIFIC GRAVITY, URINE: 1.025 (ref 1.005–1.030)

## 2018-05-11 LAB — TROPONIN I: Troponin I: <0.03 ng/mL (ref <0.03)

## 2018-05-11 LAB — PREGNANCY, URINE: Preg Test, Ur: NEGATIVE

## 2018-05-11 MED ORDER — NITROFURANTOIN MONOHYD MACRO 100 MG PO CAPS: 100.0000 mg | ORAL_CAPSULE | Freq: Two times a day (BID) | ORAL | 0 refills | Status: DC

## 2018-05-11 MED ORDER — FLUCONAZOLE 150 MG PO TABS: 150.0000 mg | ORAL_TABLET | Freq: Once | ORAL | 0 refills | Status: AC

## 2018-05-11 MED ORDER — ONDANSETRON HCL 4 MG/2ML IJ SOLN
4.0000 mg | Freq: Once | INTRAMUSCULAR | Status: AC
  Administered 2018-05-11: 4 mg via INTRAVENOUS
  Filled 2018-05-11: qty 2

## 2018-05-11 NOTE — ED Triage Notes (Signed)
L side chest pain, SOB with exertion and HTN for several days.
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
## 2018-05-14 ENCOUNTER — Telehealth: Payer: Self-pay

## 2018-05-14 LAB — URINE CULTURE

## 2018-05-14 NOTE — Telephone Encounter (Signed)
Called patient at [phone] because left message with after hours line that she is having severe abdominal pain and that she was seen in ED and they told her "IUD is indulged and she might have to have it removed" and she is requesting an appointment for Friday.   Left message for patient to return call to office. Kathrene Alu RN

## 2018-05-15 ENCOUNTER — Telehealth: Payer: Self-pay | Admitting: *Deleted

## 2018-05-15 NOTE — Telephone Encounter (Signed)
Post ED Visit - Positive Culture Follow-up  Culture report reviewed by antimicrobial stewardship pharmacist:  []  Elenor Quinones, Pharm.D. []  Heide Guile, Pharm.D., BCPS AQ-ID []  Parks Neptune, Pharm.D., BCPS []  Alycia Rossetti, Pharm.D., BCPS []  [place], Pharm.D., BCPS, AAHIVP []  Legrand Como, Pharm.D., BCPS, AAHIVP []  Salome Arnt, PharmD, BCPS []  Johnnette Gourd, PharmD, BCPS []  Hughes Better, PharmD, BCPS []  Leeroy Cha, PharmD Lynford Humphrey, PharmD  Positive urine culture Treated with Nitrofurantoin Monohyd Macro, organism sensitive to the same and no further patient follow-up is required at this time.  Harlon Flor [facility] 05/15/2018, 11:09 AM

## 2018-05-21 ENCOUNTER — Ambulatory Visit: Payer: PRIVATE HEALTH INSURANCE | Admitting: Medical

## 2018-05-21 DIAGNOSIS — R0789 Other chest pain: Secondary | ICD-10-CM | POA: Insufficient documentation

## 2018-05-21 DIAGNOSIS — Z862 Personal history of diseases of the blood and blood-forming organs and certain disorders involving the immune mechanism: Secondary | ICD-10-CM

## 2018-05-21 DIAGNOSIS — E782 Mixed hyperlipidemia: Secondary | ICD-10-CM | POA: Insufficient documentation

## 2018-05-21 DIAGNOSIS — Z0289 Encounter for other administrative examinations: Secondary | ICD-10-CM

## 2018-05-21 HISTORY — DX: Mixed hyperlipidemia: E78.2

## 2018-05-21 HISTORY — DX: Personal history of diseases of the blood and blood-forming organs and certain disorders involving the immune mechanism: Z86.2

## 2018-05-21 HISTORY — DX: Other chest pain: R07.89

## 2018-05-21 MED FILL — CIPROFLOXACIN HCL 500 MG TA: 500 | 5 days supply | Qty: 10 | Fill #0

## 2018-05-21 MED FILL — FLUCONAZOLE 150 MG TABS: 150 | 1 days supply | Qty: 1 | Fill #0

## 2018-05-21 MED FILL — SPIRONOLACTONE-HCTZ 25-25 T: 25-25 | 60 days supply | Qty: 30 | Fill #0

## 2018-05-23 ENCOUNTER — Telehealth: Payer: Self-pay | Admitting: *Deleted

## 2018-05-23 NOTE — Telephone Encounter (Signed)
Copied from [place] [id number]. Topic: Appointment Scheduling - Scheduling Inquiry for Clinic >> May 23, 2018  3:42 PM Reyne Dumas L wrote: Pt calling to know if she can reschedule her no show appointment, new pt.  Pt can be reached at [phone]

## 2018-05-23 NOTE — Telephone Encounter (Signed)
Pt was scheduled for new pt visit with Mackie Pai, PA-C on 05/21/18 and was a no show. She is requesting to schedule another new pt appt with you.  Please advise if ok to schedule again?

## 2018-05-24 NOTE — Telephone Encounter (Signed)
If she called and had legitimate excuse then ok to schedule but if no record of call and just did not show then would prefer that she not be rescheduled with me.

## 2018-05-26 ENCOUNTER — Ambulatory Visit (HOSPITAL_COMMUNITY): Payer: PRIVATE HEALTH INSURANCE | Attending: Cardiology

## 2018-05-26 DIAGNOSIS — R0989 Other specified symptoms and signs involving the circulatory and respiratory systems: Secondary | ICD-10-CM

## 2018-05-26 NOTE — Telephone Encounter (Signed)
Maudie Mercury -- can you help with this?

## 2018-05-30 ENCOUNTER — Ambulatory Visit: Payer: PRIVATE HEALTH INSURANCE | Admitting: Cardiology

## 2018-06-02 ENCOUNTER — Encounter: Payer: Self-pay | Admitting: Cardiology

## 2018-06-04 ENCOUNTER — Emergency Department (HOSPITAL_BASED_OUTPATIENT_CLINIC_OR_DEPARTMENT_OTHER)
Admission: EM | Admit: 2018-06-04 | Discharge: 2018-06-04 | Disposition: A | Discharge status: Home / Self Care | Payer: PRIVATE HEALTH INSURANCE | Attending: Emergency Medicine | Admitting: Emergency Medicine

## 2018-06-04 ENCOUNTER — Encounter (HOSPITAL_BASED_OUTPATIENT_CLINIC_OR_DEPARTMENT_OTHER): Payer: Self-pay

## 2018-06-04 ENCOUNTER — Emergency Department (HOSPITAL_BASED_OUTPATIENT_CLINIC_OR_DEPARTMENT_OTHER): Payer: PRIVATE HEALTH INSURANCE

## 2018-06-04 ENCOUNTER — Other Ambulatory Visit: Payer: Self-pay

## 2018-06-04 DIAGNOSIS — R112 Nausea with vomiting, unspecified: Secondary | ICD-10-CM | POA: Diagnosis not present

## 2018-06-04 DIAGNOSIS — Z91040 Latex allergy status: Secondary | ICD-10-CM | POA: Diagnosis not present

## 2018-06-04 DIAGNOSIS — R1030 Lower abdominal pain, unspecified: Secondary | ICD-10-CM | POA: Diagnosis present

## 2018-06-04 DIAGNOSIS — R197 Diarrhea, unspecified: Secondary | ICD-10-CM | POA: Diagnosis not present

## 2018-06-04 DIAGNOSIS — Z79899 Other long term (current) drug therapy: Secondary | ICD-10-CM | POA: Diagnosis not present

## 2018-06-04 DIAGNOSIS — I10 Essential (primary) hypertension: Secondary | ICD-10-CM | POA: Diagnosis not present

## 2018-06-04 DIAGNOSIS — N3090 Cystitis, unspecified without hematuria: Secondary | ICD-10-CM | POA: Diagnosis not present

## 2018-06-04 DIAGNOSIS — N3000 Acute cystitis without hematuria: Secondary | ICD-10-CM

## 2018-06-04 LAB — URINALYSIS, MICROSCOPIC (REFLEX)

## 2018-06-04 LAB — CBC WITH DIFFERENTIAL/PLATELET
Basophils Absolute: 0 10*3/uL (ref 0.0–0.1)
Basophils Relative: 0 %
Eosinophils Absolute: 0.3 10*3/uL (ref 0.0–0.7)
Eosinophils Relative: 5 %
HCT: 35.7 % — ABNORMAL LOW (ref 36.0–46.0)
Hemoglobin: 11.8 g/dL — ABNORMAL LOW (ref 12.0–15.0)
Lymphocytes Relative: 40 %
Lymphs Abs: 2.9 10*3/uL (ref 0.7–4.0)
MCH: 29.6 pg (ref 26.0–34.0)
MCHC: 33.1 g/dL (ref 30.0–36.0)
MCV: 89.7 fL (ref 78.0–100.0)
Monocytes Absolute: 0.7 10*3/uL (ref 0.1–1.0)
Monocytes Relative: 9 %
Neutro Abs: 3.3 10*3/uL (ref 1.7–7.7)
Neutrophils Relative %: 46 %
Platelets: 317 10*3/uL (ref 150–400)
RBC: 3.98 MIL/uL (ref 3.87–5.11)
RDW: 15.1 % (ref 11.5–15.5)
WBC: 7.2 10*3/uL (ref 4.0–10.5)

## 2018-06-04 LAB — COMPREHENSIVE METABOLIC PANEL
ALT: 20 U/L (ref 0–44)
AST: 23 U/L (ref 15–41)
Albumin: 3.6 g/dL (ref 3.5–5.0)
Alkaline Phosphatase: 47 U/L (ref 38–126)
Anion gap: 8 (ref 5–15)
BUN: 15 mg/dL (ref 6–20)
CO2: 28 mmol/L (ref 22–32)
Calcium: 8.8 mg/dL — ABNORMAL LOW (ref 8.9–10.3)
Chloride: 106 mmol/L (ref 98–111)
Creatinine, Ser: 1.01 mg/dL — ABNORMAL HIGH (ref 0.44–1.00)
GFR calc Af Amer: >60 mL/min (ref >60)
GFR calc non Af Amer: >60 mL/min (ref >60)
Glucose, Bld: 128 mg/dL — ABNORMAL HIGH (ref 70–99)
Potassium: 3.9 mmol/L (ref 3.5–5.1)
Sodium: 142 mmol/L (ref 135–145)
Total Bilirubin: 0.3 mg/dL (ref 0.3–1.2)
Total Protein: 7.1 g/dL (ref 6.5–8.1)

## 2018-06-04 LAB — URINALYSIS, ROUTINE W REFLEX MICROSCOPIC
Bilirubin Urine: NEGATIVE
Glucose, UA: NEGATIVE mg/dL
Ketones, ur: NEGATIVE mg/dL
Leukocytes, UA: NEGATIVE
Nitrite: NEGATIVE
Protein, ur: NEGATIVE mg/dL
Specific Gravity, Urine: 1.025 (ref 1.005–1.030)
pH: 6 (ref 5.0–8.0)

## 2018-06-04 LAB — PREGNANCY, URINE: Preg Test, Ur: NEGATIVE

## 2018-06-04 LAB — LIPASE, BLOOD: Lipase: 37 U/L (ref 11–51)

## 2018-06-04 MED ORDER — ONDANSETRON HCL 4 MG/2ML IJ SOLN
4.0000 mg | Freq: Once | INTRAMUSCULAR | Status: AC
  Administered 2018-06-04: 4 mg via INTRAVENOUS
  Filled 2018-06-04: qty 2

## 2018-06-04 MED ORDER — IOPAMIDOL (ISOVUE-300) INJECTION 61%
30.0000 mL | Freq: Once | INTRAVENOUS | Status: AC | PRN
  Administered 2018-06-04: 15 mL via ORAL

## 2018-06-04 MED ORDER — IOPAMIDOL (ISOVUE-300) INJECTION 61%
100.0000 mL | Freq: Once | INTRAVENOUS | Status: AC | PRN
  Administered 2018-06-04: 100 mL via INTRAVENOUS

## 2018-06-04 MED ORDER — SODIUM CHLORIDE 0.9 % IV BOLUS
1000.0000 mL | Freq: Once | INTRAVENOUS | Status: AC
  Administered 2018-06-04: 1000 mL via INTRAVENOUS

## 2018-06-04 MED ORDER — CIPROFLOXACIN HCL 250 MG PO TABS: 250.0000 mg | ORAL_TABLET | Freq: Two times a day (BID) | ORAL | 0 refills | Status: AC

## 2018-06-04 MED ORDER — ONDANSETRON 4 MG PO TBDP: 4.0000 mg | ORAL_TABLET | Freq: Three times a day (TID) | ORAL | 0 refills | Status: AC | PRN

## 2018-06-04 MED FILL — ONDANSETRON ODT 4 MG TABLET: 4 | 3 days supply | Qty: 9 | Fill #0

## 2018-06-04 MED FILL — CIPROFLOXACIN HCL 250 MG TA: 250 | 3 days supply | Qty: 6 | Fill #0

## 2018-06-04 NOTE — Discharge Instructions (Addendum)
1. Medications: Please take all of your antibiotics until finished!   You may develop abdominal discomfort or diarrhea from the antibiotic.  You may help offset this with probiotics which you can buy or get in yogurt. Do not eat  or take the probiotics until 2 hours after your antibiotic. Take Zofran as needed for nausea.  Wait around 20 minutes before eating or drinking after taking this medication. 2. Treatment: rest, drink plenty of fluids, advance diet slowly.  Start with water and broth then advance to bland foods that will not upset your stomach such as crackers, mashed potatoes, and peanut butter. 3. Follow Up: Please followup with your primary doctor in 3 days for discussion of your diagnoses and further evaluation after today's visit; if you do not have a primary care doctor use the resource guide provided to find one; Please return to the ER for persistent vomiting, high fevers or worsening symptoms

## 2018-06-04 NOTE — ED Triage Notes (Signed)
C/o n/v/d x 5 days-NAD-steady gait

## 2018-06-04 NOTE — ED Notes (Signed)
Patient transported to CT 

## 2018-06-04 NOTE — ED Notes (Signed)
Pt tolerated PO ginger ale

## 2018-06-04 NOTE — ED Provider Notes (Signed)
[facility] Provider Note   CSN: [id number] Arrival date & time: 06/04/18  1401     History   Chief Complaint Chief Complaint  Patient presents with  . Emesis    HPI Destiny Henderson is a 57 y.o. female with history of anemia, benign bladder tumor, DVT, hypertension, obesity presents for evaluation of acute onset, progressively worsening abdominal pain for 5 days.  She notes symptoms began after eating at a [employer] 5 days ago but notes family members who ate with her do not have similar symptoms.  She notes intermittent suprapubic abdominal pain which will radiate to the bilateral flanks and low back.  She notes dysuria and urinary urgency and frequency.  She notes her stools have been softer but not watery.  Denies melena or hematochezia.  Denies hematuria.  Vomiting began Monday and she states she has not been able to keep any food or drink down for the past 3 days or so.  She notes that she has had a fever over the past few days T-max 101 F.  She has tried ibuprofen and Tylenol with some improvement in her symptoms.  Notes she was recently treated for UTI with Macrobid.  Denies recent travel.  The history is provided by the patient.    Past Medical History:  Diagnosis Date  . Anemia   . Benign tumor of bladder   . DVT (deep venous thrombosis) ([place])   . Essential hypertension, benign 03/03/2013  . Fibroma of heart   . Obesity 03/03/2013    Patient Active Problem List   Diagnosis Date Noted  . Low back pain radiating to right leg 02/17/2018  . Pedal edema 02/06/2018  . Overweight 02/06/2018  . History of DVT (deep vein thrombosis) 02/06/2018  . Right wrist injury, subsequent encounter 01/24/2018  . Right knee injury, subsequent encounter 01/24/2018  . Sprain of MCL (medial collateral ligament) of knee 12/12/2016  . Sprain of right ankle 12/12/2016  . Menorrhagia 05/20/2013  . Essential hypertension, benign 03/03/2013  . Obesity  03/03/2013    Past Surgical History:  Procedure Laterality Date  . BACK SURGERY    . bladder tack    . DILATION AND CURETTAGE OF UTERUS     after SAB  . HEART TUMOR EXCISION       OB History    Gravida  4   Para  3   Term  3   Preterm      AB  1   Living  3     SAB  1   TAB      Ectopic      Multiple      Live Births               Home Medications    Prior to Admission medications   Medication Sig Start Date End Date Taking? Authorizing Provider  acetaminophen (TYLENOL) 500 MG tablet Take 1 tablet (500 mg total) by mouth every 6 (six) hours as needed. 01/21/18   Frederica Kuster, PA-C  b complex vitamins capsule Take 1 capsule by mouth daily.    [provider]  ciprofloxacin (CIPRO) 250 MG tablet Take 1 tablet (250 mg total) by mouth every 12 (twelve) hours for 3 days. 06/04/18 06/07/18  Rodell Perna A, PA-C  Coenzyme Q10 (CO Q 10 PO) Take by mouth.    [provider]  diclofenac (VOLTAREN) 75 MG EC tablet Take 1 tablet (75 mg total) by mouth  2 (two) times daily. 02/11/18   Hudnall, Sharyn Lull, MD  ECHINACEA PO Take by mouth.    [provider]  fish oil-omega-3 fatty acids 1000 MG capsule Take 2 g by mouth daily.    [provider]  FLAXSEED, LINSEED, PO Take by mouth.    [provider]  Melatonin 5 MG CAPS Take by mouth.    [provider]  methocarbamol (ROBAXIN) 500 MG tablet Take 1 tablet (500 mg total) by mouth every 8 (eight) hours as needed. 02/13/18   Hudnall, Sharyn Lull, MD  Multiple Vitamins-Minerals (MEGA MULTIVITAMIN FOR WOMEN) TABS Take by mouth.    [provider]  naproxen (NAPROSYN) 375 MG tablet  02/07/18   [provider]  nitrofurantoin, macrocrystal-monohydrate, (MACROBID) 100 MG capsule Take 1 capsule (100 mg total) by mouth 2 (two) times daily. X 7 days 05/11/18   Malvin Johns, MD  ondansetron (ZOFRAN ODT) 4 MG disintegrating tablet Take 1 tablet (4 mg total) by mouth every 8  (eight) hours as needed for up to 3 days for nausea or vomiting. 06/04/18 06/07/18  Rodell Perna A, PA-C  predniSONE (DELTASONE) 20 MG tablet Take 2 tablets (40 mg total) by mouth daily. 02/21/18   Hudnall, Sharyn Lull, MD  spironolactone-hydrochlorothiazide (ALDACTAZIDE) 25-25 MG tablet Take 0.5 tablets by mouth daily. 02/06/18   Revankar, Reita Cliche, MD  VITAMIN A OP Apply to eye.    [provider]  vitamin C (ASCORBIC ACID) 500 MG tablet Take 500 mg by mouth daily.    [provider]  VITAMIN D, CHOLECALCIFEROL, PO Take by mouth.    [provider]    Family History Family History  Problem Relation Age of Onset  . Breast cancer Mother   . Hypertension Mother   . Hyperlipidemia Mother   . Colon cancer Father   . Cancer Father   . Thyroid disease Sister   . Cancer Unknown   . Thyroid disease Sister   . Hypertension Maternal Aunt   . Heart failure Maternal Aunt   . Cancer Maternal Aunt   . Hypertension Maternal Aunt   . Hypertension Maternal Uncle   . Diabetes Maternal Uncle   . Heart failure Maternal Grandmother   . Cancer Maternal Grandfather        prostate    Social History Social History   Tobacco Use  . Smoking status: Never Smoker  . Smokeless tobacco: Never Used  Substance Use Topics  . Alcohol use: No  . Drug use: No     Allergies   Lisinopril; Lasix [furosemide]; Keflex [cephalexin]; Latex; and Sulfa antibiotics   Review of Systems Review of Systems  Constitutional: Positive for chills and fever.  Gastrointestinal: Positive for abdominal pain, diarrhea, nausea and vomiting.  Genitourinary: Positive for dysuria, frequency and urgency. Negative for hematuria, vaginal bleeding, vaginal discharge and vaginal pain.  All other systems reviewed and are negative.    Physical Exam Updated Vital Signs BP (!) 153/90 (BP Location: Left Arm)   Pulse 67   Temp 99.1 F (37.3 C) (Oral)   Resp 16   Ht 5\' 4"  (1.626 m)   Wt 108 kg   SpO2 100%    BMI 40.87 kg/m   Physical Exam  Constitutional: She appears well-developed and well-nourished. No distress.  HENT:  Head: Normocephalic and atraumatic.  Eyes: Conjunctivae are normal. Right eye exhibits no discharge. Left eye exhibits no discharge.  Neck: Normal range of motion. Neck supple. No JVD present. No  tracheal deviation present.  Cardiovascular: Normal rate, regular rhythm and normal heart sounds.  Pulmonary/Chest: Effort normal and breath sounds normal.  Abdominal: Soft. Bowel sounds are normal. She exhibits no distension. There is tenderness in the right lower quadrant and suprapubic area. There is rebound and guarding. There is no rigidity and no CVA tenderness.  Musculoskeletal: She exhibits no edema.  Neurological: She is alert.  Skin: Skin is warm and dry. No erythema.  Psychiatric: She has a normal mood and affect. Her behavior is normal.  Nursing note and vitals reviewed.    ED Treatments / Results  Labs (all labs ordered are listed, but only abnormal results are displayed) Labs Reviewed  COMPREHENSIVE METABOLIC PANEL - Abnormal; Notable for the following components:      Result Value   Glucose, Bld 128 (*)    Creatinine, Ser 1.01 (*)    Calcium 8.8 (*)    All other components within normal limits  CBC WITH DIFFERENTIAL/PLATELET - Abnormal; Notable for the following components:   Hemoglobin 11.8 (*)    HCT 35.7 (*)    All other components within normal limits  URINALYSIS, ROUTINE W REFLEX MICROSCOPIC - Abnormal; Notable for the following components:   APPearance HAZY (*)    Hgb urine dipstick MODERATE (*)    All other components within normal limits  URINALYSIS, MICROSCOPIC (REFLEX) - Abnormal; Notable for the following components:   Bacteria, UA MANY (*)    All other components within normal limits  URINE CULTURE  PREGNANCY, URINE  LIPASE, BLOOD    EKG None  Radiology Ct Abdomen Pelvis W Contrast  Result Date: 06/04/2018 CLINICAL DATA:  Abdominal  pain, nausea, vomiting, and diarrhea for 5 days, question appendicitis EXAM: CT ABDOMEN AND PELVIS WITH CONTRAST TECHNIQUE: Multidetector CT imaging of the abdomen and pelvis was performed using the standard protocol following bolus administration of intravenous contrast. Sagittal and coronal MPR images reconstructed from axial data set. CONTRAST:  43mL ISOVUE-300 IOPAMIDOL (ISOVUE-300) INJECTION 61% PO, 140mL ISOVUE-300 IOPAMIDOL (ISOVUE-300) INJECTION 61% IV COMPARISON:  Eighteen 2018 lung bases clear FINDINGS: Lower chest: Gallbladder and liver normal appearance Hepatobiliary: Normal appearance Pancreas: Normal appearance Spleen: Normal appearance Adrenals/Urinary Tract: Adrenal glands, kidneys, ureters, and bladder normal appearance Stomach/Bowel: Normal appendix. Food debris and contrast in stomach. Stomach and bowel loops otherwise normal appearance. Vascular/Lymphatic: Few pelvic phleboliths. Vascular structures unremarkable. No adenopathy. Reproductive: IUD within uterus. Uterus and adnexa otherwise unremarkable. Other: No free air, free fluid, hernia, or acute inflammatory process. Musculoskeletal: Degenerative disc disease changes L4-L5. Osseous structures otherwise unremarkable. IMPRESSION: No acute intra-abdominal or intrapelvic abnormalities. Electronically Signed   By: Lavonia Dana M.D.   On: 06/04/2018 16:30    Procedures Procedures (including critical care time)  Medications Ordered in ED Medications  sodium chloride 0.9 % bolus 1,000 mL (0 mLs Intravenous Stopped 06/04/18 1637)  ondansetron (ZOFRAN) injection 4 mg (4 mg Intravenous Given 06/04/18 1515)  iopamidol (ISOVUE-300) 61 % injection 30 mL (15 mLs Oral Contrast Given 06/04/18 1554)  iopamidol (ISOVUE-300) 61 % injection 100 mL (100 mLs Intravenous Contrast Given 06/04/18 1555)     Initial Impression / Assessment and Plan / ED Course  I have reviewed the triage vital signs and the nursing notes.  Pertinent labs & imaging results  that were available during my care of the patient were reviewed by me and considered in my medical decision making (see chart for details).     Patient with 5-day history of urinary symptoms and diarrhea with  3-day history of nausea and vomiting.  She is afebrile, vital signs are stable.  She is nontoxic in appearance.  No peritoneal signs on examination of the abdomen but she does have tenderness in the right lower quadrant and suprapubic regions.  Lab work shows mildly elevated creatinine, likely secondary to dehydration from vomiting.  No metabolic derangements.  No leukocytosis, stable H&H.  Lipase and LFTs within normal limits.  UA suggestive of UTI.  She was recently treated for UTI earlier this month with nitrofurantoin.  Urine culture grew E. coli susceptible to nitrofurantoin.  CT of the abdomen pelvis shows no evidence of acute osseous abnormality.  Doubt obstruction, perforation, appendicitis, colitis, diverticulitis, cholecystitis, TOA, ovarian torsion, PID, dissection/AAA, or other acute surgical abdominal pathology.  Patient received IV fluids and Zofran in the ED.  On reevaluation she is resting comfortably no apparent distress.  She states she is feeling better.  She is tolerating p.o. food and fluids in the ED.  Serial abdominal examinations are benign.  She is ambulatory without difficulty.  Will discharge with course of Cipro for UTI.  We will also discharge with Zofran and instructions to advance diet slowly.  Discussed strict ED return precautions.  Recommend follow-up with PCP for reevaluation of symptoms. Pt verbalized understanding of and agreement with plan and is safe for discharge home at this time.   Final Clinical Impressions(s) / ED Diagnoses   Final diagnoses:  Acute cystitis without hematuria  Nausea vomiting and diarrhea    ED Discharge Orders         Ordered    ciprofloxacin (CIPRO) 250 MG tablet  Every 12 hours     06/04/18 1708    ondansetron (ZOFRAN ODT) 4 MG  disintegrating tablet  Every 8 hours PRN     06/04/18 1708           Zacharias Ridling, [place] A, PA-C 06/04/18 [place], Linna Hoff, [place] 06/05/18 [phone]

## 2018-06-06 LAB — URINE CULTURE: Culture: <10000 — AB

## 2018-06-12 ENCOUNTER — Encounter (HOSPITAL_BASED_OUTPATIENT_CLINIC_OR_DEPARTMENT_OTHER): Payer: Self-pay | Admitting: *Deleted

## 2018-06-12 ENCOUNTER — Other Ambulatory Visit: Payer: Self-pay

## 2018-06-12 ENCOUNTER — Emergency Department (HOSPITAL_BASED_OUTPATIENT_CLINIC_OR_DEPARTMENT_OTHER)
Admission: EM | Admit: 2018-06-12 | Discharge: 2018-06-12 | Disposition: A | Discharge status: Home / Self Care | Payer: PRIVATE HEALTH INSURANCE | Attending: Emergency Medicine | Admitting: Emergency Medicine

## 2018-06-12 DIAGNOSIS — Z79899 Other long term (current) drug therapy: Secondary | ICD-10-CM | POA: Insufficient documentation

## 2018-06-12 DIAGNOSIS — R51 Headache: Secondary | ICD-10-CM | POA: Insufficient documentation

## 2018-06-12 DIAGNOSIS — R112 Nausea with vomiting, unspecified: Secondary | ICD-10-CM

## 2018-06-12 DIAGNOSIS — I10 Essential (primary) hypertension: Secondary | ICD-10-CM | POA: Insufficient documentation

## 2018-06-12 DIAGNOSIS — R519 Headache, unspecified: Secondary | ICD-10-CM

## 2018-06-12 LAB — COMPREHENSIVE METABOLIC PANEL
ALBUMIN: 3.9 g/dL (ref 3.5–5.0)
ALT: 17 U/L (ref 0–44)
AST: 22 U/L (ref 15–41)
Alkaline Phosphatase: 43 U/L (ref 38–126)
Anion gap: 7 (ref 5–15)
BUN: 9 mg/dL (ref 6–20)
CHLORIDE: 105 mmol/L (ref 98–111)
CO2: 27 mmol/L (ref 22–32)
Calcium: 8.8 mg/dL — ABNORMAL LOW (ref 8.9–10.3)
Creatinine, Ser: 0.92 mg/dL (ref 0.44–1.00)
GFR calc Af Amer: >60 mL/min (ref >60)
Glucose, Bld: 78 mg/dL (ref 70–99)
POTASSIUM: 4.2 mmol/L (ref 3.5–5.1)
SODIUM: 139 mmol/L (ref 135–145)
Total Bilirubin: 0.5 mg/dL (ref 0.3–1.2)
Total Protein: 7.6 g/dL (ref 6.5–8.1)

## 2018-06-12 LAB — CBC WITH DIFFERENTIAL/PLATELET
BASOS ABS: 0 10*3/uL (ref 0.0–0.1)
Basophils Relative: 0 %
EOS PCT: 7 %
Eosinophils Absolute: 0.4 10*3/uL (ref 0.0–0.7)
HCT: 38.7 % (ref 36.0–46.0)
Hemoglobin: 12.8 g/dL (ref 12.0–15.0)
LYMPHS PCT: 45 %
Lymphs Abs: 2.9 10*3/uL (ref 0.7–4.0)
MCH: 29.5 pg (ref 26.0–34.0)
MCHC: 33.1 g/dL (ref 30.0–36.0)
MCV: 89.2 fL (ref 78.0–100.0)
Monocytes Absolute: 0.6 10*3/uL (ref 0.1–1.0)
Monocytes Relative: 9 %
Neutro Abs: 2.5 10*3/uL (ref 1.7–7.7)
Neutrophils Relative %: 39 %
PLATELETS: 361 10*3/uL (ref 150–400)
RBC: 4.34 MIL/uL (ref 3.87–5.11)
RDW: 14.9 % (ref 11.5–15.5)
WBC: 6.4 10*3/uL (ref 4.0–10.5)

## 2018-06-12 LAB — URINALYSIS, ROUTINE W REFLEX MICROSCOPIC
Bilirubin Urine: NEGATIVE
GLUCOSE, UA: NEGATIVE mg/dL
Ketones, ur: NEGATIVE mg/dL
LEUKOCYTES UA: NEGATIVE
Nitrite: NEGATIVE
PROTEIN: NEGATIVE mg/dL
Specific Gravity, Urine: 1.02 (ref 1.005–1.030)
pH: 6 (ref 5.0–8.0)

## 2018-06-12 LAB — URINALYSIS, MICROSCOPIC (REFLEX): WBC UA: NONE SEEN WBC/hpf (ref 0–5)

## 2018-06-12 LAB — PREGNANCY, URINE: Preg Test, Ur: NEGATIVE

## 2018-06-12 LAB — LIPASE, BLOOD: LIPASE: 25 U/L (ref 11–51)

## 2018-06-12 MED ORDER — METOCLOPRAMIDE HCL 5 MG/ML IJ SOLN
INTRAMUSCULAR | Status: AC
  Administered 2018-06-12: 10 mg via INTRAVENOUS
  Filled 2018-06-12: qty 2

## 2018-06-12 MED ORDER — LORAZEPAM 2 MG/ML IJ SOLN
0.5000 mg | Freq: Once | INTRAMUSCULAR | Status: AC
  Administered 2018-06-12: 0.5 mg via INTRAVENOUS
  Filled 2018-06-12: qty 1

## 2018-06-12 MED ORDER — PROMETHAZINE HCL 25 MG PO TABS: 25.0000 mg | ORAL_TABLET | Freq: Four times a day (QID) | ORAL | 0 refills | Status: DC | PRN

## 2018-06-12 MED ORDER — METOCLOPRAMIDE HCL 5 MG/ML IJ SOLN
10.0000 mg | Freq: Once | INTRAMUSCULAR | Status: AC
  Administered 2018-06-12: 10 mg via INTRAVENOUS
  Filled 2018-06-12: qty 2

## 2018-06-12 MED ORDER — SODIUM CHLORIDE 0.9 % IV BOLUS
1000.0000 mL | Freq: Once | INTRAVENOUS | Status: AC
  Administered 2018-06-12: 1000 mL via INTRAVENOUS

## 2018-06-12 NOTE — ED Triage Notes (Signed)
Diarrhea 5 days ago followed by vomiting. The diarrhea stopped. States she was seen for same a week ago.

## 2018-06-12 NOTE — ED Notes (Signed)
Provided pt. With gingerale and graham crackers

## 2018-06-12 NOTE — Discharge Instructions (Signed)
Get help right away if: You have pain in your chest, neck, arm, or jaw. You feel extremely weak or you faint. You have persistent vomiting. You see blood in your vomit. Your vomit looks like black coffee grounds. You have bloody or black stools or stools that look like tar. You have a severe headache, a stiff neck, or both. You have a rash. You have severe pain, cramping, or bloating in your abdomen. You have trouble breathing or you are breathing very quickly. Your heart is beating very quickly. Your skin feels cold and clammy. You feel confused. You have pain when you urinate. You have signs of dehydration, such as: Dark urine, very little urine, or no urine. Cracked lips. Dry mouth. Sunken eyes. Sleepiness. Weakness.

## 2018-06-12 NOTE — ED Notes (Signed)
ED Provider at bedside discussing test results and dispo plan of care. 

## 2018-06-12 NOTE — ED Provider Notes (Signed)
[facility] EMERGENCY DEPARTMENT Provider Note   CSN: [id number] Arrival date & time: 06/12/18  1237     History   Chief Complaint Emesis and headache  HPI Destiny Henderson is a 57 y.o. female who presents the emergency department chief complaint of emesis.  Was seen in the emergency department on 06/04/2018 with symptoms of fever, nausea, vomiting and diarrhea.  Patient she had an extensive work-up including CT scan of the abdomen and was ultimately discharged with a diagnosis of urinary tract infection.  Patient states that she had about a week where she was feeling fine however this past Saturday she began having extensive diarrhea again with recurrent fever of 102 F.  Beginning Sunday she started having pain with cessation of her diarrhea and has had at least 4-5 episodes of nonbloody nonbilious vomitus daily with severe nausea and inability ability to tolerate any foods or sips of fluid.  Patient states that she is also had several episodes of feeling like the room was spinning but this is only present when she is up and moving about and she does not have it when she is at rest.  She also endorses some lightheadedness with standing.  She states that [place] and is unsure if maybe she has been contaminated.  Her granddaughter also had a GI virus which was similar to her symptoms last week.  Patient also complains of throbbing frontal headache which she describes as "like my migraines."  She denies neck stiffness, rashes.  She denies photophobia, phonophobia or symptoms onset of her symptoms.  She has Zofran at home which she has been using without relief of her nausea or vomiting.  She has no recent foreign travel.  The patient denies any other neurologic abnormality.  Emesis   Associated symptoms include headaches.  Headache   Associated symptoms include vomiting.    Past Medical History:  Diagnosis Date  . Anemia   . Benign tumor of bladder     . DVT (deep venous thrombosis) ([place])   . Essential hypertension, benign 03/03/2013  . Fibroma of heart   . Obesity 03/03/2013    Patient Active Problem List   Diagnosis Date Noted  . Low back pain radiating to right leg 02/17/2018  . Pedal edema 02/06/2018  . Overweight 02/06/2018  . History of DVT (deep vein thrombosis) 02/06/2018  . Right wrist injury, subsequent encounter 01/24/2018  . Right knee injury, subsequent encounter 01/24/2018  . Sprain of MCL (medial collateral ligament) of knee 12/12/2016  . Sprain of right ankle 12/12/2016  . Menorrhagia 05/20/2013  . Essential hypertension, benign 03/03/2013  . Obesity 03/03/2013    Past Surgical History:  Procedure Laterality Date  . BACK SURGERY    . bladder tack    . DILATION AND CURETTAGE OF UTERUS     after SAB  . HEART TUMOR EXCISION       OB History    Gravida  4   Para  3   Term  3   Preterm      AB  1   Living  3     SAB  1   TAB      Ectopic      Multiple      Live Births               Home Medications    Prior to Admission medications   Medication Sig Start Date End Date Taking?  Authorizing Provider  acetaminophen (TYLENOL) 500 MG tablet Take 1 tablet (500 mg total) by mouth every 6 (six) hours as needed. 01/21/18   Frederica Kuster, PA-C  b complex vitamins capsule Take 1 capsule by mouth daily.    [provider]  Coenzyme Q10 (CO Q 10 PO) Take by mouth.    [provider]  diclofenac (VOLTAREN) 75 MG EC tablet Take 1 tablet (75 mg total) by mouth 2 (two) times daily. 02/11/18   Hudnall, Sharyn Lull, MD  ECHINACEA PO Take by mouth.    [provider]  fish oil-omega-3 fatty acids 1000 MG capsule Take 2 g by mouth daily.    [provider]  FLAXSEED, LINSEED, PO Take by mouth.    [provider]  Melatonin 5 MG CAPS Take by mouth.    [provider]  methocarbamol (ROBAXIN) 500 MG tablet Take 1 tablet (500 mg total) by mouth every 8  (eight) hours as needed. 02/13/18   Hudnall, Sharyn Lull, MD  Multiple Vitamins-Minerals (MEGA MULTIVITAMIN FOR WOMEN) TABS Take by mouth.    [provider]  naproxen (NAPROSYN) 375 MG tablet  02/07/18   [provider]  nitrofurantoin, macrocrystal-monohydrate, (MACROBID) 100 MG capsule Take 1 capsule (100 mg total) by mouth 2 (two) times daily. X 7 days 05/11/18   Malvin Johns, MD  predniSONE (DELTASONE) 20 MG tablet Take 2 tablets (40 mg total) by mouth daily. 02/21/18   Hudnall, Sharyn Lull, MD  promethazine (PHENERGAN) 25 MG tablet Take 1 tablet (25 mg total) by mouth every 6 (six) hours as needed for nausea or vomiting. 06/12/18   Margarita Mail, PA-C  spironolactone-hydrochlorothiazide (ALDACTAZIDE) 25-25 MG tablet Take 0.5 tablets by mouth daily. 02/06/18   Revankar, Reita Cliche, MD  VITAMIN A OP Apply to eye.    [provider]  vitamin C (ASCORBIC ACID) 500 MG tablet Take 500 mg by mouth daily.    [provider]  VITAMIN D, CHOLECALCIFEROL, PO Take by mouth.    [provider]    Family History Family History  Problem Relation Age of Onset  . Breast cancer Mother   . Hypertension Mother   . Hyperlipidemia Mother   . Colon cancer Father   . Cancer Father   . Thyroid disease Sister   . Cancer Unknown   . Thyroid disease Sister   . Hypertension Maternal Aunt   . Heart failure Maternal Aunt   . Cancer Maternal Aunt   . Hypertension Maternal Aunt   . Hypertension Maternal Uncle   . Diabetes Maternal Uncle   . Heart failure Maternal Grandmother   . Cancer Maternal Grandfather        prostate    Social History Social History   Tobacco Use  . Smoking status: Never Smoker  . Smokeless tobacco: Never Used  Substance Use Topics  . Alcohol use: No  . Drug use: No     Allergies   Lisinopril; Lasix [furosemide]; Keflex [cephalexin]; Latex; and Sulfa antibiotics   Review of Systems Review of Systems  Gastrointestinal: Positive for vomiting.    Neurological: Positive for headaches.    Ten systems reviewed and are negative for acute change, except as noted in the HPI.   Physical Exam Updated Vital Signs BP 134/76 (BP Location: Left Arm)   Pulse 64   Temp 98.6 F (37 C) (Oral)   Resp 16   Ht 5\' 4"  (1.626 m)   Wt 105.2 kg   SpO2  96%   BMI 39.81 kg/m   Physical Exam  Constitutional: She is oriented to person, place, and time. She appears well-developed and well-nourished. No distress.  HENT:  Head: Normocephalic and atraumatic.  Dry oral mucosa  Eyes: Pupils are equal, round, and reactive to light. Conjunctivae are normal. No scleral icterus.  Neck: Normal range of motion.  Cardiovascular: Normal rate, regular rhythm and normal heart sounds. Exam reveals no gallop and no friction rub.  No murmur heard. Pulmonary/Chest: Effort normal and breath sounds normal. No respiratory distress.  Abdominal: Soft. Bowel sounds are normal. She exhibits no distension and no mass. There is no tenderness. There is no guarding.  Neurological: She is alert and oriented to person, place, and time.  Skin: Skin is warm and dry. She is not diaphoretic.  Psychiatric: Her behavior is normal.  Nursing note and vitals reviewed.    ED Treatments / Results  Labs (all labs ordered are listed, but only abnormal results are displayed) Labs Reviewed  COMPREHENSIVE METABOLIC PANEL - Abnormal; Notable for the following components:      Result Value   Calcium 8.8 (*)    All other components within normal limits  URINALYSIS, ROUTINE W REFLEX MICROSCOPIC - Abnormal; Notable for the following components:   Hgb urine dipstick SMALL (*)    All other components within normal limits  URINALYSIS, MICROSCOPIC (REFLEX) - Abnormal; Notable for the following components:   Bacteria, UA MANY (*)    All other components within normal limits  CBC WITH DIFFERENTIAL/PLATELET  LIPASE, BLOOD  PREGNANCY, URINE    EKG None  Radiology No results  found.  Procedures Procedures (including critical care time)  Medications Ordered in ED Medications  sodium chloride 0.9 % bolus 1,000 mL ( Intravenous Stopped 06/12/18 1610)  LORazepam (ATIVAN) injection 0.5 mg (0.5 mg Intravenous Given 06/12/18 1502)  metoCLOPramide (REGLAN) injection 10 mg (10 mg Intravenous Given 06/12/18 1504)     Initial Impression / Assessment and Plan / ED Course  I have reviewed the triage vital signs and the nursing notes.  Pertinent labs & imaging results that were available during my care of the patient were reviewed by me and considered in my medical decision making (see chart for details).    Patient with emesis. The emergent differential diagnosis for vomiting includes, but is not limited to ACS/MI, Boerhaave's, DKA, Intracranial Hemorrhage, Ischemic bowel, Meningitis, Sepsis, Acute radiation syndrome, Acute gastric dilation, Acetaminophen toxicity, Adrenal insufficiency, Appendicitis, Aspirin toxicity, Bowel obstruction/ileus, Carbon monoxide poisoning, Cholecystitis, CNS tumor. Digoxin toxicity, Electrolyte abnormalities, Elevated ICP, Gastric outlet obstruction, Hyperemesis gravidarum, Pancreatitis, Peritonitis, Ruptured viscus, Testicular torsion/ovarian torsion, Theophyline toxicity, Biliary colic, Cannabinoid hyperemesis syndrome, Chemotherapy, Disulfiram effect, Erythromycin, ETOH, Gastritis, Gastroenteritis, Gastroparesis, Hepatitis, Ibuprofen, Ipecac toxicity, Labyrinthitis, Migraine, Motion sickness, Narcotic withdrawal, Thyroid, Pregnancy, Peptic ulcer disease, Renal colic, and UTI I reviewed the patient's lab results which show no significant abnormality.  Difficult to say if the patient is having a migraine headache causing vomiting versus if she had a some kind of a viral gastroenteritis which her granddaughter also had a cause dehydration and then bad headache.  Ultimately she is tolerating p.o. fluids and feeling much better.  I doubt any emergent cause  of her vomiting or headache. Pt HA treated and improved while in ED.  Presentation not concerning for [facility], ICH, Meningitis, or temporal arteritis. Pt is afebrile with no focal neuro deficits, nuchal rigidity, or change in vision. Pt is to follow up with PCP to discuss prophylactic medication. Pt  verbalizes understanding and is agreeable with plan to dc.    Final Clinical Impressions(s) / ED Diagnoses   Final diagnoses:  Non-intractable vomiting with nausea, unspecified vomiting type  Bad headache    ED Discharge Orders         Ordered    promethazine (PHENERGAN) 25 MG tablet  Every 6 hours PRN     06/12/18 1611           Margarita Mail, PA-C 06/13/18 0038    Margette Fast, MD 06/13/18 1141

## 2018-06-19 ENCOUNTER — Ambulatory Visit: Payer: PRIVATE HEALTH INSURANCE | Admitting: Family Medicine

## 2018-06-19 DIAGNOSIS — Z30432 Encounter for removal of intrauterine contraceptive device: Secondary | ICD-10-CM

## 2018-06-21 ENCOUNTER — Emergency Department (HOSPITAL_BASED_OUTPATIENT_CLINIC_OR_DEPARTMENT_OTHER): Payer: Self-pay

## 2018-06-21 ENCOUNTER — Other Ambulatory Visit: Payer: Self-pay

## 2018-06-21 ENCOUNTER — Encounter (HOSPITAL_BASED_OUTPATIENT_CLINIC_OR_DEPARTMENT_OTHER): Payer: Self-pay

## 2018-06-21 ENCOUNTER — Emergency Department (HOSPITAL_BASED_OUTPATIENT_CLINIC_OR_DEPARTMENT_OTHER)
Admission: EM | Admit: 2018-06-21 | Discharge: 2018-06-21 | Disposition: A | Discharge status: Home / Self Care | Payer: Self-pay | Attending: Emergency Medicine | Admitting: Emergency Medicine

## 2018-06-21 DIAGNOSIS — S39012A Strain of muscle, fascia and tendon of lower back, initial encounter: Secondary | ICD-10-CM | POA: Insufficient documentation

## 2018-06-21 DIAGNOSIS — Y939 Activity, unspecified: Secondary | ICD-10-CM | POA: Insufficient documentation

## 2018-06-21 DIAGNOSIS — W19XXXA Unspecified fall, initial encounter: Secondary | ICD-10-CM

## 2018-06-21 DIAGNOSIS — I10 Essential (primary) hypertension: Secondary | ICD-10-CM | POA: Insufficient documentation

## 2018-06-21 DIAGNOSIS — W108XXA Fall (on) (from) other stairs and steps, initial encounter: Secondary | ICD-10-CM | POA: Insufficient documentation

## 2018-06-21 DIAGNOSIS — Y999 Unspecified external cause status: Secondary | ICD-10-CM | POA: Insufficient documentation

## 2018-06-21 DIAGNOSIS — Z79899 Other long term (current) drug therapy: Secondary | ICD-10-CM | POA: Insufficient documentation

## 2018-06-21 DIAGNOSIS — Z91040 Latex allergy status: Secondary | ICD-10-CM | POA: Insufficient documentation

## 2018-06-21 DIAGNOSIS — Z86718 Personal history of other venous thrombosis and embolism: Secondary | ICD-10-CM | POA: Insufficient documentation

## 2018-06-21 DIAGNOSIS — Y929 Unspecified place or not applicable: Secondary | ICD-10-CM | POA: Insufficient documentation

## 2018-06-21 MED ORDER — NAPROXEN 500 MG PO TABS: 500.0000 mg | ORAL_TABLET | Freq: Two times a day (BID) | ORAL | 1 refills | Status: DC

## 2018-06-21 MED ORDER — CYCLOBENZAPRINE HCL 10 MG PO TABS: 10.0000 mg | ORAL_TABLET | Freq: Three times a day (TID) | ORAL | 0 refills | Status: DC

## 2018-06-21 NOTE — ED Notes (Signed)
ED Provider at bedside. 
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
## 2018-06-21 NOTE — ED Triage Notes (Signed)
Pt reports fall down steps this morning. Reports back pain. Pt refusing any strong pain medicine during triage, sts "I am holistic." Pt denies any LOC and head trauma. Pt ambulatory to room.

## 2018-06-21 NOTE — ED Notes (Signed)
Patient transported to X-ray 

## 2018-06-21 NOTE — Discharge Instructions (Addendum)
X-rays without evidence of any bony injury.  Take the Flexeril at night as it will make you sleepy.  Take the Naprosyn on a regular basis.  Work note provided.  Make an appointment to follow-up with sports medicine clinic upstairs.  Return for any new or worse symptoms.  Work note provided.

## 2018-06-21 NOTE — ED Notes (Signed)
ED Provider at bedside discussing results with patient at this time.

## 2018-06-21 NOTE — ED Notes (Signed)
Pt transported to XR via stretcher.

## 2018-06-21 NOTE — ED Notes (Signed)
Pt. returned from XR. 

## 2018-06-25 ENCOUNTER — Encounter: Payer: Self-pay | Admitting: Family Medicine

## 2018-06-25 ENCOUNTER — Encounter

## 2018-06-25 ENCOUNTER — Ambulatory Visit (INDEPENDENT_AMBULATORY_CARE_PROVIDER_SITE_OTHER): Payer: Self-pay | Admitting: Family Medicine

## 2018-06-25 VITALS — BP 164/82 | HR 90 | Ht 64.0 in | Wt 231.0 lb

## 2018-06-25 DIAGNOSIS — S39012A Strain of muscle, fascia and tendon of lower back, initial encounter: Secondary | ICD-10-CM

## 2018-06-25 NOTE — Patient Instructions (Signed)
You have a lumbar strain. Ok to take tylenol for baseline pain relief (1-2 extra strength tabs 3x/day) Naproxen twice a day with food for pain and inflammation. Topical salon pas helps with pain. aspercreme topically up to 4 times a day will help as well. Stay as active as possible. Do home exercises and stretches as directed - hold each for 20-30 seconds and do each one three times. Consider massage, chiropractor, physical therapy, and/or acupuncture. Physical therapy has been shown to be helpful while the others have mixed results - start this next week at the earliest. Strengthening of low back muscles, abdominal musculature are key for long term pain relief. Follow up with me in 1 week.

## 2018-06-29 ENCOUNTER — Encounter: Payer: Self-pay | Admitting: Family Medicine

## 2018-06-29 NOTE — Progress Notes (Signed)
PCP: Truett Mainland, DO  Subjective:   HPI: Patient is a 57 y.o. female here for low back injury.  Patient reports she was on her way to work on 9/14 when she fell down 13 steps. Injury caused her to fall directly downward and then bump down one step at a time. Immediate pain. Occurred when railing came out of the wall. Pain in low back and radiates laterally. No radiation into extremities though. No numbness/tingling. No bowel/bladder dysfunction. Pain level 9/10, sharp. Taking aleve, advil.  Not taking flexeril.  Past Medical History:  Diagnosis Date  . Anemia   . Benign tumor of bladder   . DVT (deep venous thrombosis) ([place])   . Essential hypertension, benign 03/03/2013  . Fibroma of heart   . Obesity 03/03/2013    Current Outpatient Medications on File Prior to Visit  Medication Sig Dispense Refill  . acetaminophen (TYLENOL) 500 MG tablet Take 1 tablet (500 mg total) by mouth every 6 (six) hours as needed. 30 tablet 0  . b complex vitamins capsule Take 1 capsule by mouth daily.    . Coenzyme Q10 (CO Q 10 PO) Take by mouth.    . ECHINACEA PO Take by mouth.    . fish oil-omega-3 fatty acids 1000 MG capsule Take 2 g by mouth daily.    Marland Kitchen FLAXSEED, LINSEED, PO Take by mouth.    . Melatonin 5 MG CAPS Take by mouth.    . Multiple Vitamins-Minerals (MEGA MULTIVITAMIN FOR WOMEN) TABS Take by mouth.    . naproxen (NAPROSYN) 500 MG tablet Take 1 tablet (500 mg total) by mouth 2 (two) times daily. 14 tablet 1  . promethazine (PHENERGAN) 25 MG tablet Take 1 tablet (25 mg total) by mouth every 6 (six) hours as needed for nausea or vomiting. 12 tablet 0  . spironolactone-hydrochlorothiazide (ALDACTAZIDE) 25-25 MG tablet Take 0.5 tablets by mouth daily. 30 tablet 0  . VITAMIN A OP Apply to eye.    . vitamin C (ASCORBIC ACID) 500 MG tablet Take 500 mg by mouth daily.    Marland Kitchen VITAMIN D, CHOLECALCIFEROL, PO Take by mouth.     No current facility-administered medications on file prior to  visit.     Past Surgical History:  Procedure Laterality Date  . BACK SURGERY    . bladder tack    . DILATION AND CURETTAGE OF UTERUS     after SAB  . HEART TUMOR EXCISION      Allergies  Allergen Reactions  . Lisinopril Swelling    Angioedema   . Lasix [Furosemide] Rash and Other (See Comments)    Shortness of breath  . Nitrofurantoin Rash  . Keflex [Cephalexin] Rash  . Latex Rash  . Sulfa Antibiotics Rash    Social History   Socioeconomic History  . Marital status: Married    Spouse name: Not on file  . Number of children: Not on file  . Years of education: Not on file  . Highest education level: Not on file  Occupational History  . Occupation: Network engineer  Social Needs  . Financial resource strain: Not on file  . Food insecurity:    Worry: Not on file    Inability: Not on file  . Transportation needs:    Medical: Not on file    Non-medical: Not on file  Tobacco Use  . Smoking status: Never Smoker  . Smokeless tobacco: Never Used  Substance and Sexual Activity  . Alcohol use: No  . Drug use:  No  . Sexual activity: Not on file  Lifestyle  . Physical activity:    Days per week: Not on file    Minutes per session: Not on file  . Stress: Not on file  Relationships  . Social connections:    Talks on phone: Not on file    Gets together: Not on file    Attends religious service: Not on file    Active member of club or organization: Not on file    Attends meetings of clubs or organizations: Not on file    Relationship status: Not on file  . Intimate partner violence:    Fear of current or ex partner: Not on file    Emotionally abused: Not on file    Physically abused: Not on file    Forced sexual activity: Not on file  Other Topics Concern  . Not on file  Social History Narrative  . Not on file    Family History  Problem Relation Age of Onset  . Breast cancer Mother   . Hypertension Mother   . Hyperlipidemia Mother   . Colon cancer Father   .  Cancer Father   . Thyroid disease Sister   . Cancer Unknown   . Thyroid disease Sister   . Hypertension Maternal Aunt   . Heart failure Maternal Aunt   . Cancer Maternal Aunt   . Hypertension Maternal Aunt   . Hypertension Maternal Uncle   . Diabetes Maternal Uncle   . Heart failure Maternal Grandmother   . Cancer Maternal Grandfather        prostate    BP (!) 164/82   Pulse 90   Ht 5\' 4"  (1.626 m)   Wt 231 lb (104.8 kg)   BMI 39.65 kg/m   Review of Systems: See HPI above.     Objective:  Physical Exam:  Gen: NAD, comfortable in exam room  Back: No gross deformity, scoliosis. TTP bilateral paraspinal lumbar regions.  No palpable stepoffs.  No bony TTP. Limited flexion and extension. Strength LEs 5/5 all muscle groups.   1+ MSRs in patellar and achilles tendons, equal bilaterally. Negative SLRs. Sensation intact to light touch bilaterally.  Bilateral hips; No deformity. FROM with 5/5 strength. No tenderness to palpation. NVI distally. Negative logroll bilateral hips   Assessment & Plan:  1. Low back pain - consistent with lumbar strain, contusion.  No red flag symptoms or signs.  Tylenol, naproxen, salon pas, aspercreme.  Shown home exercises - start PT next week.  F/u in 1 week for reevaluation.

## 2018-07-03 ENCOUNTER — Encounter: Payer: Self-pay | Admitting: Family Medicine

## 2018-07-03 ENCOUNTER — Ambulatory Visit (INDEPENDENT_AMBULATORY_CARE_PROVIDER_SITE_OTHER): Payer: Self-pay | Admitting: Family Medicine

## 2018-07-03 VITALS — BP 135/89 | HR 87 | Ht 64.0 in | Wt 232.0 lb

## 2018-07-03 DIAGNOSIS — S39012D Strain of muscle, fascia and tendon of lower back, subsequent encounter: Secondary | ICD-10-CM

## 2018-07-03 NOTE — Progress Notes (Signed)
PCP: Truett Mainland, DO  Subjective:   HPI: Patient is a 57 y.o. female here for low back injury.  9/18: Patient reports she was on her way to work on 9/14 when she fell down 13 steps. Injury caused her to fall directly downward and then bump down one step at a time. Immediate pain. Occurred when railing came out of the wall. Pain in low back and radiates laterally. No radiation into extremities though. No numbness/tingling. No bowel/bladder dysfunction. Pain level 9/10, sharp. Taking aleve, advil.  Not taking flexeril.  9/26: Patient returns for follow-up for lumbar strain.  Today she reports 0/10 pain.  She only notes feeling of stiffness in her low back.  She has been doing home exercises and taking Aleve as needed which have helped.  She does not need muscle relaxers any longer.  She is improving in her ability to bend forward and can now reach about to her knees.  She has no issues with extension.  She denies any feeling of weakness in her lower extremities.  No numbness or tingling.  Overall she feels that she is ready to return to work.  Past Medical History:  Diagnosis Date  . Anemia   . Benign tumor of bladder   . DVT (deep venous thrombosis) ([place])   . Essential hypertension, benign 03/03/2013  . Fibroma of heart   . Obesity 03/03/2013    Current Outpatient Medications on File Prior to Visit  Medication Sig Dispense Refill  . acetaminophen (TYLENOL) 500 MG tablet Take 1 tablet (500 mg total) by mouth every 6 (six) hours as needed. 30 tablet 0  . b complex vitamins capsule Take 1 capsule by mouth daily.    . Coenzyme Q10 (CO Q 10 PO) Take by mouth.    . ECHINACEA PO Take by mouth.    . fish oil-omega-3 fatty acids 1000 MG capsule Take 2 g by mouth daily.    Marland Kitchen FLAXSEED, LINSEED, PO Take by mouth.    . Melatonin 5 MG CAPS Take by mouth.    . Multiple Vitamins-Minerals (MEGA MULTIVITAMIN FOR WOMEN) TABS Take by mouth.    . naproxen (NAPROSYN) 500 MG tablet Take 1 tablet  (500 mg total) by mouth 2 (two) times daily. 14 tablet 1  . promethazine (PHENERGAN) 25 MG tablet Take 1 tablet (25 mg total) by mouth every 6 (six) hours as needed for nausea or vomiting. 12 tablet 0  . spironolactone-hydrochlorothiazide (ALDACTAZIDE) 25-25 MG tablet Take 0.5 tablets by mouth daily. 30 tablet 0  . VITAMIN A OP Apply to eye.    . vitamin C (ASCORBIC ACID) 500 MG tablet Take 500 mg by mouth daily.    Marland Kitchen VITAMIN D, CHOLECALCIFEROL, PO Take by mouth.     No current facility-administered medications on file prior to visit.     Past Surgical History:  Procedure Laterality Date  . BACK SURGERY    . bladder tack    . DILATION AND CURETTAGE OF UTERUS     after SAB  . HEART TUMOR EXCISION      Allergies  Allergen Reactions  . Lisinopril Swelling    Angioedema   . Lasix [Furosemide] Rash and Other (See Comments)    Shortness of breath  . Nitrofurantoin Rash  . Keflex [Cephalexin] Rash  . Latex Rash  . Sulfa Antibiotics Rash    Social History   Socioeconomic History  . Marital status: Married    Spouse name: Not on file  . Number  of children: Not on file  . Years of education: Not on file  . Highest education level: Not on file  Occupational History  . Occupation: Network engineer  Social Needs  . Financial resource strain: Not on file  . Food insecurity:    Worry: Not on file    Inability: Not on file  . Transportation needs:    Medical: Not on file    Non-medical: Not on file  Tobacco Use  . Smoking status: Never Smoker  . Smokeless tobacco: Never Used  Substance and Sexual Activity  . Alcohol use: No  . Drug use: No  . Sexual activity: Not on file  Lifestyle  . Physical activity:    Days per week: Not on file    Minutes per session: Not on file  . Stress: Not on file  Relationships  . Social connections:    Talks on phone: Not on file    Gets together: Not on file    Attends religious service: Not on file    Active member of club or organization: Not  on file    Attends meetings of clubs or organizations: Not on file    Relationship status: Not on file  . Intimate partner violence:    Fear of current or ex partner: Not on file    Emotionally abused: Not on file    Physically abused: Not on file    Forced sexual activity: Not on file  Other Topics Concern  . Not on file  Social History Narrative  . Not on file    Family History  Problem Relation Age of Onset  . Breast cancer Mother   . Hypertension Mother   . Hyperlipidemia Mother   . Colon cancer Father   . Cancer Father   . Thyroid disease Sister   . Cancer Unknown   . Thyroid disease Sister   . Hypertension Maternal Aunt   . Heart failure Maternal Aunt   . Cancer Maternal Aunt   . Hypertension Maternal Aunt   . Hypertension Maternal Uncle   . Diabetes Maternal Uncle   . Heart failure Maternal Grandmother   . Cancer Maternal Grandfather        prostate    There were no vitals taken for this visit.  Review of Systems: See HPI above.     Objective:  Physical Exam:  GEN: awake, alert, standing as this is more comfortable than sitting  Lumbar: No deformity TTP paraspinal lumbar muscles b/l. No bony TTP Improved flexion to about 70degrees. Normal extension 5/5 strength in LE 1+ DTRs b/l, sensation intact b/l Neg SLR  Bilateral hips: No pain with passive IR/ER No pain with resisted hip flexion   Assessment & Plan:  1. Low back pain - no bony injury on previous xrays. No neurologic symptoms. - continue advil as needed - will refer to PT for additional therapy - pt to return to work with lifting/standing restrictions - f/u 1 month

## 2018-07-03 NOTE — Patient Instructions (Signed)
You have a lumbar strain. Tylenol, naproxen only if needed. Can use salon pas, aspercreme as needed also. Do home exercises and stretches as directed - hold each for 20-30 seconds and do each one three times. Consider massage, chiropractor, physical therapy, and/or acupuncture. Start physical therapy. Strengthening of low back muscles, abdominal musculature are key for long term pain relief. Follow up with me in 1 month.

## 2018-07-08 ENCOUNTER — Telehealth: Payer: Self-pay

## 2018-07-08 NOTE — Telephone Encounter (Signed)
New message    Just an FYI. We have made several attempts to contact this patient  6/17, 6/19, 7/8, & 9/23 including sending a letter to schedule or reschedule their myocardial perfusion imaging . We will be removing the patient from the WQ.   Thank you

## 2018-08-04 ENCOUNTER — Ambulatory Visit: Payer: Self-pay | Admitting: Family Medicine

## 2018-08-07 ENCOUNTER — Ambulatory Visit: Payer: Self-pay | Admitting: Family Medicine

## 2018-09-22 ENCOUNTER — Telehealth: Payer: Self-pay

## 2018-09-22 NOTE — Telephone Encounter (Signed)
Patient called and originally spoke with front desk.  Patient transferred to me with questions of wanting to schedule IUD removal. I offered her first available. Patient then ask to be schedule for UTI check. Patient unable to come before 5 pm.   Patient states she wants appointment another day this week. Made her aware that we do not have any openings for IUD removal this week. She desires to have IUD removed because she is 57 years old and thinks it is causing her weight gain.     Patient states that we are going to let her go with a UTI for a week.  Patient then states  "I am going to record you." I told the patient we take our last patient appointment at 4:30pm. She then asks for what time she could come Tuesday or Wednesday. I  offered patient our after hours clinic at [facility] today at [place] or she could go to urgent care since she does not have a PCP. Patient accepts going to after hours clinic today. She wanted to call and make and appointment and I let her know this is a walk in clinic. Patient asked if  it an urgent care cause my insurance wont cover urgent care. I assured her this is an extension of our Center for [employer] and it is our clinic that has extended hours.  Patient then states she wants appointment for IUD removal with Dr. Nehemiah Settle. I let her know he wont have first available appointment with him until Jan 16th. Patient doesn't want this. Schedule with Dr. Hulan Fray first available on Jan 2 for IUD removal.   *Patient switched concerns multiple times in phone call and was very agitated.  Kathrene Alu RN  _______________  - After phone call spoke with my manager Mellody Dance to make her aware of patient phone call. Patient has no showed multiple times to our clinic and has bad debt balance.  Made note that patient called with similar complaints on 04/15/18 and  05/14/18. Patient no showed appt with our clinic on 06-19-18.  Kathrene Alu RN

## 2018-10-09 ENCOUNTER — Ambulatory Visit: Payer: Self-pay | Admitting: Obstetrics & Gynecology

## 2018-10-09 ENCOUNTER — Encounter: Payer: Self-pay | Admitting: Obstetrics & Gynecology

## 2018-10-09 VITALS — BP 140/82 | HR 73 | Ht 64.0 in | Wt 229.0 lb

## 2018-10-09 DIAGNOSIS — N9489 Other specified conditions associated with female genital organs and menstrual cycle: Secondary | ICD-10-CM

## 2018-10-09 DIAGNOSIS — A749 Chlamydial infection, unspecified: Secondary | ICD-10-CM

## 2018-10-09 DIAGNOSIS — N958 Other specified menopausal and perimenopausal disorders: Secondary | ICD-10-CM

## 2018-10-09 DIAGNOSIS — F439 Reaction to severe stress, unspecified: Secondary | ICD-10-CM

## 2018-10-09 MED ORDER — AZITHROMYCIN 1 G PO PACK: 1.0000 g | PACK | Freq: Once | ORAL | 0 refills | Status: AC

## 2018-10-09 MED FILL — AZITHROMYCIN 500 MG TABLET: 500 | 1 days supply | Qty: 2 | Fill #0

## 2018-10-09 NOTE — Progress Notes (Signed)
Patient requests IUD removal. Patient states that her husband recently tested positive chlamydia  Kathrene Alu RN

## 2018-10-09 NOTE — Progress Notes (Signed)
   Subjective:    Patient ID: Destiny Henderson, female    DOB: [birth year], 58 y.o.   MRN: [id number]  HPI  58 yo married lady here for treatment of + CT. She was just notified by another provider. Her husband of 21 years cheated and was + for CT.   She has a 58 yo Liletta in for management of menorrhagia. She would like it removed if she is past menopause.  Review of Systems Pap normal 5/19    Objective:   Physical Exam Breathing, conversing, and ambulating normally Well nourished, well hydrated Black female, no apparent distress Crying with stress    Assessment & Plan:  + CT- treat with zithromax 1 gram po once.  She will not be having sex with her husband in the near future (or ever) She is willing to see Roselyn Reef  Check [facility] today If it is elevated, come back Monday for IUD removal

## 2018-10-13 ENCOUNTER — Ambulatory Visit: Payer: Self-pay | Admitting: Obstetrics & Gynecology

## 2018-10-13 DIAGNOSIS — Z30432 Encounter for removal of intrauterine contraceptive device: Secondary | ICD-10-CM

## 2018-10-15 ENCOUNTER — Emergency Department (HOSPITAL_BASED_OUTPATIENT_CLINIC_OR_DEPARTMENT_OTHER)
Admission: EM | Admit: 2018-10-15 | Discharge: 2018-10-15 | Disposition: A | Discharge status: Home / Self Care | Payer: Self-pay | Attending: Emergency Medicine | Admitting: Emergency Medicine

## 2018-10-15 ENCOUNTER — Emergency Department (HOSPITAL_BASED_OUTPATIENT_CLINIC_OR_DEPARTMENT_OTHER): Payer: Self-pay

## 2018-10-15 ENCOUNTER — Encounter (HOSPITAL_BASED_OUTPATIENT_CLINIC_OR_DEPARTMENT_OTHER): Payer: Self-pay | Admitting: *Deleted

## 2018-10-15 DIAGNOSIS — Z79899 Other long term (current) drug therapy: Secondary | ICD-10-CM | POA: Insufficient documentation

## 2018-10-15 DIAGNOSIS — Z20828 Contact with and (suspected) exposure to other viral communicable diseases: Secondary | ICD-10-CM | POA: Insufficient documentation

## 2018-10-15 DIAGNOSIS — Z91040 Latex allergy status: Secondary | ICD-10-CM | POA: Insufficient documentation

## 2018-10-15 DIAGNOSIS — I10 Essential (primary) hypertension: Secondary | ICD-10-CM | POA: Insufficient documentation

## 2018-10-15 DIAGNOSIS — J111 Influenza due to unidentified influenza virus with other respiratory manifestations: Secondary | ICD-10-CM | POA: Insufficient documentation

## 2018-10-15 LAB — GROUP A STREP BY PCR: GROUP A STREP BY PCR: NOT DETECTED

## 2018-10-15 MED ORDER — ACETAMINOPHEN 325 MG PO TABS
650.0000 mg | ORAL_TABLET | Freq: Once | ORAL | Status: AC
  Administered 2018-10-15: 650 mg via ORAL
  Filled 2018-10-15: qty 2

## 2018-10-15 MED ORDER — OSELTAMIVIR PHOSPHATE 75 MG PO CAPS: 75.0000 mg | ORAL_CAPSULE | Freq: Two times a day (BID) | ORAL | 0 refills | Status: AC

## 2018-10-15 NOTE — Discharge Instructions (Addendum)
You likely have the flu. I have sent tamiflu to your pharmacy, please take twice per day for 10 days. Please be sure to stay hydrated as discussed.   Of course, if you start having trouble breathing, worsening fevers, vomiting and unable to hold down any fluids, or you have other concerns, don't hesitate to come back or go to the ED after hours.

## 2018-10-15 NOTE — ED Notes (Signed)
ED Provider at bedside. 

## 2018-10-15 NOTE — ED Triage Notes (Signed)
Pt c/o flu like symptoms x 4 days, family at home with FLu , nausea only

## 2018-10-15 NOTE — ED Provider Notes (Signed)
[facility] EMERGENCY DEPARTMENT Provider Note   CSN: [id number] Arrival date & time: 10/15/18  1501   History   Chief Complaint Chief Complaint  Patient presents with  . Generalized Body Aches    HPI Destiny Henderson is a 58 y.o. female.   Patient reports that 3 days ago she was having some increased nausea with intermittent vomiting.  This improved slightly.  Then suddenly last night she felt feverish with body aches and headache.  Took her temperature and it was 102.  Patient reports that her joints are hurting all over.  She reports that she had the flu shot this year however her sister has been sick with the flu for about 2 weeks now.  She has been trying to prevent getting it by washing her hands, wearing a mask and drinking emergency.  Patient denies any chest pain or shortness of breath.  Reports that she is otherwise healthy and is not on any medications at home.  She has only tried ibuprofen in order to decrease her fever.  She also endorses that her throat started hurting last night as well.  She has had trouble swallowing.  The history is provided by the patient.    Past Medical History:  Diagnosis Date  . Anemia   . Benign tumor of bladder   . DVT (deep venous thrombosis) ([place])   . Essential hypertension, benign 03/03/2013  . Fibroma of heart   . Obesity 03/03/2013    Patient Active Problem List   Diagnosis Date Noted  . Low back pain radiating to right leg 02/17/2018  . Pedal edema 02/06/2018  . History of DVT (deep vein thrombosis) 02/06/2018  . Right wrist injury, subsequent encounter 01/24/2018  . Right knee injury, subsequent encounter 01/24/2018  . Sprain of MCL (medial collateral ligament) of knee 12/12/2016  . Sprain of right ankle 12/12/2016  . Menorrhagia 05/20/2013  . Essential hypertension, benign 03/03/2013  . Obesity 03/03/2013    Past Surgical History:  Procedure Laterality Date  . BACK SURGERY    . bladder tack    . DILATION AND  CURETTAGE OF UTERUS     after SAB  . HEART TUMOR EXCISION       OB History    Gravida  4   Para  3   Term  3   Preterm      AB  1   Living  3     SAB  1   TAB      Ectopic      Multiple      Live Births               Home Medications    Prior to Admission medications   Medication Sig Start Date End Date Taking? Authorizing Provider  acetaminophen (TYLENOL) 500 MG tablet Take 1 tablet (500 mg total) by mouth every 6 (six) hours as needed. 01/21/18   Frederica Kuster, PA-C  b complex vitamins capsule Take 1 capsule by mouth daily.    [provider]  Coenzyme Q10 (CO Q 10 PO) Take by mouth.    [provider]  ECHINACEA PO Take by mouth.    [provider]  fish oil-omega-3 fatty acids 1000 MG capsule Take 2 g by mouth daily.    [provider]  FLAXSEED, LINSEED, PO Take by mouth.    [provider]  Melatonin 5 MG CAPS Take by mouth.    [provider]  Multiple Vitamins-Minerals (MEGA MULTIVITAMIN FOR WOMEN) TABS Take by mouth.    [provider]  naproxen (NAPROSYN) 500 MG tablet Take 1 tablet (500 mg total) by mouth 2 (two) times daily. 06/21/18   Fredia Sorrow, MD  oseltamivir (TAMIFLU) 75 MG capsule Take 1 capsule (75 mg total) by mouth 2 (two) times daily for 10 days. 10/15/18 10/25/18  Harel Repetto, [place], DO  promethazine (PHENERGAN) 25 MG tablet Take 1 tablet (25 mg total) by mouth every 6 (six) hours as needed for nausea or vomiting. 06/12/18   Margarita Mail, PA-C  spironolactone-hydrochlorothiazide (ALDACTAZIDE) 25-25 MG tablet Take 0.5 tablets by mouth daily. 02/06/18   Revankar, Reita Cliche, MD  VITAMIN A OP Apply to eye.    [provider]  vitamin C (ASCORBIC ACID) 500 MG tablet Take 500 mg by mouth daily.    [provider]  VITAMIN D, CHOLECALCIFEROL, PO Take by mouth.    [provider]    Family History Family History  Problem Relation Age of Onset  . Breast  cancer Mother   . Hypertension Mother   . Hyperlipidemia Mother   . Colon cancer Father   . Cancer Father   . Thyroid disease Sister   . Cancer Other   . Thyroid disease Sister   . Hypertension Maternal Aunt   . Heart failure Maternal Aunt   . Cancer Maternal Aunt   . Hypertension Maternal Aunt   . Hypertension Maternal Uncle   . Diabetes Maternal Uncle   . Heart failure Maternal Grandmother   . Cancer Maternal Grandfather        prostate    Social History Social History   Tobacco Use  . Smoking status: Never Smoker  . Smokeless tobacco: Never Used  Substance Use Topics  . Alcohol use: No  . Drug use: No     Allergies   Lisinopril; Lasix [furosemide]; Nitrofurantoin; Keflex [cephalexin]; and Latex   Review of Systems Review of Systems  Constitutional: Positive for chills, fatigue and fever.  HENT: Positive for congestion and sore throat.   Respiratory: Positive for cough. Negative for chest tightness, shortness of breath and wheezing.   Cardiovascular: Negative for chest pain.  Gastrointestinal: Positive for constipation and nausea. Negative for diarrhea and vomiting.  Musculoskeletal: Positive for myalgias.  All other systems reviewed and are negative.    Physical Exam Updated Vital Signs BP (!) 126/93 (BP Location: Right Arm)   Pulse 91   Temp 99.7 F (37.6 C)   Resp 18   Ht 5\' 4"  (1.626 m)   Wt 103 kg   SpO2 99%   BMI 38.98 kg/m   Physical Exam Vitals signs and nursing note reviewed.  Constitutional:      Appearance: Normal appearance. She is normal weight.  HENT:     Head: Normocephalic and atraumatic.     Nose: Nose normal.     Mouth/Throat:     Mouth: Mucous membranes are moist.     Pharynx: Oropharynx is clear. Posterior oropharyngeal erythema present. No oropharyngeal exudate.  Eyes:     Conjunctiva/sclera: Conjunctivae normal.     Pupils: Pupils are equal, round, and reactive to light.  Neck:     Musculoskeletal: Normal range of  motion and neck supple.  Cardiovascular:     Rate and Rhythm: Normal rate and regular rhythm.     Pulses: Normal pulses.     Heart sounds: No murmur.  Lymphadenopathy:     Cervical: No cervical adenopathy.  Skin:  General: Skin is warm.     Capillary Refill: Capillary refill takes less than 2 seconds.  Neurological:     General: No focal deficit present.     Mental Status: She is alert. Mental status is at baseline.  Psychiatric:        Mood and Affect: Mood normal.        Thought Content: Thought content normal.        Judgment: Judgment normal.      ED Treatments / Results  Labs (all labs ordered are listed, but only abnormal results are displayed) Labs Reviewed  GROUP A STREP BY PCR    EKG None  Radiology No results found.  Procedures Procedures (including critical care time)  Medications Ordered in ED Medications  acetaminophen (TYLENOL) tablet 650 mg (650 mg Oral Given 10/15/18 1550)     Initial Impression / Assessment and Plan / ED Course  I have reviewed the triage vital signs and the nursing notes.  Pertinent labs & imaging results that were available during my care of the patient were reviewed by me and considered in my medical decision making (see chart for details).    Patient with symptoms classic for influenza.  However given sudden onset of fevers with sore throat as well will swab in order to determine if patient may need antibiotics for strep throat.  Strep throat negative.  Will treat as influenza given recent exposure as well as body aches and sudden onset of fever.  We will treat patient with Tamiflu 75mg  BID for 10 days, she is also encouraged to drink plenty of fluids and have rest.  Patient to use ibuprofen and Tylenol as needed for pain and fever.  Given strict return precautions.  [place] Treacy Holcomb, DO PGY-2, [facility]   Final Clinical Impressions(s) / ED Diagnoses   Final diagnoses:  Influenza    ED Discharge  Orders         Ordered    oseltamivir (TAMIFLU) 75 MG capsule  2 times daily     10/15/18 Q[place], [place], DO 10/15/18 1631    Quintella Reichert, MD 10/15/18 2029

## 2018-10-15 NOTE — ED Notes (Signed)
C/o generalized body aches, cough, ears popping, runny nose, weakness, nausea   Fever up to 101 last pm  Took advil aroung 3 hours ago

## 2018-10-18 ENCOUNTER — Other Ambulatory Visit: Payer: Self-pay

## 2018-10-18 ENCOUNTER — Emergency Department (HOSPITAL_BASED_OUTPATIENT_CLINIC_OR_DEPARTMENT_OTHER)
Admission: EM | Admit: 2018-10-18 | Discharge: 2018-10-18 | Disposition: A | Discharge status: Home / Self Care | Payer: Self-pay | Attending: Emergency Medicine | Admitting: Emergency Medicine

## 2018-10-18 ENCOUNTER — Encounter (HOSPITAL_BASED_OUTPATIENT_CLINIC_OR_DEPARTMENT_OTHER): Payer: Self-pay | Admitting: Emergency Medicine

## 2018-10-18 DIAGNOSIS — R51 Headache: Secondary | ICD-10-CM | POA: Insufficient documentation

## 2018-10-18 DIAGNOSIS — Z79899 Other long term (current) drug therapy: Secondary | ICD-10-CM | POA: Insufficient documentation

## 2018-10-18 DIAGNOSIS — I10 Essential (primary) hypertension: Secondary | ICD-10-CM

## 2018-10-18 DIAGNOSIS — R519 Headache, unspecified: Secondary | ICD-10-CM

## 2018-10-18 DIAGNOSIS — J111 Influenza due to unidentified influenza virus with other respiratory manifestations: Secondary | ICD-10-CM

## 2018-10-18 DIAGNOSIS — Z91040 Latex allergy status: Secondary | ICD-10-CM | POA: Insufficient documentation

## 2018-10-18 DIAGNOSIS — R69 Illness, unspecified: Secondary | ICD-10-CM

## 2018-10-18 LAB — COMPREHENSIVE METABOLIC PANEL
ALT: 17 U/L (ref 0–44)
AST: 27 U/L (ref 15–41)
Albumin: 4 g/dL (ref 3.5–5.0)
Alkaline Phosphatase: 55 U/L (ref 38–126)
Anion gap: 5 (ref 5–15)
BUN: 11 mg/dL (ref 6–20)
CO2: 27 mmol/L (ref 22–32)
Calcium: 9 mg/dL (ref 8.9–10.3)
Chloride: 107 mmol/L (ref 98–111)
Creatinine, Ser: 1.02 mg/dL — ABNORMAL HIGH (ref 0.44–1.00)
GFR calc Af Amer: >60 mL/min (ref >60)
GFR calc non Af Amer: >60 mL/min (ref >60)
Glucose, Bld: 77 mg/dL (ref 70–99)
Potassium: 4.4 mmol/L (ref 3.5–5.1)
Sodium: 139 mmol/L (ref 135–145)
Total Bilirubin: 0.6 mg/dL (ref 0.3–1.2)
Total Protein: 8.1 g/dL (ref 6.5–8.1)

## 2018-10-18 LAB — CBC WITH DIFFERENTIAL/PLATELET
Abs Immature Granulocytes: 0.01 10*3/uL (ref 0.00–0.07)
Basophils Absolute: 0.1 10*3/uL (ref 0.0–0.1)
Basophils Relative: 1 %
EOS ABS: 0.3 10*3/uL (ref 0.0–0.5)
Eosinophils Relative: 5 %
HCT: 41.5 % (ref 36.0–46.0)
Hemoglobin: 12.6 g/dL (ref 12.0–15.0)
Immature Granulocytes: 0 %
Lymphocytes Relative: 44 %
Lymphs Abs: 2.7 10*3/uL (ref 0.7–4.0)
MCH: 27.9 pg (ref 26.0–34.0)
MCHC: 30.4 g/dL (ref 30.0–36.0)
MCV: 91.8 fL (ref 80.0–100.0)
MONO ABS: 0.6 10*3/uL (ref 0.1–1.0)
Monocytes Relative: 10 %
Neutro Abs: 2.4 10*3/uL (ref 1.7–7.7)
Neutrophils Relative %: 40 %
Platelets: 359 10*3/uL (ref 150–400)
RBC: 4.52 MIL/uL (ref 3.87–5.11)
RDW: 14.5 % (ref 11.5–15.5)
WBC: 6.1 10*3/uL (ref 4.0–10.5)
nRBC: 0 % (ref 0.0–0.2)

## 2018-10-18 MED ORDER — KETOROLAC TROMETHAMINE 15 MG/ML IJ SOLN
15.0000 mg | Freq: Once | INTRAMUSCULAR | Status: AC
  Administered 2018-10-18: 15 mg via INTRAVENOUS
  Filled 2018-10-18: qty 1

## 2018-10-18 MED ORDER — METOCLOPRAMIDE HCL 5 MG/ML IJ SOLN
10.0000 mg | Freq: Once | INTRAMUSCULAR | Status: AC
  Administered 2018-10-18: 10 mg via INTRAVENOUS
  Filled 2018-10-18: qty 2

## 2018-10-18 MED ORDER — SODIUM CHLORIDE 0.9 % IV BOLUS
500.0000 mL | Freq: Once | INTRAVENOUS | Status: AC
  Administered 2018-10-18: 500 mL via INTRAVENOUS

## 2018-10-18 NOTE — ED Triage Notes (Addendum)
Reports flu like symptoms, seen previously for same.  Reports that she now has high blood pressure with headache.  Additionally reports blurred vision and nausea x 48 hours.  Denies history of hypertension.

## 2018-10-18 NOTE — Discharge Instructions (Addendum)
Flu will likely last for 7 to 10 days.  As such, you should continue to treat symptomatically. Use Tylenol and ibuprofen as needed for headache or body aches. Make sure you are staying well-hydrated with water. Follow-up with your primary care doctor for further evaluation of your symptoms and further management of your blood pressure. Return to the emergency room if you develop vision loss, slurred speech, persistent numbness, chest pain, or any new, worsening, or concerning symptoms.

## 2018-10-18 NOTE — ED Provider Notes (Signed)
[facility] EMERGENCY DEPARTMENT Provider Note   CSN: [id number] Arrival date & time: 10/18/18  1301     History   Chief Complaint Chief Complaint  Patient presents with  . Generalized Body Aches  . Headache    HPI Destiny Henderson is a 58 y.o. female presenting for evaluation of headache and generalized body aches and high blood pressure.  Patient states she was seen 2 days ago with flulike symptoms, diagnosed with flu and started on Tamiflu.  Since then, she reports worsening sided sharp temporal pain.  She reports associated dizziness and bilateral blurred vision.  She reports continued fevers, chills, nasal congestion, and cough.  Cough is waking her up at night.  Patient states she has been checking her blood pressure at home, instead has been is 185/115. Pt with h/o HTN, but is not on medication for this. Pt has h/o migraines, does not take anything for this. States her HA feels different. Pt has nausea, no associated vomiting or abd pain.  She denies vision loss, slurred speech, neck pain, neck stiffness, rash, weakness/numbness, chest pain, shortness of breath, urinary symptoms, normal bowel movements.  Additional history obtained from chart review.  Patiently recently found to have an elevated ESR/sed rate, referred to rheumatology but has not followed up yet.   HPI  Past Medical History:  Diagnosis Date  . Anemia   . Benign tumor of bladder   . DVT (deep venous thrombosis) ([place])   . Essential hypertension, benign 03/03/2013  . Fibroma of heart   . Obesity 03/03/2013    Patient Active Problem List   Diagnosis Date Noted  . Low back pain radiating to right leg 02/17/2018  . Pedal edema 02/06/2018  . History of DVT (deep vein thrombosis) 02/06/2018  . Right wrist injury, subsequent encounter 01/24/2018  . Right knee injury, subsequent encounter 01/24/2018  . Sprain of MCL (medial collateral ligament) of knee 12/12/2016  . Sprain of right ankle 12/12/2016  .  Menorrhagia 05/20/2013  . Essential hypertension, benign 03/03/2013  . Obesity 03/03/2013    Past Surgical History:  Procedure Laterality Date  . BACK SURGERY    . bladder tack    . DILATION AND CURETTAGE OF UTERUS     after SAB  . HEART TUMOR EXCISION       OB History    Gravida  4   Para  3   Term  3   Preterm      AB  1   Living  3     SAB  1   TAB      Ectopic      Multiple      Live Births               Home Medications    Prior to Admission medications   Medication Sig Start Date End Date Taking? Authorizing Provider  acetaminophen (TYLENOL) 500 MG tablet Take 1 tablet (500 mg total) by mouth every 6 (six) hours as needed. 01/21/18   Frederica Kuster, PA-C  b complex vitamins capsule Take 1 capsule by mouth daily.    [provider]  Coenzyme Q10 (CO Q 10 PO) Take by mouth.    [provider]  ECHINACEA PO Take by mouth.    [provider]  fish oil-omega-3 fatty acids 1000 MG capsule Take 2 g by mouth daily.    [provider]  FLAXSEED, LINSEED, PO Take by mouth.    [provider]  Melatonin 5 MG CAPS Take by mouth.    [provider]  Multiple Vitamins-Minerals (MEGA MULTIVITAMIN FOR WOMEN) TABS Take by mouth.    [provider]  naproxen (NAPROSYN) 500 MG tablet Take 1 tablet (500 mg total) by mouth 2 (two) times daily. 06/21/18   Fredia Sorrow, MD  oseltamivir (TAMIFLU) 75 MG capsule Take 1 capsule (75 mg total) by mouth 2 (two) times daily for 10 days. 10/15/18 10/25/18  Shirley, [place], DO  promethazine (PHENERGAN) 25 MG tablet Take 1 tablet (25 mg total) by mouth every 6 (six) hours as needed for nausea or vomiting. 06/12/18   Margarita Mail, PA-C  spironolactone-hydrochlorothiazide (ALDACTAZIDE) 25-25 MG tablet Take 0.5 tablets by mouth daily. 02/06/18   Revankar, Reita Cliche, MD  VITAMIN A OP Apply to eye.    [provider]  vitamin C (ASCORBIC ACID) 500 MG tablet Take 500 mg  by mouth daily.    [provider]  VITAMIN D, CHOLECALCIFEROL, PO Take by mouth.    [provider]    Family History Family History  Problem Relation Age of Onset  . Breast cancer Mother   . Hypertension Mother   . Hyperlipidemia Mother   . Colon cancer Father   . Cancer Father   . Thyroid disease Sister   . Cancer Other   . Thyroid disease Sister   . Hypertension Maternal Aunt   . Heart failure Maternal Aunt   . Cancer Maternal Aunt   . Hypertension Maternal Aunt   . Hypertension Maternal Uncle   . Diabetes Maternal Uncle   . Heart failure Maternal Grandmother   . Cancer Maternal Grandfather        prostate    Social History Social History   Tobacco Use  . Smoking status: Never Smoker  . Smokeless tobacco: Never Used  Substance Use Topics  . Alcohol use: No  . Drug use: No     Allergies   Lisinopril; Lasix [furosemide]; Nitrofurantoin; Keflex [cephalexin]; and Latex   Review of Systems Review of Systems  Constitutional: Positive for fever (improving, but not resolved).  HENT: Positive for congestion.   Eyes: Positive for visual disturbance (Bilateral blurry vision, no vision loss). Negative for photophobia.  Respiratory: Positive for cough.   Gastrointestinal: Positive for nausea.  Musculoskeletal: Positive for myalgias.  Neurological: Positive for headaches.  All other systems reviewed and are negative.    Physical Exam Updated Vital Signs BP (!) 161/86   Pulse 74   Temp 99.1 F (37.3 C) (Oral)   Resp 16   Ht 5' 4"  (1.626 m)   Wt 102.1 kg   SpO2 96%   BMI 38.62 kg/m   Physical Exam Vitals signs and nursing note reviewed.  Constitutional:      General: She is not in acute distress.    Appearance: She is well-developed.     Comments: Sitting in the bed in no acute distress.  HENT:     Head: Normocephalic and atraumatic.     Comments: Congestion/mucosal edema noted.  No tenderness palpation of the sinuses.  No tenderness  palpation over the temporal area.  OP clear without tonsillar swelling or exudate.  Uvula midline with palate rise.  TMs nonerythematous nonbulging bilaterally. Eyes:     Extraocular Movements: Extraocular movements intact.     Conjunctiva/sclera: Conjunctivae normal.     Pupils: Pupils are equal, round, and reactive to light.     Comments: EOMI and PERRLA.  No nystagmus.  Neck:     Musculoskeletal: Normal range of motion and neck supple.     Comments: No neck stiffness or signs of meningismus. Cardiovascular:     Rate and Rhythm: Normal rate and regular rhythm.     Pulses: Normal pulses.  Pulmonary:     Effort: Pulmonary effort is normal. No respiratory distress.     Breath sounds: Normal breath sounds. No wheezing.     Comments: Speaking in full sentences.  Clear lung sounds in all fields. Abdominal:     General: There is no distension.     Palpations: Abdomen is soft. There is no mass.     Tenderness: There is no abdominal tenderness. There is no right CVA tenderness, guarding or rebound.     Comments: No tenderness palpation of the abdomen.  Soft without rigidity, guarding, distention.  Negative rebound.  Musculoskeletal: Normal range of motion.  Skin:    General: Skin is warm and dry.     Capillary Refill: Capillary refill takes less than 2 seconds.  Neurological:     General: No focal deficit present.     Mental Status: She is alert and oriented to person, place, and time.     Cranial Nerves: No cranial nerve deficit.     Sensory: No sensory deficit.     Motor: No weakness.     Gait: Gait normal.     Comments: No obvious neurologic deficits.  CN intact.  Nose to finger intact.  Fine movement coordination intact.  Grip strength intact.  Psychiatric:        Mood and Affect: Mood normal.      ED Treatments / Results  Labs (all labs ordered are listed, but only abnormal results are displayed) Labs Reviewed  COMPREHENSIVE METABOLIC PANEL - Abnormal; Notable for the  following components:      Result Value   Creatinine, Ser 1.02 (*)    All other components within normal limits  CBC WITH DIFFERENTIAL/PLATELET    EKG None  Radiology No results found.  Procedures Procedures (including critical care time)  Medications Ordered in ED Medications  ketorolac (TORADOL) 15 MG/ML injection 15 mg (15 mg Intravenous Given 10/18/18 1456)  metoCLOPramide (REGLAN) injection 10 mg (10 mg Intravenous Given 10/18/18 1456)  sodium chloride 0.9 % bolus 500 mL ( Intravenous Stopped 10/18/18 1518)     Initial Impression / Assessment and Plan / ED Course  I have reviewed the triage vital signs and the nursing notes.  Pertinent labs & imaging results that were available during my care of the patient were reviewed by me and considered in my medical decision making (see chart for details).     Pt presenting for evaluation of headache, high blood pressure, body aches in the setting of flulike illness.  Physical exam reassuring, no neurologic deficits.  She appears nontoxic.  She has been taking Tamiflu, which can cause headaches.  Additionally, she is not been eating or drinking well due to her illness, and reporting increased headaches.  These could be contributing to her blood pressure.  Patient does have a history of high blood pressure, but is not currently taking anything for this.  Additionally, she is taking Advil for her head pain, this may be contributing to her blood pressure.  At this time, low suspicion for hypertensive urgency or emergency.  Low suspicion for TIA, CVA, ICH, or infection.  No signs of meningitis.  As this is her second visit, will obtain basic labs.  Will give headache  cocktail and reassess.  Labs reassuring, no leukocytosis.  Electrolytes stable.  Kidney function stable.  Visual acuity reassuring.  On reassessment, headache and nausea have resolved, although patient states she is feeling slightly hyperactive.  Informed her this may be a side  effect of the Reglan, she may try Benadryl if she wants.  Patient states she just wants to leave.  Encouraged follow-up with her PCP for further evaluation of her symptoms and further management of her blood pressure.  At this time, patient appears safe for  discharge.  Return precautions given.  Patient states she understands and agrees plan.  Final Clinical Impressions(s) / ED Diagnoses   Final diagnoses:  Influenza-like illness  Hypertension, unspecified type  Acute nonintractable headache, unspecified headache type    ED Discharge Orders    None       Franchot Heidelberg, PA-C 10/18/18 1543    Sherwood Gambler, MD 10/19/18 [phone]

## 2018-10-18 NOTE — ED Notes (Signed)
Pt asked for fluids to be stopped and IV to be taken out so she can leave with another pt who is her ride.

## 2018-11-24 ENCOUNTER — Ambulatory Visit (INDEPENDENT_AMBULATORY_CARE_PROVIDER_SITE_OTHER): Payer: 59 | Admitting: Cardiology

## 2018-11-24 ENCOUNTER — Encounter: Payer: Self-pay | Admitting: Cardiology

## 2018-11-24 VITALS — BP 200/130 | HR 72 | Resp 16 | Ht 64.0 in | Wt 218.0 lb

## 2018-11-24 DIAGNOSIS — I10 Essential (primary) hypertension: Secondary | ICD-10-CM

## 2018-11-24 DIAGNOSIS — R600 Localized edema: Secondary | ICD-10-CM | POA: Diagnosis not present

## 2018-11-24 DIAGNOSIS — R42 Dizziness and giddiness: Secondary | ICD-10-CM | POA: Insufficient documentation

## 2018-11-24 HISTORY — DX: Dizziness and giddiness: R42

## 2018-11-24 MED ORDER — TELMISARTAN 40 MG PO TABS: 40.0000 mg | ORAL_TABLET | Freq: Every day | ORAL | 0 refills | Status: DC

## 2018-11-24 NOTE — Patient Instructions (Signed)
Medication Instructions:  Your physician has recommended you make the following change in your medication:  Start Telmisartan 40 mg 1 tablet daily   If you need a refill on your cardiac medications before your next appointment, please call your pharmacy.   Lab work: Your physician recommends that you return for lab work in: [facility]  If you have labs (blood work) drawn today and your tests are completely normal, you will receive your results only by: Marland Kitchen MyChart Message (if you have MyChart) OR . A paper copy in the mail If you have any lab test that is abnormal or we need to change your treatment, we will call you to review the results.  Testing/Procedures: Your physician has requested that you have an echocardiogram. Echocardiography is a painless test that uses sound waves to create images of your heart. It provides your doctor with information about the size and shape of your heart and how well your heart's chambers and valves are working. This procedure takes approximately one hour. There are no restrictions for this procedure.  EKG Today  Follow-Up: At [facility], you and your health needs are our priority.  As part of our continuing mission to provide you with exceptional heart care, we have created designated Provider Care Teams.  These Care Teams include your primary Cardiologist (physician) and Advanced Practice Providers (APPs -  Physician Assistants and Nurse Practitioners) who all work together to provide you with the care you need, when you need it. You will need a follow up appointment in 1 months.   You may see Jyl Heinz or another member of our [employer] Provider Team in [place]: Shirlee More, MD . Jyl Heinz, MD

## 2018-11-24 NOTE — Progress Notes (Signed)
Cardiology Office Note:    Date:  11/24/2018   ID:  Destiny Henderson, DOB 1961-09-29, MRN [id number]  PCP:  Destiny Hatchet, FNP  Cardiologist:  Destiny Campus, MD    Referring MD: Destiny Hatchet, FNP   Chief Complaint  Patient presents with  . Dizziness  . Headache  I do not feel well  History of Present Illness:    Destiny Henderson is a 58 y.o. female with hypertension.  Apparently she had been diagnosed with hypertension a few years ago.  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  She is been doing quite well however lately she has been struggling with high blood pressure I think 1 of the biggest obstacles in management of this problem is the fact that she does not want to take medications.  She is scared of side effects of those medications.  She is trying to manage her high blood pressure holistically.  We spent Gradle of time talking about the issue of high blood pressure and I told her that management of high blood pressure in her situation will be quite some medication we will schedule her to have echocardiogram as well as EKG to check to see if she gets significant left ventricle hypertrophy.  She is afraid to take any medication however I was able to convince her to start taking small dose of Micardis 40 mg daily only.  Previously she try some ARB and apparently she started having some problem with her hair this why she decided to stop it.  She also tried to take beta-blocker with some problem she does not want to take diuretic she was trying some and felt poorly overall anticipated have some difficulty finding with medications for her.  We talked about nonpharmacological logical ways to manage her blood pressure which can include relaxation technique and she does already yoga, diet avoiding salty food, weight loss.  She does not snore therefore I do not think she get  significant sleep apnea.  All this issue were discussed in length with her.  Past Medical History:  Diagnosis Date  . Anemia   . Benign tumor of bladder   . DVT (deep venous thrombosis) ([place])   . Essential hypertension, benign 03/03/2013  . Fibroma of heart   . Obesity 03/03/2013    Past Surgical History:  Procedure Laterality Date  . BACK SURGERY    . bladder tack    . DILATION AND CURETTAGE OF UTERUS     after SAB  . HEART TUMOR EXCISION      Current Medications: Current Meds  Medication Sig  . b complex vitamins capsule Take 1 capsule by mouth daily.  . Coenzyme Q10 (CO Q 10 PO) Take by mouth.  . ECHINACEA PO Take by mouth.  . fish oil-omega-3 fatty acids 1000 MG capsule Take 2 g by mouth daily.  Marland Kitchen FLAXSEED, LINSEED, PO Take by mouth.  . Melatonin 5 MG CAPS Take by mouth as needed.   . Multiple Vitamins-Minerals (MEGA MULTIVITAMIN FOR WOMEN) TABS Take by mouth.  . vitamin C (ASCORBIC ACID) 500 MG tablet Take 500 mg by mouth daily.  Marland Kitchen VITAMIN D, CHOLECALCIFEROL, PO Take by mouth.     Allergies:   Lisinopril; Lasix [furosemide]; Nitrofurantoin; Keflex [cephalexin]; and Latex   Social History   Socioeconomic History  . Marital status: Married    Spouse name:  Not on file  . Number of children: Not on file  . Years of education: Not on file  . Highest education level: Not on file  Occupational History  . Occupation: Network engineer  Social Needs  . Financial resource strain: Not on file  . Food insecurity:    Worry: Not on file    Inability: Not on file  . Transportation needs:    Medical: Not on file    Non-medical: Not on file  Tobacco Use  . Smoking status: Never Smoker  . Smokeless tobacco: Never Used  Substance and Sexual Activity  . Alcohol use: No  . Drug use: No  . Sexual activity: Not on file  Lifestyle  . Physical activity:    Days per week: Not on file    Minutes per session: Not on file  . Stress: Not on file  Relationships  . Social connections:      Talks on phone: Not on file    Gets together: Not on file    Attends religious service: Not on file    Active member of club or organization: Not on file    Attends meetings of clubs or organizations: Not on file    Relationship status: Not on file  Other Topics Concern  . Not on file  Social History Narrative  . Not on file     Family History: The patient's family history includes Breast cancer in her mother; Cancer in her father, maternal aunt, maternal grandfather, and another family member; Colon cancer in her father; Diabetes in her maternal uncle; Heart failure in her maternal aunt and maternal grandmother; Hyperlipidemia in her mother; Hypertension in her maternal aunt, maternal aunt, maternal uncle, and mother; Thyroid disease in her sister and sister. ROS:   Please see the history of present illness.    All 14 point review of systems negative except as described per history of present illness  EKGs/Labs/Other Studies Reviewed:    EKG done today showed normal sinus rhythm normal P interval Q waves V1 V2, nonspecific ST-T segment changes.  There was a asymmetrical teen version in inferior lateral leads  Recent Labs: 10/18/2018: ALT 17; BUN 11; Creatinine, Ser 1.02; Hemoglobin 12.6; Platelets 359; Potassium 4.4; Sodium 139  Recent Lipid Panel No results found for: CHOL, TRIG, HDL, CHOLHDL, VLDL, LDLCALC, LDLDIRECT  Physical Exam:    VS:  BP (!) 200/130 (BP Location: Right Arm)   Pulse 72   Resp 16   Ht 5\' 4"  (1.626 m)   Wt 218 lb (98.9 kg)   SpO2 98%   BMI 37.42 kg/m     Wt Readings from Last 3 Encounters:  11/24/18 218 lb (98.9 kg)  10/18/18 225 lb (102.1 kg)  10/15/18 227 lb 1.2 oz (103 kg)     GEN:  Well nourished, well developed in no acute distress HEENT: Normal NECK: No JVD; No carotid bruits LYMPHATICS: No lymphadenopathy CARDIAC: RRR, no murmurs, no rubs, no gallops RESPIRATORY:  Clear to auscultation without rales, wheezing or rhonchi  ABDOMEN:  Soft, non-tender, non-distended MUSCULOSKELETAL:  No edema; No deformity  SKIN: Warm and dry LOWER EXTREMITIES: no swelling NEUROLOGIC:  Alert and oriented x 3 PSYCHIATRIC:  Normal affect   ASSESSMENT:    1. Essential hypertension, benign   2. Dizziness   3. Pedal edema    PLAN:    In order of problems listed above:  1. Essential hypertension we will try small dose of Micardis echocardiogram and EKG will be done to  check for LVH. 2. Denies.  She was told she needs to go to the emergency room she does not want to do it.  I told her that the situation will get worse she must go there. 3. It will edema that she had when she was taking Norvasc/amlodipine.  Does not want to take this medication anymore.  I told her also that her high blood pressure will most likely require combination of medications.   Medication Adjustments/Labs and Tests Ordered: Current medicines are reviewed at length with the patient today.  Concerns regarding medicines are outlined above.  No orders of the defined types were placed in this encounter.  Medication changes: No orders of the defined types were placed in this encounter.   Signed, Park Liter, MD, [facility] 11/24/2018 1:11 PM    [place]

## 2018-11-25 ENCOUNTER — Other Ambulatory Visit: Payer: Self-pay

## 2018-11-25 ENCOUNTER — Emergency Department (HOSPITAL_BASED_OUTPATIENT_CLINIC_OR_DEPARTMENT_OTHER)
Admission: EM | Admit: 2018-11-25 | Discharge: 2018-11-25 | Disposition: A | Discharge status: Home / Self Care | Payer: 59 | Attending: Emergency Medicine | Admitting: Emergency Medicine

## 2018-11-25 ENCOUNTER — Emergency Department (HOSPITAL_BASED_OUTPATIENT_CLINIC_OR_DEPARTMENT_OTHER): Payer: 59

## 2018-11-25 ENCOUNTER — Encounter (HOSPITAL_BASED_OUTPATIENT_CLINIC_OR_DEPARTMENT_OTHER): Payer: Self-pay | Admitting: Emergency Medicine

## 2018-11-25 ENCOUNTER — Telehealth: Payer: Self-pay | Admitting: Emergency Medicine

## 2018-11-25 DIAGNOSIS — I10 Essential (primary) hypertension: Secondary | ICD-10-CM | POA: Diagnosis not present

## 2018-11-25 DIAGNOSIS — R42 Dizziness and giddiness: Secondary | ICD-10-CM | POA: Insufficient documentation

## 2018-11-25 DIAGNOSIS — Z91040 Latex allergy status: Secondary | ICD-10-CM | POA: Insufficient documentation

## 2018-11-25 DIAGNOSIS — R0789 Other chest pain: Secondary | ICD-10-CM | POA: Insufficient documentation

## 2018-11-25 DIAGNOSIS — R002 Palpitations: Secondary | ICD-10-CM | POA: Diagnosis not present

## 2018-11-25 DIAGNOSIS — Z79899 Other long term (current) drug therapy: Secondary | ICD-10-CM | POA: Insufficient documentation

## 2018-11-25 DIAGNOSIS — R55 Syncope and collapse: Secondary | ICD-10-CM | POA: Diagnosis present

## 2018-11-25 LAB — BASIC METABOLIC PANEL
Anion gap: 9 (ref 5–15)
BUN/Creatinine Ratio: 10 (ref 9–23)
BUN: 9 mg/dL (ref 6–24)
BUN: 11 mg/dL (ref 6–20)
CALCIUM: 9.6 mg/dL (ref 8.7–10.2)
CHLORIDE: 103 mmol/L (ref 96–106)
CO2: 26 mmol/L (ref 20–29)
CO2: 23 mmol/L (ref 22–32)
Calcium: 9.2 mg/dL (ref 8.9–10.3)
Chloride: 106 mmol/L (ref 98–111)
Creatinine, Ser: 0.88 mg/dL (ref 0.57–1.00)
Creatinine, Ser: 0.98 mg/dL (ref 0.44–1.00)
GFR calc Af Amer: 85 mL/min/{1.73_m2} (ref >59)
GFR calc Af Amer: >60 mL/min (ref >60)
GFR calc non Af Amer: 74 mL/min/{1.73_m2} (ref >59)
GFR calc non Af Amer: >60 mL/min (ref >60)
Glucose, Bld: 93 mg/dL (ref 70–99)
Glucose: 87 mg/dL (ref 65–99)
POTASSIUM: 4.3 mmol/L (ref 3.5–5.2)
POTASSIUM: 3.6 mmol/L (ref 3.5–5.1)
Sodium: 141 mmol/L (ref 134–144)
Sodium: 138 mmol/L (ref 135–145)

## 2018-11-25 LAB — CBC
HCT: 41.4 % (ref 36.0–46.0)
Hemoglobin: 13 g/dL (ref 12.0–15.0)
MCH: 28.6 pg (ref 26.0–34.0)
MCHC: 31.4 g/dL (ref 30.0–36.0)
MCV: 91 fL (ref 80.0–100.0)
Platelets: 357 10*3/uL (ref 150–400)
RBC: 4.55 MIL/uL (ref 3.87–5.11)
RDW: 14.8 % (ref 11.5–15.5)
WBC: 5.7 10*3/uL (ref 4.0–10.5)
nRBC: 0 % (ref 0.0–0.2)

## 2018-11-25 LAB — PREGNANCY, URINE: Preg Test, Ur: NEGATIVE

## 2018-11-25 LAB — TROPONIN I
Troponin I: <0.03 ng/mL (ref <0.03)
Troponin I: <0.03 ng/mL (ref <0.03)

## 2018-11-25 MED ORDER — LORAZEPAM 2 MG/ML IJ SOLN
1.0000 mg | Freq: Once | INTRAMUSCULAR | Status: AC
  Administered 2018-11-25: 1 mg via INTRAVENOUS
  Filled 2018-11-25: qty 1

## 2018-11-25 MED ORDER — KETOROLAC TROMETHAMINE 30 MG/ML IJ SOLN
15.0000 mg | Freq: Once | INTRAMUSCULAR | Status: AC
  Administered 2018-11-25: 15 mg via INTRAVENOUS
  Filled 2018-11-25: qty 1

## 2018-11-25 MED ORDER — SODIUM CHLORIDE 0.9% FLUSH
3.0000 mL | Freq: Once | INTRAVENOUS | Status: DC
  Filled 2018-11-25: qty 3

## 2018-11-25 NOTE — Telephone Encounter (Signed)
Patient calling in complaining of chest palpitations, chest pain, slurred speech, a headache, and dizziness that began at work today. 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 She is worried it may be the medication we started her on yesterday telmisartan 40 mg daily at this point with her symptoms I have advised patient to go to emergency department, and I have explained the urgency in this due to her blood pressure and symptoms, she verbally understands and is being driven there.

## 2018-11-25 NOTE — ED Provider Notes (Signed)
[facility] EMERGENCY DEPARTMENT Provider Note   CSN: [id number] Arrival date & time: 11/25/18  1010    History   Chief Complaint Chief Complaint  Patient presents with  . Loss of Consciousness  . Chest Pain    HPI Yardley BOBBE QUILTER is a 58 y.o. female.     58 year old female with past medical history including hypertension, DVT not currently on anticoagulation who presents with multiple complaints.  Patient reports that she commonly has problems with headaches especially in the mornings.  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  She was seen yesterday by primary care provider and cardiologist for her blood pressure and was given a new medication, myocarditis, which she first took this morning.  Patient states that yesterday she was having some dizziness, headaches, and not feeling well.  This morning upon waking she had a headache that has progressively worsened.  She also notes that this morning around 6 AM she began having left-sided chest pressure that sometimes goes to her left shoulder.  The pain is intermittent and not associated with shortness of breath.  She went on to work and there she began having dizziness, heart palpitations, and unsteady gait.  They sent her home from work and she came to the ED for evaluation.  She got lightheaded and "passed out" in ED triage although it was witnessed that she was awake and did not fall or hit head. She reports feeling shaky. She intermittently has LE edema but has not tolerated diuretics in the past. No recent travel.  The history is provided by the patient.  Loss of Consciousness  Associated symptoms: chest pain   Chest Pain  Associated symptoms: syncope     Past Medical History:  Diagnosis Date  . Anemia   . Benign tumor of bladder   . DVT (deep venous thrombosis) ([place])   . Essential hypertension, benign  03/03/2013  . Fibroma of heart   . Obesity 03/03/2013    Patient Active Problem List   Diagnosis Date Noted  . Dizziness 11/24/2018  . Low back pain radiating to right leg 02/17/2018  . Pedal edema 02/06/2018  . History of DVT (deep vein thrombosis) 02/06/2018  . Right wrist injury, subsequent encounter 01/24/2018  . Right knee injury, subsequent encounter 01/24/2018  . Sprain of MCL (medial collateral ligament) of knee 12/12/2016  . Sprain of right ankle 12/12/2016  . Menorrhagia 05/20/2013  . Essential hypertension, benign 03/03/2013  . Obesity 03/03/2013    Past Surgical History:  Procedure Laterality Date  . BACK SURGERY    . bladder tack    . DILATION AND CURETTAGE OF UTERUS     after SAB  . HEART TUMOR EXCISION       OB History    Gravida  4   Para  3   Term  3   Preterm      AB  1   Living  3     SAB  1   TAB      Ectopic      Multiple      Live Births               Home Medications    Prior to Admission medications   Medication Sig Start Date End Date Taking? Authorizing Provider  acetaminophen (TYLENOL) 500 MG tablet Take 1 tablet (500 mg total) by mouth  every 6 (six) hours as needed. Patient not taking: Reported on 11/24/2018 01/21/18   Frederica Kuster, PA-C  b complex vitamins capsule Take 1 capsule by mouth daily.    [provider]  Coenzyme Q10 (CO Q 10 PO) Take by mouth.    [provider]  ECHINACEA PO Take by mouth.    [provider]  fish oil-omega-3 fatty acids 1000 MG capsule Take 2 g by mouth daily.    [provider]  FLAXSEED, LINSEED, PO Take by mouth.    [provider]  Melatonin 5 MG CAPS Take by mouth as needed.     [provider]  Multiple Vitamins-Minerals (MEGA MULTIVITAMIN FOR WOMEN) TABS Take by mouth.    [provider]  naproxen (NAPROSYN) 500 MG tablet Take 1 tablet (500 mg total) by mouth 2 (two) times daily. Patient not taking: Reported on  11/24/2018 06/21/18   Fredia Sorrow, MD  promethazine (PHENERGAN) 25 MG tablet Take 1 tablet (25 mg total) by mouth every 6 (six) hours as needed for nausea or vomiting. Patient not taking: Reported on 11/24/2018 06/12/18   Margarita Mail, PA-C  spironolactone-hydrochlorothiazide (ALDACTAZIDE) 25-25 MG tablet Take 0.5 tablets by mouth daily. Patient not taking: Reported on 11/24/2018 02/06/18   Revankar, Reita Cliche, MD  telmisartan (MICARDIS) 40 MG tablet Take 1 tablet (40 mg total) by mouth daily. 11/24/18   Park Liter, MD  VITAMIN A OP Apply to eye.    [provider]  vitamin C (ASCORBIC ACID) 500 MG tablet Take 500 mg by mouth daily.    [provider]  VITAMIN D, CHOLECALCIFEROL, PO Take by mouth.    [provider]    Family History Family History  Problem Relation Age of Onset  . Breast cancer Mother   . Hypertension Mother   . Hyperlipidemia Mother   . Colon cancer Father   . Cancer Father   . Thyroid disease Sister   . Cancer Other   . Thyroid disease Sister   . Hypertension Maternal Aunt   . Heart failure Maternal Aunt   . Cancer Maternal Aunt   . Hypertension Maternal Aunt   . Hypertension Maternal Uncle   . Diabetes Maternal Uncle   . Heart failure Maternal Grandmother   . Cancer Maternal Grandfather        prostate    Social History Social History   Tobacco Use  . Smoking status: Never Smoker  . Smokeless tobacco: Never Used  Substance Use Topics  . Alcohol use: No  . Drug use: No     Allergies   Lisinopril; Lasix [furosemide]; Nitrofurantoin; Keflex [cephalexin]; and Latex   Review of Systems Review of Systems  Cardiovascular: Positive for chest pain and syncope.   All other systems reviewed and are negative except that which was mentioned in HPI   Physical Exam Updated Vital Signs BP (!) 161/10 (BP Location: Left Arm)   Pulse 86   Temp 98.2 F (36.8 C) (Oral)   Resp 18   Ht 5\' 4"  (1.626 m)   Wt 98.9 kg   SpO2  99%   BMI 37.42 kg/m   Physical Exam Vitals signs and nursing note reviewed.  Constitutional:      General: She is not in acute distress.    Appearance: She is well-developed.  HENT:     Head: Normocephalic and atraumatic.  Eyes:     Extraocular Movements: Extraocular movements intact.     Conjunctiva/sclera: Conjunctivae  normal.     Pupils: Pupils are equal, round, and reactive to light.  Neck:     Musculoskeletal: Neck supple.  Cardiovascular:     Rate and Rhythm: Normal rate and regular rhythm.     Heart sounds: Normal heart sounds. No murmur.  Pulmonary:     Effort: Pulmonary effort is normal.     Breath sounds: Normal breath sounds.  Abdominal:     General: Bowel sounds are normal. There is no distension.     Palpations: Abdomen is soft.     Tenderness: There is no abdominal tenderness.  Skin:    General: Skin is warm and dry.  Neurological:     Mental Status: She is alert and oriented to person, place, and time.     Cranial Nerves: No cranial nerve deficit.     Sensory: Sensation is intact.     Motor: Motor function is intact. No weakness.     Coordination: Finger-Nose-Finger Test normal.     Comments: Fluent speech  Psychiatric:        Mood and Affect: Mood is anxious.        Judgment: Judgment normal.     Comments: Tremulous, very anxious      ED Treatments / Results  Labs (all labs ordered are listed, but only abnormal results are displayed) Labs Reviewed  BASIC METABOLIC PANEL  CBC  TROPONIN I  PREGNANCY, URINE  TROPONIN I    EKG EKG Interpretation  Date/Time:  Tuesday November 25 2018 10:20:54 EST Ventricular Rate:  88 PR Interval:    QRS Duration: 99 QT Interval:  392 QTC Calculation: 475 R Axis:   -11 Text Interpretation:  Sinus rhythm Left ventricular hypertrophy Artifact in lead(s) I II aVR aVL aVF V1 similar to previous Confirmed by Theotis Burrow [phone]) on 11/25/2018 12:48:26 PM   Radiology Dg Chest 2 View  Result Date:  11/25/2018 CLINICAL DATA:  58 year old female with dizziness and high blood pressure EXAM: CHEST - 2 VIEW COMPARISON:  05/11/2018 FINDINGS: Cardiomediastinal silhouette unchanged in size and contour. Surgical changes of median sternotomy. No pneumothorax. No pleural effusion. Coarsened interstitial markings similar to prior. No confluent airspace disease. No displaced fracture. IMPRESSION: Chronic lung changes without evidence of superimposed acute cardiopulmonary disease Electronically Signed   By: Corrie Mckusick D.O.   On: 11/25/2018 10:38   Ct Head Wo Contrast  Result Date: 11/25/2018 CLINICAL DATA:  Morning headaches. Unsteady gait and dizziness. Elevated blood pressure. EXAM: CT HEAD WITHOUT CONTRAST TECHNIQUE: Contiguous axial images were obtained from the base of the skull through the vertex without intravenous contrast. COMPARISON:  05/11/2018 FINDINGS: Brain: No evidence of acute infarction, hemorrhage, hydrocephalus, extra-axial collection or mass lesion/mass effect. Vascular: No hyperdense vessel or unexpected calcification. Skull: Normal. Negative for fracture or focal lesion. Sinuses/Orbits: No acute finding. Other: None IMPRESSION: 1. No acute intracranial abnormality.  Normal brain. Electronically Signed   By: Kerby Moors M.D.   On: 11/25/2018 11:54    Procedures Procedures (including critical care time)  Medications Ordered in ED Medications  sodium chloride flush (NS) 0.9 % injection 3 mL (3 mLs Intravenous Not Given 11/25/18 1026)  LORazepam (ATIVAN) injection 1 mg (1 mg Intravenous Given 11/25/18 1140)  ketorolac (TORADOL) 30 MG/ML injection 15 mg (15 mg Intravenous Given 11/25/18 1442)     Initial Impression / Assessment and Plan / ED Course  I have reviewed the triage vital signs and the nursing notes.  Pertinent labs & imaging results that were available  during my care of the patient were reviewed by me and considered in my medical decision making (see chart for  details).        I initially saw pt in triage where she became "faint"-- was on the ground moaning and anxious but awake. Did not hit head or actually fall. Initially severely hypertensive which improved without intervention. EKG without acute ischemic changes.  Gave Ativan for dizziness, nausea, and anxiety.  Lab work including serial troponins normal.  Chest x-ray negative acute.  Because of recurrent headaches, obtain CT of head to rule out hemorrhage or mass.  CT was normal.    I suspect her headaches are related to poorly controlled hypertension.  Her cardiologist, Dr. Agustin Cree, came here from downstairs to see her and chat with me. I appreciate his assistance.  It sounds like she has been challenging to manage as she is resistant to taking multiple medications, stating various side effects with each medication.  I do not feel that today's symptoms represent side effects to her newest medication.  He agreed with plan for serial troponins and d/c.  She already has outpatient echo scheduled.  On reassessment after ativan, she is calmer and well appearing. BP has improved without intervention.  Emphasized importance of compliance with medications and follow-up with PCP for her symptoms, follow-up with cardiologist after her echo.  Return precautions reviewed and she voiced understanding. Final Clinical Impressions(s) / ED Diagnoses   Final diagnoses:  Essential hypertension  Atypical chest pain  Palpitations  Dizziness    ED Discharge Orders    None       Janes Colegrove, Wenda Overland, MD 11/25/18 1529

## 2018-11-25 NOTE — ED Triage Notes (Signed)
Pt reports was sent home today from work , unsteady gait , dizziness , High BP reading . Had a syncope episode at triage and was lowered to the floor, no injury.  Reports headache , on a new med for BP x 1 day. Also reports  Left chest pain. Pt is agitated , alert and oriented x 4.

## 2018-11-28 ENCOUNTER — Emergency Department (HOSPITAL_BASED_OUTPATIENT_CLINIC_OR_DEPARTMENT_OTHER)
Admission: EM | Admit: 2018-11-28 | Discharge: 2018-11-28 | Disposition: A | Discharge status: Home / Self Care | Payer: 59 | Attending: Emergency Medicine | Admitting: Emergency Medicine

## 2018-11-28 ENCOUNTER — Other Ambulatory Visit: Payer: Self-pay

## 2018-11-28 ENCOUNTER — Encounter (HOSPITAL_BASED_OUTPATIENT_CLINIC_OR_DEPARTMENT_OTHER): Payer: Self-pay | Admitting: Emergency Medicine

## 2018-11-28 ENCOUNTER — Emergency Department (HOSPITAL_BASED_OUTPATIENT_CLINIC_OR_DEPARTMENT_OTHER): Payer: 59

## 2018-11-28 DIAGNOSIS — R110 Nausea: Secondary | ICD-10-CM | POA: Insufficient documentation

## 2018-11-28 DIAGNOSIS — Y9329 Activity, other involving ice and snow: Secondary | ICD-10-CM | POA: Insufficient documentation

## 2018-11-28 DIAGNOSIS — S3992XA Unspecified injury of lower back, initial encounter: Secondary | ICD-10-CM | POA: Diagnosis present

## 2018-11-28 DIAGNOSIS — Z79899 Other long term (current) drug therapy: Secondary | ICD-10-CM | POA: Diagnosis not present

## 2018-11-28 DIAGNOSIS — I10 Essential (primary) hypertension: Secondary | ICD-10-CM

## 2018-11-28 DIAGNOSIS — Y929 Unspecified place or not applicable: Secondary | ICD-10-CM | POA: Insufficient documentation

## 2018-11-28 DIAGNOSIS — Y999 Unspecified external cause status: Secondary | ICD-10-CM | POA: Insufficient documentation

## 2018-11-28 DIAGNOSIS — W000XXA Fall on same level due to ice and snow, initial encounter: Secondary | ICD-10-CM | POA: Diagnosis not present

## 2018-11-28 DIAGNOSIS — R51 Headache: Secondary | ICD-10-CM | POA: Diagnosis not present

## 2018-11-28 DIAGNOSIS — S20229A Contusion of unspecified back wall of thorax, initial encounter: Secondary | ICD-10-CM | POA: Diagnosis not present

## 2018-11-28 DIAGNOSIS — Z91040 Latex allergy status: Secondary | ICD-10-CM | POA: Diagnosis not present

## 2018-11-28 LAB — URINALYSIS, ROUTINE W REFLEX MICROSCOPIC
Bilirubin Urine: NEGATIVE
Glucose, UA: NEGATIVE mg/dL
Hgb urine dipstick: NEGATIVE
Ketones, ur: NEGATIVE mg/dL
Leukocytes,Ua: NEGATIVE
Nitrite: NEGATIVE
PROTEIN: NEGATIVE mg/dL
Specific Gravity, Urine: 1.025 (ref 1.005–1.030)
pH: 6 (ref 5.0–8.0)

## 2018-11-28 MED ORDER — ONDANSETRON 4 MG PO TBDP
4.0000 mg | ORAL_TABLET | Freq: Once | ORAL | Status: AC
  Administered 2018-11-28: 4 mg via ORAL

## 2018-11-28 MED ORDER — ACETAMINOPHEN 500 MG PO TABS
1000.0000 mg | ORAL_TABLET | Freq: Once | ORAL | Status: AC
  Administered 2018-11-28: 1000 mg via ORAL
  Filled 2018-11-28: qty 2

## 2018-11-28 MED ORDER — TELMISARTAN-HCTZ 80-25 MG PO TABS: 1.0000 | ORAL_TABLET | Freq: Every day | ORAL | 0 refills | Status: DC

## 2018-11-28 MED ORDER — HYDROCHLOROTHIAZIDE 25 MG PO TABS
25.0000 mg | ORAL_TABLET | Freq: Once | ORAL | Status: AC
  Administered 2018-11-28: 25 mg via ORAL
  Filled 2018-11-28: qty 1

## 2018-11-28 MED ORDER — CYCLOBENZAPRINE HCL 5 MG PO TABS
5.0000 mg | ORAL_TABLET | Freq: Once | ORAL | Status: AC
  Administered 2018-11-28: 5 mg via ORAL
  Filled 2018-11-28: qty 1

## 2018-11-28 MED ORDER — TRAMADOL HCL 50 MG PO TABS: 50.0000 mg | ORAL_TABLET | Freq: Four times a day (QID) | ORAL | 0 refills | Status: DC | PRN

## 2018-11-28 MED ORDER — CYCLOBENZAPRINE HCL 5 MG PO TABS: 5.0000 mg | ORAL_TABLET | Freq: Three times a day (TID) | ORAL | 0 refills | Status: DC | PRN

## 2018-11-28 MED ORDER — ONDANSETRON 4 MG PO TBDP
ORAL_TABLET | ORAL | Status: AC
  Administered 2018-11-28: 4 mg via ORAL
  Filled 2018-11-28: qty 1

## 2018-11-28 MED ORDER — HYDROCODONE-ACETAMINOPHEN 5-325 MG PO TABS
2.0000 | ORAL_TABLET | Freq: Once | ORAL | Status: DC
  Filled 2018-11-28: qty 2

## 2018-11-28 NOTE — Discharge Instructions (Signed)
Take tylenol, motrin for pain   Take tramadol for severe pain   Take flexeril for muscle spasms.   Increase micardis to 80 mg-25 mg daily   See your doctor in a week to recheck your blood pressure   Return to ER if you have worse back pain, numbness, weakness, chest pain, trouble breathing

## 2018-11-28 NOTE — ED Triage Notes (Addendum)
Reports fall on ice today.  C/o lower back pain.  Additionally states "my BP medication that you put me on 2 days ago isn't working".

## 2018-11-28 NOTE — ED Provider Notes (Signed)
[facility] EMERGENCY DEPARTMENT Provider Note   CSN: [id number] Arrival date & time: 11/28/18  1754    History   Chief Complaint Chief Complaint  Patient presents with  . Fall    HPI Destiny Henderson is a 58 y.o. female history of previous DVT, anemia, here presenting with back pain.  Patient was seen in the ED several days ago for chest pain and dizziness and headaches.  Patient was found to have symptomatic hypertension and had unremarkable labs including 2 negative troponins.  Patient was started on micardis and has been compliant with it. Patient states that she has persistent headaches.  Patient states that she was cleaning her car from the snow this morning and slipped on ice and landed on her lower back.  She states that she had persistent lower back pain since then.  Been taking ibuprofen with no relief.  Denies any head injury or loss of consciousness.  Denies any chest pain or shortness of breath.  Patient states that she had previous lumbar surgery and denies any numbness or weakness or incontinence after the fall.      The history is provided by the patient.    Past Medical History:  Diagnosis Date  . Anemia   . Benign tumor of bladder   . DVT (deep venous thrombosis) ([place])   . Essential hypertension, benign 03/03/2013  . Fibroma of heart   . Obesity 03/03/2013    Patient Active Problem List   Diagnosis Date Noted  . Dizziness 11/24/2018  . Low back pain radiating to right leg 02/17/2018  . Pedal edema 02/06/2018  . History of DVT (deep vein thrombosis) 02/06/2018  . Right wrist injury, subsequent encounter 01/24/2018  . Right knee injury, subsequent encounter 01/24/2018  . Sprain of MCL (medial collateral ligament) of knee 12/12/2016  . Sprain of right ankle 12/12/2016  . Menorrhagia 05/20/2013  . Essential hypertension, benign 03/03/2013  . Obesity 03/03/2013    Past Surgical History:  Procedure Laterality Date  . BACK SURGERY    . bladder tack     . DILATION AND CURETTAGE OF UTERUS     after SAB  . HEART TUMOR EXCISION       OB History    Gravida  4   Para  3   Term  3   Preterm      AB  1   Living  3     SAB  1   TAB      Ectopic      Multiple      Live Births               Home Medications    Prior to Admission medications   Medication Sig Start Date End Date Taking? Authorizing Provider  acetaminophen (TYLENOL) 500 MG tablet Take 1 tablet (500 mg total) by mouth every 6 (six) hours as needed. Patient not taking: Reported on 11/24/2018 01/21/18   Frederica Kuster, PA-C  b complex vitamins capsule Take 1 capsule by mouth daily.    [provider]  Coenzyme Q10 (CO Q 10 PO) Take by mouth.    [provider]  ECHINACEA PO Take by mouth.    [provider]  fish oil-omega-3 fatty acids 1000 MG capsule Take 2 g by mouth daily.    [provider]  FLAXSEED, LINSEED, PO Take by mouth.    [provider]  Melatonin 5 MG CAPS Take by mouth as needed.  [provider]  Multiple Vitamins-Minerals (MEGA MULTIVITAMIN FOR WOMEN) TABS Take by mouth.    [provider]  naproxen (NAPROSYN) 500 MG tablet Take 1 tablet (500 mg total) by mouth 2 (two) times daily. Patient not taking: Reported on 11/24/2018 06/21/18   Fredia Sorrow, MD  promethazine (PHENERGAN) 25 MG tablet Take 1 tablet (25 mg total) by mouth every 6 (six) hours as needed for nausea or vomiting. Patient not taking: Reported on 11/24/2018 06/12/18   Margarita Mail, PA-C  spironolactone-hydrochlorothiazide (ALDACTAZIDE) 25-25 MG tablet Take 0.5 tablets by mouth daily. Patient not taking: Reported on 11/24/2018 02/06/18   Revankar, Reita Cliche, MD  telmisartan (MICARDIS) 40 MG tablet Take 1 tablet (40 mg total) by mouth daily. 11/24/18   Park Liter, MD  VITAMIN A OP Apply to eye.    [provider]  vitamin C (ASCORBIC ACID) 500 MG tablet Take 500 mg by mouth daily.    [provider]  VITAMIN D, CHOLECALCIFEROL, PO Take by mouth.    [provider]    Family History Family History  Problem Relation Age of Onset  . Breast cancer Mother   . Hypertension Mother   . Hyperlipidemia Mother   . Colon cancer Father   . Cancer Father   . Thyroid disease Sister   . Cancer Other   . Thyroid disease Sister   . Hypertension Maternal Aunt   . Heart failure Maternal Aunt   . Cancer Maternal Aunt   . Hypertension Maternal Aunt   . Hypertension Maternal Uncle   . Diabetes Maternal Uncle   . Heart failure Maternal Grandmother   . Cancer Maternal Grandfather        prostate    Social History Social History   Tobacco Use  . Smoking status: Never Smoker  . Smokeless tobacco: Never Used  Substance Use Topics  . Alcohol use: No  . Drug use: No     Allergies   Lisinopril; Lasix [furosemide]; Nitrofurantoin; Keflex [cephalexin]; and Latex   Review of Systems Review of Systems  Musculoskeletal: Positive for back pain.  All other systems reviewed and are negative.    Physical Exam Updated Vital Signs BP (!) 184/116 (BP Location: Left Arm) Comment: RN Rebekah informed and Pt did not want a wheel chair  Pulse 63   Temp 98 F (36.7 C) (Oral)   Resp 16   SpO2 100%   Physical Exam Vitals signs and nursing note reviewed.  Constitutional:      Comments: Uncomfortable   HENT:     Head: Normocephalic and atraumatic.     Mouth/Throat:     Mouth: Mucous membranes are moist.  Eyes:     Pupils: Pupils are equal, round, and reactive to light.  Neck:     Musculoskeletal: Normal range of motion.  Cardiovascular:     Rate and Rhythm: Normal rate and regular rhythm.  Pulmonary:     Effort: Pulmonary effort is normal.     Breath sounds: Normal breath sounds.  Abdominal:     General: Abdomen is flat.     Palpations: Abdomen is soft.  Musculoskeletal:     Comments: + lower lumbar tenderness, mostly in the paralumbar area. No saddle  anesthesia   Skin:    General: Skin is warm.     Capillary Refill: Capillary refill takes less than 2 seconds.  Neurological:     General: No focal deficit present.     Mental Status: She is alert  and oriented to person, place, and time.     Comments: CN 2- 12 intact. Nl strength and sensation throughout,  Psychiatric:        Mood and Affect: Mood normal.        Behavior: Behavior normal.      ED Treatments / Results  Labs (all labs ordered are listed, but only abnormal results are displayed) Labs Reviewed - No data to display  EKG None  Radiology No results found.  Procedures Procedures (including critical care time)  Medications Ordered in ED Medications  cyclobenzaprine (FLEXERIL) tablet 5 mg (has no administration in time range)  HYDROcodone-acetaminophen (NORCO/VICODIN) 5-325 MG per tablet 2 tablet (has no administration in time range)  hydrochlorothiazide (HYDRODIURIL) tablet 25 mg (has no administration in time range)     Initial Impression / Assessment and Plan / ED Course  I have reviewed the triage vital signs and the nursing notes.  Pertinent labs & imaging results that were available during my care of the patient were reviewed by me and considered in my medical decision making (see chart for details).        Destiny Henderson is a 58 y.o. female here with back pain, hypertension. Back pain after a fall and she is neurovascular intact. Patient was hypertensive several days ago and had unremarkable labs. I think hypertension today likely exacerbated by pain. Will get lumbar xrays. Will give pain meds, muscle relaxants. Will give extra dose of HCTZ.   8:00 PM Xray showed no fracture. UA nl. Patient's BP improved to 170s from 180s.  Patient is concerned about her blood pressure being uncontrolled.  He is on very low dose Micardis so will increase it. Will have her follow up with PCP.    Final Clinical Impressions(s) / ED Diagnoses   Final diagnoses:  None      ED Discharge Orders    None       Drenda Freeze, MD 11/28/18 2001

## 2018-11-28 NOTE — ED Notes (Signed)
Pt called out stating she was nauseated and wanted crackers. Went to room to check on pt. Pt states she is nauseated and offered to get pt nausea medication. Pt asking what she is waiting on. Advised pt that we would recheck her BP after the medication and MD would come in to discuss her xray results and I would ask for MD for nausea medication. Pt asks "is the doctor just sitting at his desk not doing anything?" Made pt aware the department was very busy and he would be in as soon as possible to discuss her results. MD made aware of pt being nauseated and her concerns.

## 2018-12-01 ENCOUNTER — Encounter (HOSPITAL_BASED_OUTPATIENT_CLINIC_OR_DEPARTMENT_OTHER): Payer: Self-pay | Admitting: Emergency Medicine

## 2018-12-01 ENCOUNTER — Emergency Department (HOSPITAL_BASED_OUTPATIENT_CLINIC_OR_DEPARTMENT_OTHER)
Admission: EM | Admit: 2018-12-01 | Discharge: 2018-12-01 | Disposition: A | Discharge status: Home / Self Care | Payer: 59 | Attending: Emergency Medicine | Admitting: Emergency Medicine

## 2018-12-01 ENCOUNTER — Other Ambulatory Visit: Payer: Self-pay

## 2018-12-01 DIAGNOSIS — I10 Essential (primary) hypertension: Secondary | ICD-10-CM | POA: Diagnosis not present

## 2018-12-01 DIAGNOSIS — Z79899 Other long term (current) drug therapy: Secondary | ICD-10-CM | POA: Insufficient documentation

## 2018-12-01 DIAGNOSIS — N72 Inflammatory disease of cervix uteri: Secondary | ICD-10-CM | POA: Diagnosis not present

## 2018-12-01 DIAGNOSIS — R102 Pelvic and perineal pain: Secondary | ICD-10-CM | POA: Diagnosis present

## 2018-12-01 LAB — URINE CULTURE: Culture: >=100000 — AB

## 2018-12-01 LAB — URINALYSIS, ROUTINE W REFLEX MICROSCOPIC
Bilirubin Urine: NEGATIVE
Glucose, UA: NEGATIVE mg/dL
Ketones, ur: NEGATIVE mg/dL
Nitrite: NEGATIVE
Protein, ur: 100 mg/dL — AB
SPECIFIC GRAVITY, URINE: 1.02 (ref 1.005–1.030)
pH: 7 (ref 5.0–8.0)

## 2018-12-01 LAB — WET PREP, GENITAL
Sperm: NONE SEEN
Trich, Wet Prep: NONE SEEN
Yeast Wet Prep HPF POC: NONE SEEN

## 2018-12-01 LAB — URINALYSIS, MICROSCOPIC (REFLEX): WBC, UA: >50 WBC/hpf (ref 0–5)

## 2018-12-01 LAB — PREGNANCY, URINE: PREG TEST UR: NEGATIVE

## 2018-12-01 MED ORDER — PHENAZOPYRIDINE HCL 100 MG PO TABS
200.0000 mg | ORAL_TABLET | Freq: Once | ORAL | Status: AC
  Administered 2018-12-01: 200 mg via ORAL
  Filled 2018-12-01: qty 2

## 2018-12-01 MED ORDER — DOXYCYCLINE HYCLATE 100 MG PO TABS
100.0000 mg | ORAL_TABLET | Freq: Once | ORAL | Status: DC
  Filled 2018-12-01: qty 1

## 2018-12-01 MED ORDER — CEFTRIAXONE SODIUM 250 MG IJ SOLR
250.0000 mg | Freq: Once | INTRAMUSCULAR | Status: AC
  Administered 2018-12-01: 250 mg via INTRAMUSCULAR
  Filled 2018-12-01: qty 250

## 2018-12-01 MED ORDER — DOXYCYCLINE HYCLATE 100 MG PO CAPS: 100.0000 mg | ORAL_CAPSULE | Freq: Two times a day (BID) | ORAL | 0 refills | Status: DC

## 2018-12-01 MED ORDER — AZITHROMYCIN 1 G PO PACK
1.0000 g | PACK | Freq: Once | ORAL | Status: AC
  Administered 2018-12-01: 1 g via ORAL
  Filled 2018-12-01: qty 1

## 2018-12-01 MED ORDER — ONDANSETRON 8 MG PO TBDP
8.0000 mg | ORAL_TABLET | Freq: Once | ORAL | Status: AC
  Administered 2018-12-01: 8 mg via ORAL
  Filled 2018-12-01: qty 1

## 2018-12-01 MED ORDER — NAPROXEN 375 MG PO TABS: 375.0000 mg | ORAL_TABLET | Freq: Two times a day (BID) | ORAL | 0 refills | Status: DC

## 2018-12-01 NOTE — ED Triage Notes (Signed)
Patient states that she has had lower pelvic pain since Saturday - the patient reports that she is having pressure when she urinated. Reports N/V

## 2018-12-01 NOTE — ED Notes (Signed)
Pt now requesting to have IM rocephin shot. MD made aware

## 2018-12-01 NOTE — ED Provider Notes (Signed)
[facility] EMERGENCY DEPARTMENT Provider Note   CSN: [id number] Arrival date & time: 12/01/18  0021    History   Chief Complaint Chief Complaint  Patient presents with  . Pelvic Pain    HPI Destiny Henderson is a 58 y.o. female.     The history is provided by the patient.  Pelvic Pain  This is a new problem. The current episode started more than 2 days ago. The problem occurs constantly. The problem has not changed since onset.Pertinent negatives include no chest pain, no abdominal pain, no headaches and no shortness of breath. Nothing aggravates the symptoms. Nothing relieves the symptoms. She has tried nothing for the symptoms. The treatment provided no relief.  Patient presents for pelvic pain, vaginal discharge and dysuria for the last 2 days.  Patient reports she spoke to husband in [place] who informed her he was reportedly having and affair and he now has so pelvic pain and is going to be checked for STDs.  No f/c/r.  Patient reports "creamy" colored discharge and "foul odor."     Past Medical History:  Diagnosis Date  . Anemia   . Benign tumor of bladder   . DVT (deep venous thrombosis) ([place])   . Essential hypertension, benign 03/03/2013  . Fibroma of heart   . Obesity 03/03/2013    Patient Active Problem List   Diagnosis Date Noted  . Dizziness 11/24/2018  . Low back pain radiating to right leg 02/17/2018  . Pedal edema 02/06/2018  . History of DVT (deep vein thrombosis) 02/06/2018  . Right wrist injury, subsequent encounter 01/24/2018  . Right knee injury, subsequent encounter 01/24/2018  . Sprain of MCL (medial collateral ligament) of knee 12/12/2016  . Sprain of right ankle 12/12/2016  . Menorrhagia 05/20/2013  . Essential hypertension, benign 03/03/2013  . Obesity 03/03/2013    Past Surgical History:  Procedure Laterality Date  . BACK SURGERY    . bladder tack    . DILATION AND CURETTAGE OF UTERUS     after SAB  . HEART TUMOR EXCISION        OB History    Gravida  4   Para  3   Term  3   Preterm      AB  1   Living  3     SAB  1   TAB      Ectopic      Multiple      Live Births               Home Medications    Prior to Admission medications   Medication Sig Start Date End Date Taking? Authorizing Provider  acetaminophen (TYLENOL) 500 MG tablet Take 1 tablet (500 mg total) by mouth every 6 (six) hours as needed. Patient not taking: Reported on 11/24/2018 01/21/18   Frederica Kuster, PA-C  b complex vitamins capsule Take 1 capsule by mouth daily.    [provider]  Coenzyme Q10 (CO Q 10 PO) Take by mouth.    [provider]  cyclobenzaprine (FLEXERIL) 5 MG tablet Take 1 tablet (5 mg total) by mouth 3 (three) times daily as needed for muscle spasms. 11/28/18   Drenda Freeze, MD  doxycycline (VIBRAMYCIN) 100 MG capsule Take 1 capsule (100 mg total) by mouth 2 (two) times daily. 12/01/18   Johaan Ryser, MD  ECHINACEA PO Take by mouth.    [provider]  fish oil-omega-3 fatty acids 1000 MG capsule  Take 2 g by mouth daily.    [provider]  FLAXSEED, LINSEED, PO Take by mouth.    [provider]  Melatonin 5 MG CAPS Take by mouth as needed.     [provider]  Multiple Vitamins-Minerals (MEGA MULTIVITAMIN FOR WOMEN) TABS Take by mouth.    [provider]  naproxen (NAPROSYN) 375 MG tablet Take 1 tablet (375 mg total) by mouth 2 (two) times daily. 12/01/18   Rebel Willcutt, MD  naproxen (NAPROSYN) 500 MG tablet Take 1 tablet (500 mg total) by mouth 2 (two) times daily. Patient not taking: Reported on 11/24/2018 06/21/18   Fredia Sorrow, MD  promethazine (PHENERGAN) 25 MG tablet Take 1 tablet (25 mg total) by mouth every 6 (six) hours as needed for nausea or vomiting. Patient not taking: Reported on 11/24/2018 06/12/18   Margarita Mail, PA-C  spironolactone-hydrochlorothiazide (ALDACTAZIDE) 25-25 MG tablet Take 0.5 tablets by mouth  daily. Patient not taking: Reported on 11/24/2018 02/06/18   Revankar, Reita Cliche, MD  telmisartan (MICARDIS) 40 MG tablet Take 1 tablet (40 mg total) by mouth daily. 11/24/18   Park Liter, MD  telmisartan-hydrochlorothiazide (MICARDIS HCT) 80-25 MG tablet Take 1 tablet by mouth daily. 11/28/18   Drenda Freeze, MD  traMADol (ULTRAM) 50 MG tablet Take 1 tablet (50 mg total) by mouth every 6 (six) hours as needed. 11/28/18   Drenda Freeze, MD  VITAMIN A OP Apply to eye.    [provider]  vitamin C (ASCORBIC ACID) 500 MG tablet Take 500 mg by mouth daily.    [provider]  VITAMIN D, CHOLECALCIFEROL, PO Take by mouth.    [provider]    Family History Family History  Problem Relation Age of Onset  . Breast cancer Mother   . Hypertension Mother   . Hyperlipidemia Mother   . Colon cancer Father   . Cancer Father   . Thyroid disease Sister   . Cancer Other   . Thyroid disease Sister   . Hypertension Maternal Aunt   . Heart failure Maternal Aunt   . Cancer Maternal Aunt   . Hypertension Maternal Aunt   . Hypertension Maternal Uncle   . Diabetes Maternal Uncle   . Heart failure Maternal Grandmother   . Cancer Maternal Grandfather        prostate    Social History Social History   Tobacco Use  . Smoking status: Never Smoker  . Smokeless tobacco: Never Used  Substance Use Topics  . Alcohol use: No  . Drug use: No     Allergies   Lisinopril; Lasix [furosemide]; Nitrofurantoin; and Latex   Review of Systems Review of Systems  Constitutional: Negative for fever.  Respiratory: Negative for shortness of breath.   Cardiovascular: Negative for chest pain.  Gastrointestinal: Negative for abdominal pain, nausea and vomiting.  Genitourinary: Positive for dysuria, pelvic pain and vaginal discharge. Negative for flank pain and vaginal bleeding.  Neurological: Negative for headaches.  All other systems reviewed and are  negative.    Physical Exam Updated Vital Signs BP (!) 160/96 (BP Location: Right Arm)   Pulse 70   Temp 98.5 F (36.9 C) (Oral)   Resp 16   Ht 5\' 4"  (1.626 m)   Wt 98.9 kg   SpO2 99%   BMI 37.43 kg/m   Physical Exam Vitals signs and nursing note reviewed. Exam conducted with a chaperone present.  Constitutional:      General: She is  not in acute distress. HENT:     Head: Normocephalic and atraumatic.     Nose: Nose normal.     Mouth/Throat:     Mouth: Mucous membranes are moist.     Pharynx: Oropharynx is clear.  Eyes:     Conjunctiva/sclera: Conjunctivae normal.     Pupils: Pupils are equal, round, and reactive to light.  Neck:     Musculoskeletal: Normal range of motion and neck supple.  Cardiovascular:     Rate and Rhythm: Normal rate and regular rhythm.     Pulses: Normal pulses.     Heart sounds: Normal heart sounds.  Pulmonary:     Effort: Pulmonary effort is normal.     Breath sounds: Normal breath sounds.  Abdominal:     General: Abdomen is flat. Bowel sounds are normal.     Tenderness: There is no abdominal tenderness. There is no guarding or rebound.     Hernia: No hernia is present.  Genitourinary:    Vagina: Vaginal discharge present.     Cervix: No friability or cervical bleeding.     Adnexa:        Right: No mass or tenderness.         Left: No mass or tenderness.       Comments: No CMT, scant Dilauro discharge Neurological:     Mental Status: She is alert.      ED Treatments / Results  Labs (all labs ordered are listed, but only abnormal results are displayed) Results for orders placed or performed during the hospital encounter of 12/01/18  Wet prep, genital  Result Value Ref Range   Yeast Wet Prep HPF POC NONE SEEN NONE SEEN   Trich, Wet Prep NONE SEEN NONE SEEN   Clue Cells Wet Prep HPF POC PRESENT (A) NONE SEEN   WBC, Wet Prep HPF POC MANY (A) NONE SEEN   Sperm NONE SEEN   Urinalysis, Routine w reflex microscopic  Result Value Ref  Range   Color, Urine YELLOW YELLOW   APPearance CLOUDY (A) CLEAR   Specific Gravity, Urine 1.020 1.005 - 1.030   pH 7.0 5.0 - 8.0   Glucose, UA NEGATIVE NEGATIVE mg/dL   Hgb urine dipstick LARGE (A) NEGATIVE   Bilirubin Urine NEGATIVE NEGATIVE   Ketones, ur NEGATIVE NEGATIVE mg/dL   Protein, ur 100 (A) NEGATIVE mg/dL   Nitrite NEGATIVE NEGATIVE   Leukocytes,Ua SMALL (A) NEGATIVE  Pregnancy, urine  Result Value Ref Range   Preg Test, Ur NEGATIVE NEGATIVE  Urinalysis, Microscopic (reflex)  Result Value Ref Range   RBC / HPF 21-50 0 - 5 RBC/hpf   WBC, UA >50 0 - 5 WBC/hpf   Bacteria, UA MANY (A) NONE SEEN   Squamous Epithelial / LPF 11-20 0 - 5   Dg Chest 2 View  Result Date: 11/25/2018 CLINICAL DATA:  58 year old female with dizziness and high blood pressure EXAM: CHEST - 2 VIEW COMPARISON:  05/11/2018 FINDINGS: Cardiomediastinal silhouette unchanged in size and contour. Surgical changes of median sternotomy. No pneumothorax. No pleural effusion. Coarsened interstitial markings similar to prior. No confluent airspace disease. No displaced fracture. IMPRESSION: Chronic lung changes without evidence of superimposed acute cardiopulmonary disease Electronically Signed   By: Corrie Mckusick D.O.   On: 11/25/2018 10:38   Dg Lumbar Spine Complete  Result Date: 11/28/2018 CLINICAL DATA:  Fall.  Back pain EXAM: LUMBAR SPINE - COMPLETE 4+ VIEW COMPARISON:  06/21/2018 FINDINGS: Mild retrolisthesis L4-5 with disc and facet degeneration at  L4-5. Remaining alignment normal. Negative for fracture or pars defect. IUD in the pelvis. IMPRESSION: Negative for fracture. Electronically Signed   By: Franchot Gallo M.D.   On: 11/28/2018 19:16   Ct Head Wo Contrast  Result Date: 11/25/2018 CLINICAL DATA:  Morning headaches. Unsteady gait and dizziness. Elevated blood pressure. EXAM: CT HEAD WITHOUT CONTRAST TECHNIQUE: Contiguous axial images were obtained from the base of the skull through the vertex without  intravenous contrast. COMPARISON:  05/11/2018 FINDINGS: Brain: No evidence of acute infarction, hemorrhage, hydrocephalus, extra-axial collection or mass lesion/mass effect. Vascular: No hyperdense vessel or unexpected calcification. Skull: Normal. Negative for fracture or focal lesion. Sinuses/Orbits: No acute finding. Other: None IMPRESSION: 1. No acute intracranial abnormality.  Normal brain. Electronically Signed   By: Kerby Moors M.D.   On: 11/25/2018 11:54    Radiology No results found.  Procedures Procedures (including critical care time)  Medications Ordered in ED Medications  doxycycline (VIBRA-TABS) tablet 100 mg (100 mg Oral Not Given 12/01/18 0232)  ondansetron (ZOFRAN-ODT) disintegrating tablet 8 mg (8 mg Oral Given 12/01/18 0144)  azithromycin (ZITHROMAX) powder 1 g (1 g Oral Given 12/01/18 0232)  phenazopyridine (PYRIDIUM) tablet 200 mg (200 mg Oral Given 12/01/18 0232)  cefTRIAXone (ROCEPHIN) injection 250 mg (250 mg Intramuscular Given 12/01/18 0245)     Patient denied antibiotic allergies, but Epic reports keflex allergy.  EDP then ordered doxycycline. Nurse reports patient is now denying keflex allergy.  Rocephin ordered.  Will observe to ensure no signs of allergic reaction.     Final Clinical Impressions(s) / ED Diagnoses   Final diagnoses:  Cervicitis   Return for pain, intractable cough, fevers >100.4 unrelieved by medication, shortness of breath, intractable vomiting, chest pain, shortness of breath, weakness numbness, changes in speech, facial asymmetry,abdominal pain, passing out,Inability to tolerate liquids or food, cough, altered mental status or any concerns. No signs of systemic illness or infection. The patient is nontoxic-appearing on exam and vital signs are within normal limits.   I have reviewed the triage vital signs and the nursing notes. Pertinent labs &imaging results that were available during my care of the patient were reviewed by me and  considered in my medical decision making (see chart for details).  After history, exam, and medical workup I feel the patient has been appropriately medically screened and is safe for discharge home. Pertinent diagnoses were discussed with the patient. Patient was given return precautions.  ED Discharge Orders         Ordered    doxycycline (VIBRAMYCIN) 100 MG capsule  2 times daily     12/01/18 0216    naproxen (NAPROSYN) 375 MG tablet  2 times daily     12/01/18 0216           Jennifr Gaeta, MD 12/01/18 [id number]

## 2018-12-01 NOTE — ED Notes (Signed)
Pt actively vomiting, MD made aware. New orders received.

## 2018-12-01 NOTE — ED Notes (Signed)
ED Provider at bedside. 

## 2018-12-02 ENCOUNTER — Telehealth: Payer: Self-pay

## 2018-12-02 ENCOUNTER — Telehealth: Payer: Self-pay | Admitting: Emergency Medicine

## 2018-12-02 ENCOUNTER — Ambulatory Visit (HOSPITAL_BASED_OUTPATIENT_CLINIC_OR_DEPARTMENT_OTHER): Payer: 59

## 2018-12-02 LAB — CERVICOVAGINAL ANCILLARY ONLY
Chlamydia: NEGATIVE
Neisseria Gonorrhea: NEGATIVE

## 2018-12-02 LAB — HIV ANTIBODY (ROUTINE TESTING W REFLEX): HIV Screen 4th Generation wRfx: NONREACTIVE

## 2018-12-02 NOTE — Progress Notes (Signed)
ED Antimicrobial Stewardship Positive Culture Follow Up   Destiny Henderson is an 58 y.o. female who presented to [facility] on 12/01/2018 with a chief complaint of  Chief Complaint  Patient presents with  . Pelvic Pain    Recent Results (from the past 720 hour(s))  Urine culture     Status: Abnormal   Collection Time: 11/28/18  6:48 PM  Result Value Ref Range Status   Specimen Description   Final    URINE, RANDOM Performed at [facility], [address] [place], [place] [address]    Special Requests   Final    NONE Performed at [facility], [address] [place], [place] [address]    Culture >=100,000 COLONIES/mL ENTEROCOCCUS FAECALIS (A)  Final   Report Status 12/01/2018 FINAL  Final   Organism ID, Bacteria ENTEROCOCCUS FAECALIS (A)  Final      Susceptibility   Enterococcus faecalis - MIC*    AMPICILLIN <=2 SENSITIVE Sensitive     LEVOFLOXACIN 0.5 SENSITIVE Sensitive     NITROFURANTOIN <=16 SENSITIVE Sensitive     VANCOMYCIN 2 SENSITIVE Sensitive     * >=100,000 COLONIES/mL ENTEROCOCCUS FAECALIS  Wet prep, genital     Status: Abnormal   Collection Time: 12/01/18  1:51 AM  Result Value Ref Range Status   Yeast Wet Prep HPF POC NONE SEEN NONE SEEN Final   Trich, Wet Prep NONE SEEN NONE SEEN Final   Clue Cells Wet Prep HPF POC PRESENT (A) NONE SEEN Final   WBC, Wet Prep HPF POC MANY (A) NONE SEEN Final   Sperm NONE SEEN  Final    Comment: Performed at [facility], [address] [place], [place] [address]   [x]  Patient discharged originally without antimicrobial agent and treatment is now indicated  New antibiotic prescription: Continue doxycycline with labs still pending. Also start amoxicillin 875mg  PO Q12H x 5 days.  ED Provider: Lenn Sink, PA-C   [name] 12/02/2018, 9:31 AM Clinical Pharmacist Monday - Friday phone -  [phone] Saturday - Sunday phone - [phone]

## 2018-12-02 NOTE — Telephone Encounter (Signed)
Post ED Visit - Positive Culture Follow-up: Successful Patient Follow-Up  Culture assessed and recommendations reviewed by:  []  Elenor Quinones, Pharm.D. []  Heide Guile, Pharm.D., BCPS AQ-ID []  Parks Neptune, Pharm.D., BCPS []  Alycia Rossetti, Pharm.D., BCPS []  [place], Pharm.D., BCPS, AAHIVP []  Legrand Como, Pharm.D., BCPS, AAHIVP []  Salome Arnt, PharmD, BCPS []  Johnnette Gourd, PharmD, BCPS []  Hughes Better, PharmD, BCPS []  Leeroy Cha, PharmD Tama Headings Pharm D Positive urine culture  [x]  Patient discharged without antimicrobial prescription and treatment is now indicated []  Organism is resistant to prescribed ED discharge antimicrobial []  Patient with positive blood cultures  Changes discussed with ED provider: Okey Regal PA New antibiotic prescription amoxicillin 875 mg  q12h x 5 days Called to [facility] patient [facility]  Contacted patient, date 12/02/2018, time 1639   Genia Del 12/02/2018, 4:37 PM

## 2018-12-02 NOTE — Telephone Encounter (Signed)
Post ED Visit - Positive Culture Follow-up: Successful Patient Follow-Up  Culture assessed and recommendations reviewed by:  []  Elenor Quinones, Pharm.D. []  Heide Guile, Pharm.D., BCPS AQ-ID []  Parks Neptune, Pharm.D., BCPS []  Alycia Rossetti, Pharm.D., BCPS []  [place], Pharm.D., BCPS, AAHIVP []  Legrand Como, Pharm.D., BCPS, AAHIVP []  Salome Arnt, PharmD, BCPS []  Johnnette Gourd, PharmD, BCPS []  Hughes Better, PharmD, BCPS []  Leeroy Cha, PharmD Elicia Lamp PharmD  Positive urine culture  [x]  Patient discharged without antimicrobial prescription and treatment is now indicated []  Organism is resistant to prescribed ED discharge antimicrobial []  Patient with positive blood cultures  Changes discussed with ED provider: Lenn Sink PA New antibiotic prescription continue doxy, start amoxicillin 875mg  po q 12hours x 5 days  Attempting to contact patient    Hazle Nordmann 12/02/2018, 10:12 AM

## 2018-12-03 ENCOUNTER — Other Ambulatory Visit (HOSPITAL_COMMUNITY): Payer: 59

## 2018-12-05 ENCOUNTER — Ambulatory Visit (INDEPENDENT_AMBULATORY_CARE_PROVIDER_SITE_OTHER): Payer: 59 | Admitting: Cardiology

## 2018-12-05 ENCOUNTER — Encounter: Payer: Self-pay | Admitting: Cardiology

## 2018-12-05 ENCOUNTER — Encounter: Payer: Self-pay | Admitting: Emergency Medicine

## 2018-12-05 VITALS — BP 160/100 | HR 113 | Ht 64.0 in | Wt 222.6 lb

## 2018-12-05 DIAGNOSIS — R600 Localized edema: Secondary | ICD-10-CM

## 2018-12-05 DIAGNOSIS — R42 Dizziness and giddiness: Secondary | ICD-10-CM

## 2018-12-05 DIAGNOSIS — R0789 Other chest pain: Secondary | ICD-10-CM

## 2018-12-05 DIAGNOSIS — I10 Essential (primary) hypertension: Secondary | ICD-10-CM
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
## 2018-12-05 NOTE — Patient Instructions (Signed)
Medication Instructions:  Your physician recommends that you continue on your current medications as directed. Please refer to the Current Medication list given to you today.  If you need a refill on your cardiac medications before your next appointment, please call your pharmacy.   Lab work: Your physician recommends that you return for lab work today: bmp  If you have labs (blood work) drawn today and your tests are completely normal, you will receive your results only by: Marland Kitchen MyChart Message (if you have MyChart) OR . A paper copy in the mail If you have any lab test that is abnormal or we need to change your treatment, we will call you to review the results.  Testing/Procedures: None.  Follow-Up:  Follow up as previously advised    Any Other Special Instructions Will Be Listed Below (If Applicable).

## 2018-12-06 LAB — BASIC METABOLIC PANEL
BUN / CREAT RATIO: 11 (ref 9–23)
BUN: 11 mg/dL (ref 6–24)
CO2: 24 mmol/L (ref 20–29)
Calcium: 9.4 mg/dL (ref 8.7–10.2)
Chloride: 105 mmol/L (ref 96–106)
Creatinine, Ser: 1 mg/dL (ref 0.57–1.00)
GFR calc Af Amer: 73 mL/min/{1.73_m2} (ref >59)
GFR calc non Af Amer: 63 mL/min/{1.73_m2} (ref >59)
Glucose: 73 mg/dL (ref 65–99)
Potassium: 4.7 mmol/L (ref 3.5–5.2)
Sodium: 142 mmol/L (ref 134–144)

## 2018-12-08 ENCOUNTER — Telehealth: Payer: Self-pay | Admitting: Emergency Medicine

## 2018-12-08 NOTE — Telephone Encounter (Signed)
Left message for patient to return call regarding lab results and regarding my chart message.

## 2018-12-09 ENCOUNTER — Telehealth: Payer: Self-pay

## 2018-12-09 MED ORDER — DILTIAZEM HCL ER 120 MG PO CP24: 120.0000 mg | ORAL_CAPSULE | Freq: Every day | ORAL | 1 refills | Status: DC

## 2018-12-09 NOTE — Telephone Encounter (Signed)
Per the Request of Dr. Agustin Cree, I contacted the patient's pharmacy to see if she had in fact picked up her most recent prescription that had been called in by Dr. Agustin Cree this past Saturday. They advised that she had not picked it up. I have advised Dr. Agustin Cree. The patient is currently requesting to be seen today due to her blood pressure, with multiple issues which include passing out. She will be contacted to schedule an appointment.

## 2018-12-09 NOTE — Telephone Encounter (Signed)
I left another message for the patient to return the phone call at 8:35 am today, and sent a my chart message asking her to return the call as well.

## 2018-12-10 MED ORDER — DILTIAZEM HCL ER 120 MG PO CP24: 120.0000 mg | ORAL_CAPSULE | Freq: Every day | ORAL | 1 refills | Status: DC

## 2018-12-10 NOTE — Addendum Note (Signed)
Addended by: Ashok Norris on: 12/10/2018 10:09 AM   Modules accepted: Orders

## 2018-12-10 NOTE — Telephone Encounter (Signed)
Called patient back. Informed her of her lab results and followed up to see if she received Dr. Wendy Poet message over the weekend to start diltiazem 120 mg daily. She reports she did get this message, however she hasn't picked up the medication yet. I informed her that he is advising her to start this. She verbally understood she reports she will pick medication up today. I have sent prescription of diltiazem 120 mg daily to the outpatient pharmacy at [facility] [place] per patient request. During the call I also informed her that per Dr. Agustin Cree she will need to take any disability paperwork to her primary care provider. She verbally understands. She reports she would like an appointment due to her blood pressure being 190/180 today having a headache, off balance, and she reports being nauseated as well. She asked where Dr. Agustin Cree was today I informed her he is seeing patient's in [place] today, then she asks to see Dr. Geraldo Pitter in [facility] today since she saw him in the beginning anyways. I asked her more about how she was feeling and if she had taken her blood pressure medication today, telmisartan-hydrochlorothiazide 80-25 mg today she reports that she did and the blood pressure she gave me was before taking this. I asked her if she could take the blood pressure again for me since it is after she has taken the medication and she reports she will once she gets an appointment. I then went on to explain that I will need another blood pressure as she may need to go to the emergency department with these symptoms and if her blood pressure is still high, I began to advise that I would speak to Dr. Geraldo Pitter and confirm the plan, she then said I will go to the emergency department and hung up the phone. Dr. Geraldo Pitter aware.

## 2018-12-11 ENCOUNTER — Telehealth: Payer: Self-pay

## 2018-12-11 ENCOUNTER — Ambulatory Visit (HOSPITAL_BASED_OUTPATIENT_CLINIC_OR_DEPARTMENT_OTHER)
Admission: RE | Admit: 2018-12-11 | Discharge: 2018-12-11 | Disposition: A | Discharge status: Home / Self Care | Payer: 59 | Source: Ambulatory Visit | Attending: Cardiology | Admitting: Cardiology

## 2018-12-11 DIAGNOSIS — R42 Dizziness and giddiness: Secondary | ICD-10-CM | POA: Insufficient documentation

## 2018-12-11 DIAGNOSIS — R600 Localized edema: Secondary | ICD-10-CM | POA: Insufficient documentation

## 2018-12-11 DIAGNOSIS — I10 Essential (primary) hypertension: Secondary | ICD-10-CM | POA: Diagnosis present

## 2018-12-11 NOTE — Telephone Encounter (Signed)
Discussed with Dr. Agustin Cree that the patient was contacted by Linna Hoff, RN on 12/10/2018. The patient had advised that she did get Dr. Wendy Poet message about starting diltiazem. She stated per Hayley's phone note on 12/08/2018, that she had planned to pick it up that day, 12/10/2018. Dr. Agustin Cree had me once again check with her pharmacy to see if she had picked up her diltiazem. Contacted the [facility] listed in her chart and verified that she had not picked up her medication there or at any other [facility].

## 2018-12-11 NOTE — Progress Notes (Signed)
  Echocardiogram 2D Echocardiogram has been performed.  Baruch Lewers T Latishia Suitt 12/11/2018, 1:53 PM

## 2018-12-17 ENCOUNTER — Telehealth: Payer: Self-pay | Admitting: Cardiology

## 2018-12-17 NOTE — Telephone Encounter (Signed)
Patient states she is returning a call. 

## 2018-12-17 NOTE — Telephone Encounter (Signed)
Left message informing patient that we haven't called however if she has any other issues to please call back.
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
## 2018-12-22 ENCOUNTER — Encounter: Payer: Self-pay | Admitting: Cardiology

## 2018-12-22 ENCOUNTER — Other Ambulatory Visit: Payer: Self-pay

## 2018-12-22 ENCOUNTER — Ambulatory Visit (INDEPENDENT_AMBULATORY_CARE_PROVIDER_SITE_OTHER): Payer: 59 | Admitting: Cardiology

## 2018-12-22 VITALS — BP 170/110 | HR 68 | Ht 64.0 in | Wt 220.8 lb

## 2018-12-22 DIAGNOSIS — R42 Dizziness and giddiness: Secondary | ICD-10-CM

## 2018-12-22 DIAGNOSIS — R0789 Other chest pain: Secondary | ICD-10-CM

## 2018-12-22 DIAGNOSIS — I10 Essential (primary) hypertension: Secondary | ICD-10-CM | POA: Diagnosis not present

## 2018-12-22 MED ORDER — DILTIAZEM HCL ER 240 MG PO CP24: 240.0000 mg | ORAL_CAPSULE | Freq: Every day | ORAL | 1 refills | Status: DC

## 2018-12-22 NOTE — Patient Instructions (Signed)
Medication Instructions:   Increase Diltiazem dose to 240 mg by mouth once daily  If you need a refill on your cardiac medications before your next appointment, please call your pharmacy.   Lab work:  None    If you have labs (blood work) drawn today and your tests are completely normal, you will receive your results only by: Marland Kitchen MyChart Message (if you have MyChart) OR . A paper copy in the mail If you have any lab test that is abnormal or we need to change your treatment, we will call you to review the results.  Testing/Procedures:  None  Follow-Up: At [facility], you and your health needs are our priority.  As part of our continuing mission to provide you with exceptional heart care, we have created designated Provider Care Teams.  These Care Teams include your primary Cardiologist (physician) and Advanced Practice Providers (APPs -  Physician Assistants and Nurse Practitioners) who all work together to provide you with the care you need, when you need it.  . You will need a follow up appointment in 1 months.

## 2018-12-22 NOTE — Progress Notes (Signed)
Cardiology Office Note:    Date:  12/22/2018   ID:  Destiny Henderson, DOB Oct 19, 1960, MRN [id number]  PCP:  Lorenda Hatchet, FNP  Cardiologist:  Jenne Campus, MD    Referring MD: Lorenda Hatchet, FNP   Chief Complaint  Patient presents with  . 1 month follow up  Better  History of Present Illness:    Destiny Henderson is a 58 y.o. female with hypertension which appears to be difficult to control.  Slowly gradually making some progress.  She comes today to my office for follow-up she described to have some chest pain however pain is reproducible by pressing chest wall her EKG does not have any acute changes.  This is the pain she had previously as well.  Her blood pressure is still elevated.  Luckily it looks like she is taking all her medications I asked her to double the dose of Cardizem.  She takes 120 going up to 40.  EKG showed normal P interval today.  Past Medical History:  Diagnosis Date  . Anemia   . Benign tumor of bladder   . DVT (deep venous thrombosis) ([place])   . Essential hypertension, benign 03/03/2013  . Fibroma of heart   . Obesity 03/03/2013    Past Surgical History:  Procedure Laterality Date  . BACK SURGERY    . bladder tack    . DILATION AND CURETTAGE OF UTERUS     after SAB  . HEART TUMOR EXCISION      Current Medications: Current Meds  Medication Sig  . acetaminophen (TYLENOL) 500 MG tablet Take 1 tablet (500 mg total) by mouth every 6 (six) hours as needed.  Marland Kitchen b complex vitamins capsule Take 1 capsule by mouth daily.  . Coenzyme Q10 (CO Q 10 PO) Take by mouth.  . diltiazem (DILACOR XR) 240 MG 24 hr capsule Take 1 capsule (240 mg total) by mouth daily.  Marland Kitchen doxycycline (VIBRAMYCIN) 100 MG capsule Take 1 capsule (100 mg total) by mouth 2 (two) times daily.  Marland Kitchen ECHINACEA PO Take by mouth.  . fish oil-omega-3 fatty acids 1000 MG capsule Take 2 g by mouth daily.  Marland Kitchen FLAXSEED, LINSEED, PO Take by mouth.  . Melatonin 5 MG CAPS Take by mouth as  needed.   . Multiple Vitamins-Minerals (MEGA MULTIVITAMIN FOR WOMEN) TABS Take by mouth.  . promethazine (PHENERGAN) 25 MG tablet Take 1 tablet (25 mg total) by mouth every 6 (six) hours as needed for nausea or vomiting.  . Pyridoxine HCl (VITAMIN B-6) 500 MG tablet Take 500 mg by mouth daily.  Marland Kitchen telmisartan-hydrochlorothiazide (MICARDIS HCT) 80-25 MG tablet Take 1 tablet by mouth daily.  Marland Kitchen VITAMIN A OP Apply to eye.  . vitamin B-12 (CYANOCOBALAMIN) 1000 MCG tablet Take 1,000 mcg by mouth daily.  . vitamin C (ASCORBIC ACID) 500 MG tablet Take 500 mg by mouth daily.  Marland Kitchen VITAMIN D, CHOLECALCIFEROL, PO Take by mouth.  . [DISCONTINUED] diltiazem (DILACOR XR) 120 MG 24 hr capsule Take 1 capsule (120 mg total) by mouth daily.     Allergies:   Lisinopril; Lasix [furosemide]; Latex; and Prednisone   Social History   Socioeconomic History  . Marital status: Married    Spouse name: Not on file  . Number of children: Not on file  . Years of education: Not on file  . Highest education level: Not on file  Occupational History  . Occupation: Network engineer  Social Needs  . Financial resource strain: Not on file  .  Food insecurity:    Worry: Not on file    Inability: Not on file  . Transportation needs:    Medical: Not on file    Non-medical: Not on file  Tobacco Use  . Smoking status: Never Smoker  . Smokeless tobacco: Never Used  Substance and Sexual Activity  . Alcohol use: No  . Drug use: No  . Sexual activity: Not on file  Lifestyle  . Physical activity:    Days per week: Not on file    Minutes per session: Not on file  . Stress: Not on file  Relationships  . Social connections:    Talks on phone: Not on file    Gets together: Not on file    Attends religious service: Not on file    Active member of club or organization: Not on file    Attends meetings of clubs or organizations: Not on file    Relationship status: Not on file  Other Topics Concern  . Not on file  Social History  Narrative  . Not on file     Family History: The patient's family history includes Breast cancer in her mother; Cancer in her father, maternal aunt, maternal grandfather, and another family member; Colon cancer in her father; Diabetes in her maternal uncle; Heart failure in her maternal aunt and maternal grandmother; Hyperlipidemia in her mother; Hypertension in her maternal aunt, maternal aunt, maternal uncle, and mother; Thyroid disease in her sister and sister. ROS:   Please see the history of present illness.    All 14 point review of systems negative except as described per history of present illness  EKGs/Labs/Other Studies Reviewed:    Normal sinus rhythm normal P interval left ventricle hypertrophy with nonspecific ST segment changes  Recent Labs: 10/18/2018: ALT 17 11/25/2018: Hemoglobin 13.0; Platelets 357 12/05/2018: BUN 11; Creatinine, Ser 1.00; Potassium 4.7; Sodium 142  Recent Lipid Panel No results found for: CHOL, TRIG, HDL, CHOLHDL, VLDL, LDLCALC, LDLDIRECT  Physical Exam:    VS:  BP (!) 170/110   Pulse 68   Ht 5\' 4"  (1.626 m)   Wt 220 lb 12.8 oz (100.2 kg)   SpO2 97%   BMI 37.90 kg/m     Wt Readings from Last 3 Encounters:  12/22/18 220 lb 12.8 oz (100.2 kg)  12/05/18 222 lb 9.6 oz (101 kg)  12/01/18 218 lb 0.6 oz (98.9 kg)     GEN:  Well nourished, well developed in no acute distress HEENT: Normal NECK: No JVD; No carotid bruits LYMPHATICS: No lymphadenopathy CARDIAC: RRR, no murmurs, no rubs, no gallops RESPIRATORY:  Clear to auscultation without rales, wheezing or rhonchi  ABDOMEN: Soft, non-tender, non-distended MUSCULOSKELETAL:  No edema; No deformity  SKIN: Warm and dry LOWER EXTREMITIES: no swelling NEUROLOGIC:  Alert and oriented x 3 PSYCHIATRIC:  Normal affect   ASSESSMENT:    1. Atypical chest pain   2. Essential hypertension, benign   3. Dizziness    PLAN:    In order of problems listed above:  1. Atypical chest pain.  EKG no acute  new changes.  Pain reproducible by pressing chest wall. 2. Essential hypertension still elevated I will double the dose of calcium channel blocker. 3. Dizziness only when her blood pressure is high.  Seems to be doing better from that aspect.   Medication Adjustments/Labs and Tests Ordered: Current medicines are reviewed at length with the patient today.  Concerns regarding medicines are outlined above.  Orders Placed This Encounter  Procedures  . EKG 12-Lead   Medication changes:  Meds ordered this encounter  Medications  . diltiazem (DILACOR XR) 240 MG 24 hr capsule    Sig: Take 1 capsule (240 mg total) by mouth daily.    Dispense:  30 capsule    Refill:  1    Signed, Park Liter, MD, [facility] 12/22/2018 12:14 PM    [place]

## 2018-12-24 MED FILL — AMOXICILLIN 875 MG TABS: 875 | 5 days supply | Qty: 10 | Fill #0

## 2018-12-25 ENCOUNTER — Encounter (HOSPITAL_BASED_OUTPATIENT_CLINIC_OR_DEPARTMENT_OTHER): Payer: Self-pay | Admitting: *Deleted

## 2018-12-25 ENCOUNTER — Other Ambulatory Visit: Payer: Self-pay

## 2018-12-25 ENCOUNTER — Emergency Department (HOSPITAL_BASED_OUTPATIENT_CLINIC_OR_DEPARTMENT_OTHER)
Admission: EM | Admit: 2018-12-25 | Discharge: 2018-12-25 | Disposition: A | Discharge status: Home / Self Care | Payer: 59 | Attending: Emergency Medicine | Admitting: Emergency Medicine

## 2018-12-25 DIAGNOSIS — B349 Viral infection, unspecified: Secondary | ICD-10-CM | POA: Diagnosis not present

## 2018-12-25 DIAGNOSIS — I10 Essential (primary) hypertension: Secondary | ICD-10-CM | POA: Insufficient documentation

## 2018-12-25 DIAGNOSIS — R05 Cough: Secondary | ICD-10-CM | POA: Diagnosis present

## 2018-12-25 LAB — INFLUENZA PANEL BY PCR (TYPE A & B)
Influenza A By PCR: NEGATIVE
Influenza B By PCR: NEGATIVE

## 2018-12-25 MED ORDER — OSELTAMIVIR PHOSPHATE 75 MG PO CAPS: 75.0000 mg | ORAL_CAPSULE | Freq: Two times a day (BID) | ORAL | 0 refills | Status: DC

## 2018-12-25 MED ORDER — HYDROXYZINE HCL 25 MG PO TABS: 25.0000 mg | ORAL_TABLET | Freq: Four times a day (QID) | ORAL | 0 refills | Status: DC | PRN

## 2018-12-25 NOTE — ED Provider Notes (Signed)
[facility] Emergency Department Provider Note MRN:  [id number]  Arrival date & time: 12/25/18     Chief Complaint   Fever; Cough; and Nasal Congestion   History of Present Illness   Patience Destiny Henderson is a 58 y.o. year-old female with a history of DVT presenting to the ED with chief complaint of fever, cough.  3 days ago patient began feeling general malaise and fatigue.  2 days ago patient began experiencing dry cough.  Last night and today patient is experiencing fever, continued cough, nasal congestion, mild sore throat.  Continued generalized malaise.  Denies chest pain or shortness of breath, no leg pain or swelling, no abdominal pain, no dysuria, no rash, no headache, no vision change, no neck pain.  Patient normally lives in [place], here caring for her mother.  Last travel to [place] 2 weeks ago.  Denies any exposure to anyone with cold-like symptoms, denies exposure to anyone with known coronavirus.  Review of Systems  A complete 10 system review of systems was obtained and all systems are negative except as noted in the HPI and PMH.   Patient's Health History    Past Medical History:  Diagnosis Date  . Anemia   . Benign tumor of bladder   . DVT (deep venous thrombosis) ([place])   . Essential hypertension, benign 03/03/2013  . Fibroma of heart   . Obesity 03/03/2013    Past Surgical History:  Procedure Laterality Date  . BACK SURGERY    . bladder tack    . DILATION AND CURETTAGE OF UTERUS     after SAB  . HEART TUMOR EXCISION      Family History  Problem Relation Age of Onset  . Breast cancer Mother   . Hypertension Mother   . Hyperlipidemia Mother   . Colon cancer Father   . Cancer Father   . Thyroid disease Sister   . Cancer Other   . Thyroid disease Sister   . Hypertension Maternal Aunt   . Heart failure Maternal Aunt   . Cancer Maternal Aunt   . Hypertension Maternal Aunt   . Hypertension Maternal Uncle   . Diabetes Maternal Uncle    . Heart failure Maternal Grandmother   . Cancer Maternal Grandfather        prostate    Social History   Socioeconomic History  . Marital status: Married    Spouse name: Not on file  . Number of children: Not on file  . Years of education: Not on file  . Highest education level: Not on file  Occupational History  . Occupation: Network engineer  Social Needs  . Financial resource strain: Not on file  . Food insecurity:    Worry: Not on file    Inability: Not on file  . Transportation needs:    Medical: Not on file    Non-medical: Not on file  Tobacco Use  . Smoking status: Never Smoker  . Smokeless tobacco: Never Used  Substance and Sexual Activity  . Alcohol use: No  . Drug use: No  . Sexual activity: Not on file  Lifestyle  . Physical activity:    Days per week: Not on file    Minutes per session: Not on file  . Stress: Not on file  Relationships  . Social connections:    Talks on phone: Not on file    Gets together: Not on file    Attends religious service: Not on file    Active  member of club or organization: Not on file    Attends meetings of clubs or organizations: Not on file    Relationship status: Not on file  . Intimate partner violence:    Fear of current or ex partner: Not on file    Emotionally abused: Not on file    Physically abused: Not on file    Forced sexual activity: Not on file  Other Topics Concern  . Not on file  Social History Narrative  . Not on file     Physical Exam  Vital Signs and Nursing Notes reviewed Vitals:   12/25/18 1312 12/25/18 1337  BP: (!) 145/115 (!) 192/113  Pulse: 77   Resp: 16   Temp: 98.4 F (36.9 C)   SpO2: 100%     CONSTITUTIONAL:  well-appearing, NAD NEURO:  Alert and oriented x 3, no focal deficits EYES:  eyes equal and reactive ENT/NECK:  no LAD, no JVD CARDIO:  regular rate, well-perfused, normal S1 and S2 PULM:  CTAB no wheezing or rhonchi GI/GU:  normal bowel sounds, non-distended, non-tender  MSK/SPINE:  No gross deformities, no edema SKIN:  no rash, atraumatic PSYCH:  Appropriate speech and behavior  Diagnostic and Interventional Summary    Labs Reviewed  INFLUENZA PANEL BY PCR (TYPE A & B)    No orders to display    Medications - No data to display   Procedures Critical Care  ED Course and Medical Decision Making  I have reviewed the triage vital signs and the nursing notes.  Pertinent labs & imaging results that were available during my care of the patient were reviewed by me and considered in my medical decision making (see below for details).  Viral syndrome with fever, cough, nasal congestion, mild sore throat.  Considering influenza.  Patient is walking well, normal vital signs, clear lungs, no meningismus, per our guidelines not a candidate for coronavirus testing today.  Given the short duration of illness and lack of chest pain or shortness of breath, no indication for chest x-ray today.  Patient does have a known history of DVT but no evidence of DVT on exam today, nothing to suggest pulmonary embolism.  Patient is mostly anxious about the current pandemic, provided with reassurance, home isolation recommended for the next 2 weeks.  Swab for flu to aid with any future ED visits, treated empirically with Tamiflu.  After the discussed management above, the patient was determined to be safe for discharge.  The patient was in agreement with this plan and all questions regarding their care were answered.  ED return precautions were discussed and the patient will return to the ED with any significant worsening of condition.  Barth Kirks. Sedonia Small, MD [place] [email or web address]  Final Clinical Impressions(s) / ED Diagnoses     ICD-10-CM   1. Viral illness B34.9     ED Discharge Orders         Ordered    oseltamivir (TAMIFLU) 75 MG capsule  Every 12 hours     12/25/18 1343    hydrOXYzine (ATARAX/VISTARIL) 25 MG  tablet  Every 6 hours PRN     12/25/18 1343             Maudie Flakes, MD 12/25/18 1351

## 2018-12-25 NOTE — ED Notes (Signed)
Given po fluids 

## 2018-12-25 NOTE — Discharge Instructions (Addendum)
You were evaluated in the Emergency Department and after careful evaluation, we did not find any emergent condition requiring admission or further testing in the hospital.  Your symptoms today seem to be due to a viral illness, possibly the flu.  Your symptoms are considered mild and you do not warrant testing for the novel coronavirus today.  We recommend home isolation for the next 2 weeks to avoid spreading your virus to anyone else.  Take the Tamiflu medication as directed.  You can use the Atarax medication as needed for anxiety, but do not use this medication with alcohol or while driving as it can make you sleepy.  Please return to the Emergency Department if you experience any worsening of your condition.  We encourage you to follow up with a primary care provider.  Thank you for allowing [name] to be a part of your care.

## 2018-12-25 NOTE — ED Triage Notes (Signed)
Fever, cough and runny nose. States she feels weak. She took Tylenol 2 hours ago.

## 2019-01-15 ENCOUNTER — Telehealth: Payer: Self-pay

## 2019-01-15 MED ORDER — DILTIAZEM HCL ER 240 MG PO CP24: 240.0000 mg | ORAL_CAPSULE | Freq: Every day | ORAL | 1 refills | Status: DC

## 2019-01-15 MED ORDER — TELMISARTAN-HCTZ 80-25 MG PO TABS: 1.0000 | ORAL_TABLET | Freq: Every day | ORAL | 1 refills | Status: DC

## 2019-01-15 NOTE — Telephone Encounter (Signed)
Received call from patient who reports elevated blood pressures, dizziness, falls x 2, edema to ankles and feet and recent weight gain of approximately 15 pounds. She states she has been taking her diltiazem 240 mg daily and her telmisartan 80-25 daily as prescribed but she ran out today and needs new prescriptions.  Her BP today is 199/100, yesterday 200/116 and Tuesday 199/100, her heart rate has been between 68 and 92. Her weight today is 225#, up from 210# but can't specify over how much time. Last Friday she got lightheaded and fell down and the same thing happened again on Monday. She denies injuries from the falls. She states her ankles and feet are so swollen she can't put her shoes on.  The patient is very upset, anxious and irritated by being asked routine assessment questions and wants to see Dr. Agustin Cree today.  Informed that information would be relayed to Dr. Agustin Cree and his office will call her back.

## 2019-01-15 NOTE — Telephone Encounter (Signed)
Let's send Rx for meds to pharmacy  Can you ask her if she is ok with televisit, if so I can see her tomorrow

## 2019-01-15 NOTE — Telephone Encounter (Signed)
Refills for Diltiazem and Telmisartan sent to [facility] on [address] per patient preference

## 2019-01-15 NOTE — Telephone Encounter (Signed)
YOUR CARDIOLOGY TEAM HAS ARRANGED FOR AN E-VISIT FOR YOUR APPOINTMENT - PLEASE REVIEW IMPORTANT INFORMATION BELOW SEVERAL DAYS PRIOR TO YOUR APPOINTMENT  Due to the recent COVID-19 pandemic, we are transitioning in-person office visits to tele-medicine visits in an effort to decrease unnecessary exposure to our patients and staff. Medicare and most insurances are covering these visits without a copay needed. We also encourage you to sign up for MyChart if you have not already done so. You will need a smartphone if possible. For patients that do not have this, we can still complete the visit using a regular telephone but do prefer a smartphone to enable video when possible. You may have a close family member that lives with you that can help. If possible, we also ask that you have a blood pressure cuff and scale at home to measure your blood pressure, heart rate and weight prior to your scheduled appointment. Patients with clinical needs that need an in-person evaluation and testing will still be able to come to the office if absolutely necessary. If you have any questions, feel free to call our office.    IF YOU HAVE A SMARTPHONE, PLEASE DOWNLOAD THE WEBEX APP TO YOUR SMARTPHONE  - If Apple, go to App Store and type in WebEx in the search bar. Download [employer] Webex Meetings, the blue/green circle. The app is free but as with any other app download, your phone may require you to verify saved payment information or Apple password. You do NOT have to create a WebEx account.  - If Android, go to Google Play Store and type in WebEx in the search bar. Download [employer] Webex Meetings, the blue/green circle. The app is free but as with any other app download, your phone may require you to verify saved payment information or Android password. You do NOT have to create a WebEx account.  It is very helpful to have this downloaded before your visit.    2-3 DAYS BEFORE YOUR APPOINTMENT  You will receive a  telephone call from one of our [facility] team members - your caller ID may say "Unknown caller." If this is a video visit, we will confirm that you have been able to download the WebEx app. We will remind you check your blood pressure, heart rate and weight prior to your scheduled appointment. If you have an Apple Watch or Kardia, please upload any pertinent ECG strips the day before or morning of your appointment to MyChart. Our staff will also make sure you have reviewed the consent and agree to move forward with your scheduled tele-health visit.     THE DAY OF YOUR APPOINTMENT  Approximately 15 minutes prior to your scheduled appointment, you will receive a telephone call from one of HeartCare team - your caller ID may say "Unknown caller."  Our staff will confirm medications, vital signs for the day and any symptoms you may be experiencing. Please have this information available prior to the time of visit start. It may also be helpful for you to have a pad of paper and pen handy for any instructions given during your visit. They will also walk you through joining the WebEx smartphone meeting if this is a video visit.    CONSENT FOR TELE-HEALTH VISIT - PLEASE REVIEW  I hereby voluntarily request, consent and authorize [facility] and its employed or contracted physicians, physician assistants, nurse practitioners or other licensed health care professionals (the Practitioner), to provide me with telemedicine health care services (the "Services") as   deemed necessary by the treating Practitioner. I acknowledge and consent to receive the Services by the Practitioner via telemedicine. I understand that the telemedicine visit will involve communicating with the Practitioner through live audiovisual communication technology and the disclosure of certain medical information by electronic transmission. I acknowledge that I have been given the opportunity to request an in-person assessment or other available  alternative prior to the telemedicine visit and am voluntarily participating in the telemedicine visit.  I understand that I have the right to withhold or withdraw my consent to the use of telemedicine in the course of my care at any time, without affecting my right to future care or treatment, and that the Practitioner or I may terminate the telemedicine visit at any time. I understand that I have the right to inspect all information obtained and/or recorded in the course of the telemedicine visit and may receive copies of available information for a reasonable fee.  I understand that some of the potential risks of receiving the Services via telemedicine include:  Marland Kitchen Delay or interruption in medical evaluation due to technological equipment failure or disruption; . Information transmitted may not be sufficient (e.g. poor resolution of images) to allow for appropriate medical decision making by the Practitioner; and/or  . In rare instances, security protocols could fail, causing a breach of personal health information.  Furthermore, I acknowledge that it is my responsibility to provide information about my medical history, conditions and care that is complete and accurate to the best of my ability. I acknowledge that Practitioner's advice, recommendations, and/or decision may be based on factors not within their control, such as incomplete or inaccurate data provided by me or distortions of diagnostic images or specimens that may result from electronic transmissions. I understand that the practice of medicine is not an exact science and that Practitioner makes no warranties or guarantees regarding treatment outcomes. I acknowledge that I will receive a copy of this consent concurrently upon execution via email to the email address I last provided but may also request a printed copy by calling the office of [facility].    I understand that my insurance will be billed for this visit.   I have read or had  this consent read to me. . I understand the contents of this consent, which adequately explains the benefits and risks of the Services being provided via telemedicine.  . I have been provided ample opportunity to ask questions regarding this consent and the Services and have had my questions answered to my satisfaction. . I give my informed consent for the services to be provided through the use of telemedicine in my medical care  By participating in this telemedicine visit I agree to the above. Patient consented over the phone

## 2019-01-15 NOTE — Telephone Encounter (Signed)
Patient has been contacted and has agreed to do a televisit for tomorrow 01/16/2019 at 2 pm.

## 2019-01-15 NOTE — Telephone Encounter (Signed)
Received call from patient who reports elevated blood pressures, dizziness, falls x 2, edema to ankles and feet and recent weight gain of approximately 15 pounds. She states she has been taking her diltiazem 240 mg daily and her telmisartan 80-25 daily as prescribed but she ran out today and needs new prescriptions.  Her BP today is 199/100, yesterday 200/116 and Tuesday 199/100, her heart rate has been between 68 and 92. Her weight today is 225#, up from 210# but can't specify over how much time. Last Friday she got lightheaded and fell down and the same thing happened again on Monday. She denies injuries from the falls. She states her ankles and feet are so swollen she can't put her shoes on.  The patient is very upset, anxious and irritated by being asked routine assessment questions and wants to see Dr. Agustin Cree today.  Informed that information would be relayed to Dr. Agustin Cree and his office will call her back.  pls advise, tx!

## 2019-01-16 ENCOUNTER — Telehealth (INDEPENDENT_AMBULATORY_CARE_PROVIDER_SITE_OTHER): Payer: 59 | Admitting: Cardiology

## 2019-01-16 ENCOUNTER — Encounter: Payer: Self-pay | Admitting: Cardiology

## 2019-01-16 ENCOUNTER — Other Ambulatory Visit: Payer: Self-pay

## 2019-01-16 VITALS — BP 192/102 | HR 72 | Wt 219.0 lb

## 2019-01-16 DIAGNOSIS — I10 Essential (primary) hypertension: Secondary | ICD-10-CM

## 2019-01-16 DIAGNOSIS — R0789 Other chest pain: Secondary | ICD-10-CM

## 2019-01-16 DIAGNOSIS — R42 Dizziness and giddiness: Secondary | ICD-10-CM

## 2019-01-16 DIAGNOSIS — R600 Localized edema: Secondary | ICD-10-CM

## 2019-01-16 MED ORDER — FUROSEMIDE 20 MG PO TABS: 20.0000 mg | ORAL_TABLET | Freq: Every day | ORAL | 1 refills | Status: DC

## 2019-01-16 NOTE — Patient Instructions (Signed)
Medication Instructions:  Your physician has recommended you make the following change in your medication:  Furosemide 20mg  daily   If you need a refill on your cardiac medications before your next appointment, please call your pharmacy.   Lab work: None  If you have labs (blood work) drawn today and your tests are completely normal, you will receive your results only by: Marland Kitchen MyChart Message (if you have MyChart) OR . A paper copy in the mail If you have any lab test that is abnormal or we need to change your treatment, we will call you to review the results.  Testing/Procedures: None  Follow-Up: At [facility], you and your health needs are our priority.  As part of our continuing mission to provide you with exceptional heart care, we have created designated Provider Care Teams.  These Care Teams include your primary Cardiologist (physician) and Advanced Practice Providers (APPs -  Physician Assistants and Nurse Practitioners) who all work together to provide you with the care you need, when you need it. You will need a follow up appointment in 2 weeks.  Please call our office 2 months in advance to schedule this appointment.  You may see No primary care provider on file. or another member of our [employer] Provider Team in [place]: Shirlee More, MD . Jyl Heinz, MD  Any Other Special Instructions Will Be Listed Below (If Applicable).

## 2019-01-16 NOTE — Progress Notes (Signed)
Virtual Visit via Video Note   This visit type was conducted due to national recommendations for restrictions regarding the COVID-19 Pandemic (e.g. social distancing) in an effort to limit this patient's exposure and mitigate transmission in our community.  Due to her co-morbid illnesses, this patient is at least at moderate risk for complications without adequate follow up.  This format is felt to be most appropriate for this patient at this time.  All issues noted in this document were discussed and addressed.  A limited physical exam was performed with this format.  Please refer to the patient's chart for her consent to telehealth for [facility].  Evaluation Performed:  Follow-up visit  This visit type was conducted due to national recommendations for restrictions regarding the COVID-19 Pandemic (e.g. social distancing).  This format is felt to be most appropriate for this patient at this time.  All issues noted in this document were discussed and addressed.  No physical exam was performed (except for noted visual exam findings with Video Visits).  Please refer to the patient's chart (MyChart message for video visits and phone note for telephone visits) for the patient's consent to telehealth for [facility].  Date:  01/16/2019  ID: Destiny Henderson, DOB 10/16/1960, MRN [id number]   Patient Location:  [address] [place] [address]   Provider location:   [place] Office  PCP:  Lorenda Hatchet, FNP  Cardiologist:  Jenne Campus, MD     Chief Complaint: I do not feel well  History of Present Illness:    Destiny Henderson is a 58 y.o. female  who presents via audio/video conferencing for a telehealth visit today.  With complex past medical history that include hypertension which is difficult to control, also some anxiety.  She requested to be seen through the video link because of recent concerns.  She does have high blood pressure and gradually I am  trying to put her on right medication there is some difficulty because of her unable to tolerate medications.  She reports to have some swelling of lower extremities and she showed me to the video link and a mildly swollen interestingly she is telling me that swelling is much worse in the morning rather than the evening time.  She also described to have some pain in her leg when she tried to lay down at any time and sleep.  She did see a rheumatologist who sent a lot of test and apparently everything came negative there was some concern about her having potentially neuropathy.  She was referred to neurology, however because of coronavirus situation appointment has been postponed until July.  She reports also to have some dizziness when she walks she sometimes lose her balance.  Which could be a part of neuropathy.  In terms of her home her blood pressure and swelling of lower extremities I asked her to start taking small dose of diuretic she is already taking 25 mg hydrochlorothiazide I will add to 20 mg of furosemide just to see how she does and I see her back in my office to video link in about 2 weeks.  I warned her about potential side effects which include dizziness.  I checked last Chem-7 kidney function was normal her potassium was actually on the higher side and she does take ARB.   The patient does not have symptoms concerning for COVID-19 infection (fever, chills, cough, or new SHORTNESS OF BREATH).    Prior CV  studies:   The following studies were reviewed today:       Past Medical History:  Diagnosis Date  . Anemia   . Benign tumor of bladder   . DVT (deep venous thrombosis) ([place])   . Essential hypertension, benign 03/03/2013  . Fibroma of heart   . Obesity 03/03/2013    Past Surgical History:  Procedure Laterality Date  . BACK SURGERY    . bladder tack    . DILATION AND CURETTAGE OF UTERUS     after SAB  . HEART TUMOR EXCISION       Current Meds  Medication Sig  .  acetaminophen (TYLENOL) 500 MG tablet Take 1 tablet (500 mg total) by mouth every 6 (six) hours as needed.  Marland Kitchen b complex vitamins capsule Take 1 capsule by mouth daily.  . Coenzyme Q10 (CO Q 10 PO) Take by mouth.  . diltiazem (DILACOR XR) 240 MG 24 hr capsule Take 1 capsule (240 mg total) by mouth daily.  Marland Kitchen ECHINACEA PO Take by mouth.  . fish oil-omega-3 fatty acids 1000 MG capsule Take 2 g by mouth daily.  Marland Kitchen FLAXSEED, LINSEED, PO Take by mouth.  . hydrOXYzine (ATARAX/VISTARIL) 25 MG tablet Take 1 tablet (25 mg total) by mouth every 6 (six) hours as needed for anxiety.  . Melatonin 5 MG CAPS Take by mouth as needed.   . Multiple Vitamins-Minerals (MEGA MULTIVITAMIN FOR WOMEN) TABS Take by mouth.  . promethazine (PHENERGAN) 25 MG tablet Take 1 tablet (25 mg total) by mouth every 6 (six) hours as needed for nausea or vomiting.  . Pyridoxine HCl (VITAMIN B-6) 500 MG tablet Take 500 mg by mouth daily.  Marland Kitchen telmisartan-hydrochlorothiazide (MICARDIS HCT) 80-25 MG tablet Take 1 tablet by mouth daily.  Marland Kitchen VITAMIN A OP Apply to eye.  . vitamin B-12 (CYANOCOBALAMIN) 1000 MCG tablet Take 1,000 mcg by mouth daily.  . vitamin C (ASCORBIC ACID) 500 MG tablet Take 500 mg by mouth daily.  Marland Kitchen VITAMIN D, CHOLECALCIFEROL, PO Take by mouth.      Family History: The patient's family history includes Breast cancer in her mother; Cancer in her father, maternal aunt, maternal grandfather, and another family member; Colon cancer in her father; Diabetes in her maternal uncle; Heart failure in her maternal aunt and maternal grandmother; Hyperlipidemia in her mother; Hypertension in her maternal aunt, maternal aunt, maternal uncle, and mother; Thyroid disease in her sister and sister.   ROS:   Please see the history of present illness.     All other systems reviewed and are negative.   Labs/Other Tests and Data Reviewed:     Recent Labs: 10/18/2018: ALT 17 11/25/2018: Hemoglobin 13.0; Platelets 357 12/05/2018: BUN  11; Creatinine, Ser 1.00; Potassium 4.7; Sodium 142  Recent Lipid Panel No results found for: CHOL, TRIG, HDL, CHOLHDL, VLDL, LDLCALC, LDLDIRECT    Exam:    Vital Signs:  BP (!) 192/102   Pulse 72   Wt 219 lb (99.3 kg)   BMI 37.59 kg/m     Wt Readings from Last 3 Encounters:  01/16/19 219 lb (99.3 kg)  12/25/18 220 lb 10.9 oz (100.1 kg)  12/22/18 220 lb 12.8 oz (100.2 kg)     Well nourished, well developed female in no acute distress. Alert awake and x3.  With talking to doxy.me.  She is in the living room her legs are minimally swollen.  I do not see any JVD  Diagnosis for this visit:   1. Essential hypertension,  benign   2. Pedal edema   3. Dizziness   4. Atypical chest pain      ASSESSMENT & PLAN:    1.  Essential hypertension blood pressure appears to be still poorly controlled we will add a little bit more diuretic to see if that helps. 2.  Pedal edema again diuretic will be added.  In the future she will require a Chem-7. 3.  Dizziness I asked her to be careful about that.  She is scheduled to see neurology which is a good idea. 4.  Atypical chest pain denies having any  COVID-19 Education: The signs and symptoms of COVID-19 were discussed with the patient and how to seek care for testing (follow up with PCP or arrange E-visit).  The importance of social distancing was discussed today.  Patient Risk:   After full review of this patients clinical status, I feel that they are at least moderate risk at this time.  Time:   Today, I have spent 17 minutes with the patient with telehealth technology discussing pt health issues. Visit was finished at 2:37 PM.    Medication Adjustments/Labs and Tests Ordered: Current medicines are reviewed at length with the patient today.  Concerns regarding medicines are outlined above.  No orders of the defined types were placed in this encounter.  Medication changes: No orders of the defined types were placed in this  encounter.    Disposition: Follow-up in 2 weeks start Lasix 20 daily  Signed, Park Liter, MD, [facility] 01/16/2019 2:30 PM    [place]

## 2019-01-28 ENCOUNTER — Telehealth: Payer: 59 | Admitting: Cardiology

## 2019-01-28 ENCOUNTER — Other Ambulatory Visit: Payer: Self-pay

## 2019-02-10 ENCOUNTER — Telehealth: Payer: Self-pay | Admitting: Cardiology

## 2019-02-10 NOTE — Telephone Encounter (Signed)
Patient calls and states her BP is starting to go back up again.  Also states she is on a fluid pill but states she does not urinate that much and wanders if something could wrong with her kidneys. Please call patient to discuss. She can be reached at [phone]

## 2019-02-10 NOTE — Telephone Encounter (Signed)
Patient states that her blood pressure has went up to 198/107. She admits that she is under stress at this time. She currently denies urinary symptoms and has increased her urinary symptoms. I expressed reassurances that her previous labs did not show any kidney issues but would find out if she needs a teleavisit or just labs.

## 2019-02-11 NOTE — Telephone Encounter (Signed)
Left voicemail for patient to call the office to be worked in for a virtual visit. Advised if possible we would like to talk to her tomorrow or early next week.

## 2019-02-11 NOTE — Telephone Encounter (Signed)
Lets do televisit next week with her

## 2019-02-24 ENCOUNTER — Telehealth: Payer: Self-pay

## 2019-02-24 NOTE — Telephone Encounter (Signed)
Patient called with concerned BP is 200/115, blurred vision, ankles and legs, feet are swollen and she has a headache. She is currently taking lasix and she is not having increased urination on this medication. Current weight is 142 lbs. She had intermittent chest pain for the past week. RN advised patient to go to ED but she does not want to risk getting COVID 19. Note routed to Dr.K and his team.She is scheduled for virtual appt.

## 2019-02-25 NOTE — Telephone Encounter (Signed)
Will keep appointment on Friday and talk about it

## 2019-02-25 NOTE — Telephone Encounter (Signed)
Left message for patient to return call.

## 2019-02-26 NOTE — Telephone Encounter (Signed)
Left message for patient to return call.

## 2019-02-27 ENCOUNTER — Telehealth: Payer: 59 | Admitting: Cardiology

## 2019-02-27 ENCOUNTER — Other Ambulatory Visit: Payer: Self-pay

## 2019-03-03 NOTE — Telephone Encounter (Signed)
Patient was contacted multiple times for her appointment with no answer or return phone call.

## 2019-03-17 ENCOUNTER — Telehealth: Payer: Self-pay | Admitting: Emergency Medicine

## 2019-03-17 NOTE — Telephone Encounter (Signed)
Called patient accidentally thinking she was someone else. I informed her of this. Then she asked why she hadn't heard back from her phone encounter on 02/24/2019. I informed her we were supposed to have a televisit with her on 02/27/2019 however we didn't reach her. I let her know messages were left regarding this. I attempted to call her and reschedule the call kept being dropped. Estill Bamberg, CMA called her and left a voicemail to call us back.

## 2019-04-19 ENCOUNTER — Emergency Department (HOSPITAL_BASED_OUTPATIENT_CLINIC_OR_DEPARTMENT_OTHER): Payer: 59

## 2019-04-19 ENCOUNTER — Other Ambulatory Visit: Payer: Self-pay

## 2019-04-19 ENCOUNTER — Emergency Department (HOSPITAL_BASED_OUTPATIENT_CLINIC_OR_DEPARTMENT_OTHER)
Admission: EM | Admit: 2019-04-19 | Discharge: 2019-04-19 | Disposition: A | Discharge status: Home / Self Care | Payer: 59 | Attending: Emergency Medicine | Admitting: Emergency Medicine

## 2019-04-19 DIAGNOSIS — Y999 Unspecified external cause status: Secondary | ICD-10-CM | POA: Diagnosis not present

## 2019-04-19 DIAGNOSIS — Z79899 Other long term (current) drug therapy: Secondary | ICD-10-CM | POA: Diagnosis not present

## 2019-04-19 DIAGNOSIS — W108XXA Fall (on) (from) other stairs and steps, initial encounter: Secondary | ICD-10-CM | POA: Insufficient documentation

## 2019-04-19 DIAGNOSIS — Y9289 Other specified places as the place of occurrence of the external cause: Secondary | ICD-10-CM | POA: Diagnosis not present

## 2019-04-19 DIAGNOSIS — Y9389 Activity, other specified: Secondary | ICD-10-CM | POA: Diagnosis not present

## 2019-04-19 DIAGNOSIS — S82831A Other fracture of upper and lower end of right fibula, initial encounter for closed fracture: Secondary | ICD-10-CM | POA: Insufficient documentation

## 2019-04-19 DIAGNOSIS — Z91040 Latex allergy status: Secondary | ICD-10-CM | POA: Diagnosis not present

## 2019-04-19 DIAGNOSIS — I10 Essential (primary) hypertension: Secondary | ICD-10-CM | POA: Diagnosis not present

## 2019-04-19 DIAGNOSIS — S99911A Unspecified injury of right ankle, initial encounter: Secondary | ICD-10-CM | POA: Diagnosis present

## 2019-04-19 LAB — URINALYSIS, ROUTINE W REFLEX MICROSCOPIC
Bilirubin Urine: NEGATIVE
Glucose, UA: NEGATIVE mg/dL
Ketones, ur: NEGATIVE mg/dL
Leukocytes,Ua: NEGATIVE
Nitrite: NEGATIVE
Protein, ur: NEGATIVE mg/dL
Specific Gravity, Urine: 1.025 (ref 1.005–1.030)
pH: 6 (ref 5.0–8.0)

## 2019-04-19 LAB — URINALYSIS, MICROSCOPIC (REFLEX)

## 2019-04-19 MED ORDER — IBUPROFEN 800 MG PO TABS: 800.0000 mg | ORAL_TABLET | Freq: Three times a day (TID) | ORAL | 0 refills | Status: DC

## 2019-04-19 MED ORDER — NAPROXEN 250 MG PO TABS
500.0000 mg | ORAL_TABLET | Freq: Once | ORAL | Status: AC
  Administered 2019-04-19: 500 mg via ORAL
  Filled 2019-04-19: qty 2

## 2019-04-19 NOTE — ED Triage Notes (Addendum)
Pt states she missed a step and fell, twisting her right ankle and knee approx 1 hr pta. Pt states she is anxious, feels nauseated. Pt states not able to bear weight. Pt denies otc medication pta.

## 2019-04-19 NOTE — Discharge Instructions (Signed)
Please read and follow all provided instructions.  Your diagnoses today include:  1. Closed avulsion fracture of distal end of right fibula, initial encounter     Tests performed today include: An x-ray of the affected area - shows broken fibula bone Urine test - no definite infection Vital signs. See below for your results today.   Medications prescribed:  Ibuprofen (Motrin, Advil) - anti-inflammatory pain medication Do not exceed 800mg  ibuprofen every 8 hours, take with food  You have been prescribed an anti-inflammatory medication or NSAID. Take with food. Take smallest effective dose for the shortest duration needed for your pain. Stop taking if you experience stomach pain or vomiting.   Take any prescribed medications only as directed.  Home care instructions:  Follow any educational materials contained in this packet Follow R.I.C.E. Protocol: R - rest your injury  I  - use ice on injury without applying directly to skin C - compress injury with bandage or splint E - elevate the injury as much as possible  Follow-up instructions: Please follow-up with the provided orthopedic physician.   Return instructions:  Please return if your toes or feet are numb or tingling, appear gray or blue, or you have severe pain (also elevate the leg and loosen splint or wrap if you were given one) Please return to the Emergency Department if you experience worsening symptoms.  Please return if you have any other emergent concerns.  Additional Information:  Your vital signs today were: BP (!) 154/77 (BP Location: Right Arm)    Pulse 68    Temp 98.3 F (36.8 C) (Oral)    Resp 20    Ht 5\' 4"  (1.626 m)    Wt 108.9 kg    SpO2 99%    BMI 41.20 kg/m  If your blood pressure (BP) was elevated above 135/85 this visit, please have this repeated by your doctor within one month. --------------

## 2019-04-19 NOTE — ED Notes (Signed)
Pt verbalized understanding of dc instructions.

## 2019-04-19 NOTE — ED Provider Notes (Signed)
[facility] EMERGENCY DEPARTMENT Provider Note   CSN: [id number] Arrival date & time: 04/19/19  1341     History   Chief Complaint Chief Complaint  Patient presents with  . Ankle Pain    HPI Destiny Henderson is a 58 y.o. female.     Patient presents to the emergency department today with acute onset of right ankle pain and right knee pain after she tripped going down a stair.  She states that she twisted her ankle and cannot bear weight.  She states that the knee pain is not as severe and she is able to move her knee.  She denies head or neck injury.  No numbness or tingling.  No treatments prior to arrival.  She complains of some mild nausea.  Also notes that she feels like she has an early urinary tract infection.  She is unable to give explicit symptoms.  She had a UTI back in February.     Past Medical History:  Diagnosis Date  . Anemia   . Benign tumor of bladder   . DVT (deep venous thrombosis) ([place])   . Essential hypertension, benign 03/03/2013  . Fibroma of heart   . Obesity 03/03/2013    Patient Active Problem List   Diagnosis Date Noted  . Atypical chest pain 12/05/2018  . Dizziness 11/24/2018  . Low back pain radiating to right leg 02/17/2018  . Pedal edema 02/06/2018  . History of DVT (deep vein thrombosis) 02/06/2018  . Right wrist injury, subsequent encounter 01/24/2018  . Right knee injury, subsequent encounter 01/24/2018  . Sprain of MCL (medial collateral ligament) of knee 12/12/2016  . Sprain of right ankle 12/12/2016  . Menorrhagia 05/20/2013  . Essential hypertension, benign 03/03/2013  . Obesity 03/03/2013    Past Surgical History:  Procedure Laterality Date  . BACK SURGERY    . bladder tack    . DILATION AND CURETTAGE OF UTERUS     after SAB  . HEART TUMOR EXCISION       OB History    Gravida  4   Para  3   Term  3   Preterm      AB  1   Living  3     SAB  1   TAB      Ectopic      Multiple      Live Births               Home Medications    Prior to Admission medications   Medication Sig Start Date End Date Taking? Authorizing Provider  acetaminophen (TYLENOL) 500 MG tablet Take 1 tablet (500 mg total) by mouth every 6 (six) hours as needed. 01/21/18   Frederica Kuster, PA-C  b complex vitamins capsule Take 1 capsule by mouth daily.    [provider]  Coenzyme Q10 (CO Q 10 PO) Take by mouth.    [provider]  diltiazem (DILACOR XR) 240 MG 24 hr capsule Take 1 capsule (240 mg total) by mouth daily. 01/15/19   Park Liter, MD  ECHINACEA PO Take by mouth.    [provider]  fish oil-omega-3 fatty acids 1000 MG capsule Take 2 g by mouth daily.    [provider]  FLAXSEED, LINSEED, PO Take by mouth.    [provider]  furosemide (LASIX) 20 MG tablet Take 1 tablet (20 mg total) by mouth daily. 01/16/19 04/16/19  Park Liter, MD  hydrOXYzine (ATARAX/VISTARIL) 25 MG tablet Take 1 tablet (25 mg total) by mouth every 6 (six) hours as needed for anxiety. 12/25/18   Maudie Flakes, MD  ibuprofen (ADVIL) 800 MG tablet Take 1 tablet (800 mg total) by mouth 3 (three) times daily. 04/19/19   Carlisle Cater, PA-C  Melatonin 5 MG CAPS Take by mouth as needed.     [provider]  Multiple Vitamins-Minerals (MEGA MULTIVITAMIN FOR WOMEN) TABS Take by mouth.    [provider]  promethazine (PHENERGAN) 25 MG tablet Take 1 tablet (25 mg total) by mouth every 6 (six) hours as needed for nausea or vomiting. 06/12/18   Margarita Mail, PA-C  Pyridoxine HCl (VITAMIN B-6) 500 MG tablet Take 500 mg by mouth daily.    [provider]  telmisartan-hydrochlorothiazide (MICARDIS HCT) 80-25 MG tablet Take 1 tablet by mouth daily. 01/15/19   Park Liter, MD  VITAMIN A OP Apply to eye.    [provider]  vitamin B-12 (CYANOCOBALAMIN) 1000 MCG tablet Take 1,000 mcg by mouth daily.    [provider]   vitamin C (ASCORBIC ACID) 500 MG tablet Take 500 mg by mouth daily.    [provider]  VITAMIN D, CHOLECALCIFEROL, PO Take by mouth.    [provider]    Family History Family History  Problem Relation Age of Onset  . Breast cancer Mother   . Hypertension Mother   . Hyperlipidemia Mother   . Colon cancer Father   . Cancer Father   . Thyroid disease Sister   . Cancer Other   . Thyroid disease Sister   . Hypertension Maternal Aunt   . Heart failure Maternal Aunt   . Cancer Maternal Aunt   . Hypertension Maternal Aunt   . Hypertension Maternal Uncle   . Diabetes Maternal Uncle   . Heart failure Maternal Grandmother   . Cancer Maternal Grandfather        prostate    Social History Social History   Tobacco Use  . Smoking status: Never Smoker  . Smokeless tobacco: Never Used  Substance Use Topics  . Alcohol use: No  . Drug use: No     Allergies   Lisinopril, Lasix [furosemide], Latex, and Prednisone   Review of Systems Review of Systems  Gastrointestinal: Positive for nausea.  Musculoskeletal: Positive for arthralgias, gait problem, joint swelling and myalgias. Negative for back pain and neck pain.  Skin: Negative for wound.  Neurological: Negative for weakness and numbness.     Physical Exam Updated Vital Signs BP (!) 154/77 (BP Location: Right Arm)   Pulse 68   Temp 98.3 F (36.8 C) (Oral)   Resp 20   Ht 5\' 4"  (1.626 m)   Wt 108.9 kg   SpO2 99%   BMI 41.20 kg/m   Physical Exam Vitals signs and nursing note reviewed.  Constitutional:      Appearance: She is well-developed.  HENT:     Head: Normocephalic and atraumatic.  Eyes:     Pupils: Pupils are equal, round, and reactive to light.  Neck:     Musculoskeletal: Normal range of motion and neck supple.  Cardiovascular:     Pulses: Normal pulses. No decreased pulses.  Musculoskeletal:        General: Tenderness present.     Right hip: Normal.     Right knee: She exhibits  normal range of motion, no swelling and no effusion. Tenderness found.     Right ankle:  She exhibits decreased range of motion and swelling. Tenderness. Lateral malleolus tenderness found.     Left ankle: Normal.     Right lower leg: Normal.     Right foot: Normal. Normal range of motion. No tenderness or bony tenderness.  Skin:    General: Skin is warm and dry.  Neurological:     Mental Status: She is alert.     Sensory: No sensory deficit.     Comments: Motor, sensation, and vascular distal to the injury is fully intact.       ED Treatments / Results  Labs (all labs ordered are listed, but only abnormal results are displayed) Labs Reviewed  URINALYSIS, ROUTINE W REFLEX MICROSCOPIC - Abnormal; Notable for the following components:      Result Value   Hgb urine dipstick TRACE (*)    All other components within normal limits  URINALYSIS, MICROSCOPIC (REFLEX) - Abnormal; Notable for the following components:   Bacteria, UA FEW (*)    All other components within normal limits    EKG None  Radiology Dg Ankle Complete Right  Result Date: 04/19/2019 CLINICAL DATA:  Fall EXAM: RIGHT ANKLE - COMPLETE 3+ VIEW COMPARISON:  None. FINDINGS: There is a nondisplaced fracture of the distal right fibula, in oblique orientation. No tibia fracture. No ankle dislocation. Moderate lateral soft tissue swelling. Small joint effusion. IMPRESSION: Nondisplaced oblique fracture of the distal right fibula. No dislocation. Electronically Signed   By: Ulyses Jarred M.D.   On: 04/19/2019 15:04   Dg Knee Complete 4 Views Right  Result Date: 04/19/2019 CLINICAL DATA:  Fall EXAM: RIGHT KNEE - COMPLETE 4+ VIEW COMPARISON:  01/21/2018 FINDINGS: No evidence of fracture, dislocation, or joint effusion. No evidence of arthropathy or other focal bone abnormality. Soft tissues are unremarkable. IMPRESSION: Negative. Electronically Signed   By: Ulyses Jarred M.D.   On: 04/19/2019 15:02    Procedures Procedures  (including critical care time)  Medications Ordered in ED Medications  naproxen (NAPROSYN) tablet 500 mg (500 mg Oral Given 04/19/19 1442)     Initial Impression / Assessment and Plan / ED Course  I have reviewed the triage vital signs and the nursing notes.  Pertinent labs & imaging results that were available during my care of the patient were reviewed by me and considered in my medical decision making (see chart for details).        Patient seen and examined. X-rays ordered. Naproxen for pain.   Vital signs reviewed and are as follows: BP (!) 154/77 (BP Location: Right Arm)   Pulse 68   Temp 98.3 F (36.8 C) (Oral)   Resp 20   Ht 5\' 4"  (1.626 m)   Wt 108.9 kg   SpO2 99%   BMI 41.20 kg/m   3:36 PM X-ray images personally reviewed and interpreted.    Patient will be placed in a cam walker and given crutches for stability.  She requests ibuprofen prescription for pain.  Discussed rice protocol.  Orthopedic referral given.  Encouraged to return with worsening pain, redness, swelling, new symptoms or other concerns.  Final Clinical Impressions(s) / ED Diagnoses   Final diagnoses:  Closed avulsion fracture of distal end of right fibula, initial encounter   Patient with distal fibula fracture, closed.  Right lower extremity is neurovascularly intact.  Treatment as above.  Patient with a few [place] blood cells in her urine but it is not a clean-catch.  Also she is not having overt symptoms.  Will not  treat UTI at this time.   ED Discharge Orders         Ordered    ibuprofen (ADVIL) 800 MG tablet  3 times daily     04/19/19 1530           Carlisle Cater, Hershal Coria 04/19/19 1538    Gareth Morgan, MD 04/21/19 1126

## 2019-04-19 NOTE — ED Notes (Signed)
Ask patient to end phone call to go over correct use of cam walker and crutches. Patient just put phone down. Went over how to apply [employer] and how to use Crutches. Patient voice understanding and stated Cam Walker fit comfortably not too tight or loose.

## 2019-04-19 NOTE — ED Notes (Signed)
Pt requesting urinalysis to check for UTI- pt denies symptoms.

## 2019-04-21 ENCOUNTER — Telehealth: Payer: Self-pay | Admitting: Orthopaedic Surgery

## 2019-04-21 NOTE — Telephone Encounter (Signed)
Called patient left message to return call to schedule an appointment with Dr Erlinda Hong for right fibula fx   Friday or Wednesday of next week  [phone]

## 2019-04-23 ENCOUNTER — Ambulatory Visit: Payer: 59 | Admitting: Orthopaedic Surgery

## 2019-04-23 ENCOUNTER — Telehealth: Payer: Self-pay | Admitting: Orthopaedic Surgery

## 2019-04-23 NOTE — Telephone Encounter (Signed)
Returned call to patient left message to return call    Patient advised she may have to go to the emergency room   [phone]

## 2019-04-29 ENCOUNTER — Other Ambulatory Visit: Payer: Self-pay

## 2019-04-29 ENCOUNTER — Ambulatory Visit (INDEPENDENT_AMBULATORY_CARE_PROVIDER_SITE_OTHER): Payer: 59 | Admitting: Orthopaedic Surgery

## 2019-04-29 ENCOUNTER — Ambulatory Visit: Payer: 59

## 2019-04-29 DIAGNOSIS — S8261XA Displaced fracture of lateral malleolus of right fibula, initial encounter for closed fracture: Secondary | ICD-10-CM

## 2019-04-29 MED ORDER — ASPIRIN EC 81 MG PO TBEC: 81.0000 mg | DELAYED_RELEASE_TABLET | Freq: Two times a day (BID) | ORAL | 0 refills | Status: DC

## 2019-04-29 MED ORDER — IBUPROFEN 800 MG PO TABS: 800.0000 mg | ORAL_TABLET | Freq: Three times a day (TID) | ORAL | 2 refills | Status: DC | PRN

## 2019-04-29 MED ORDER — TRAMADOL HCL 50 MG PO TABS: 50.0000 mg | ORAL_TABLET | Freq: Three times a day (TID) | ORAL | 2 refills | Status: DC | PRN

## 2019-04-29 MED ORDER — METHOCARBAMOL 750 MG PO TABS: 750.0000 mg | ORAL_TABLET | Freq: Two times a day (BID) | ORAL | 0 refills | Status: DC | PRN

## 2019-04-29 MED FILL — IBUPROFEN 800 MG TAB: 800 | 10 days supply | Qty: 30 | Fill #0

## 2019-04-29 MED FILL — ASPIRIN ADULT LOW STRENGTH: 81 | 21 days supply | Qty: 42 | Fill #0

## 2019-04-29 MED FILL — traMADol HCL 50 MG TABS: 50 | 5 days supply | Qty: 30 | Fill #0

## 2019-04-29 MED FILL — METHOCARBAMOL 750 MG TABS: 750 | 30 days supply | Qty: 60 | Fill #0

## 2019-04-29 NOTE — Progress Notes (Signed)
Office Visit Note   Patient: Destiny Henderson           Date of Birth: [birth year]           MRN: [id number] Visit Date: 04/29/2019              Requested by: Lorenda Hatchet, [address] [place],  [place] [address] PCP: Lorenda Hatchet, FNP   Assessment & Plan: Visit Diagnoses:  1. Displaced fracture of lateral malleolus of right fibula, initial encounter for closed fracture     Plan: Impression is minimally displaced Weber B fibula fracture that is stable.  She has been weightbearing on this and this does not appear to be displacing or worsening.  Therefore we will continue to treat this nonoperatively in a cam boot and crutches with less than 50% weightbearing.  She has been provided with an ASO brace while sleeping.  Prescriptions were sent to the pharmacy.  Aspirin for DVT prophylaxis.  Recheck in a week with three-view x-rays of the right ankle.  Follow-Up Instructions: Return in about 1 week (around 05/06/2019).   Orders:  Orders Placed This Encounter  Procedures  . XR Ankle Complete Right   Meds ordered this encounter  Medications  . aspirin EC 81 MG tablet    Sig: Take 1 tablet (81 mg total) by mouth 2 (two) times daily.    Dispense:  42 tablet    Refill:  0  . methocarbamol (ROBAXIN) 750 MG tablet    Sig: Take 1 tablet (750 mg total) by mouth 2 (two) times daily as needed for muscle spasms.    Dispense:  60 tablet    Refill:  0  . traMADol (ULTRAM) 50 MG tablet    Sig: Take 1-2 tablets (50-100 mg total) by mouth 3 (three) times daily as needed.    Dispense:  30 tablet    Refill:  2  . ibuprofen (ADVIL) 800 MG tablet    Sig: Take 1 tablet (800 mg total) by mouth every 8 (eight) hours as needed.    Dispense:  30 tablet    Refill:  2      Procedures: No procedures performed   Clinical Data: No additional findings.   Subjective: No chief complaint on file.   Destiny Henderson is a 58 year old female comes in for evaluation of acute right  ankle fracture that she suffered from a fall approximately 10 days ago.  She has been bearing weight in the boot since that time.  She does endorse swelling in both her feet and ankles.  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  She denies any calf pain or shortness of breath.  She does have a history of DVT.  She states that she has been elevating her right ankle.   Review of Systems  Constitutional: Negative.   HENT: Negative.   Eyes: Negative.   Respiratory: Negative.   Cardiovascular: Negative.   Endocrine: Negative.   Musculoskeletal: Negative.   Neurological: Negative.   Hematological: Negative.   Psychiatric/Behavioral: Negative.   All other systems reviewed and are negative.    Objective: Vital Signs: There were no vitals taken for this visit.  Physical Exam Vitals signs and nursing note reviewed.  Constitutional:      Appearance: She is well-developed.  HENT:     Head: Normocephalic and atraumatic.  Neck:     Musculoskeletal: Neck supple.  Pulmonary:  Effort: Pulmonary effort is normal.  Abdominal:     Palpations: Abdomen is soft.  Skin:    General: Skin is warm.     Capillary Refill: Capillary refill takes less than 2 seconds.  Neurological:     Mental Status: She is alert and oriented to person, place, and time.  Psychiatric:        Behavior: Behavior normal.        Thought Content: Thought content normal.        Judgment: Judgment normal.     Ortho Exam Right ankle exam shows moderate swelling with pitting edema bilaterally.  She has good sensation without any motor or sensory deficits.  Fibula is tender to palpation. Specialty Comments:  No specialty comments available.  Imaging: Xr Ankle Complete Right  Result Date: 04/29/2019 Stable minimally displaced fibula fracture.  No incongruity of the ankle joint.    PMFS History: Patient Active Problem List   Diagnosis Date Noted  . Atypical  chest pain 12/05/2018  . Dizziness 11/24/2018  . Low back pain radiating to right leg 02/17/2018  . Pedal edema 02/06/2018  . History of DVT (deep vein thrombosis) 02/06/2018  . Right wrist injury, subsequent encounter 01/24/2018  . Right knee injury, subsequent encounter 01/24/2018  . Sprain of MCL (medial collateral ligament) of knee 12/12/2016  . Sprain of right ankle 12/12/2016  . Menorrhagia 05/20/2013  . Essential hypertension, benign 03/03/2013  . Obesity 03/03/2013   Past Medical History:  Diagnosis Date  . Anemia   . Benign tumor of bladder   . DVT (deep venous thrombosis) ([place])   . Essential hypertension, benign 03/03/2013  . Fibroma of heart   . Obesity 03/03/2013    Family History  Problem Relation Age of Onset  . Breast cancer Mother   . Hypertension Mother   . Hyperlipidemia Mother   . Colon cancer Father   . Cancer Father   . Thyroid disease Sister   . Cancer Other   . Thyroid disease Sister   . Hypertension Maternal Aunt   . Heart failure Maternal Aunt   . Cancer Maternal Aunt   . Hypertension Maternal Aunt   . Hypertension Maternal Uncle   . Diabetes Maternal Uncle   . Heart failure Maternal Grandmother   . Cancer Maternal Grandfather        prostate    Past Surgical History:  Procedure Laterality Date  . BACK SURGERY    . bladder tack    . DILATION AND CURETTAGE OF UTERUS     after SAB  . HEART TUMOR EXCISION     Social History   Occupational History  . Occupation: Network engineer  Tobacco Use  . Smoking status: Never Smoker  . Smokeless tobacco: Never Used  Substance and Sexual Activity  . Alcohol use: No  . Drug use: No  . Sexual activity: Not on file

## 2019-05-08 ENCOUNTER — Other Ambulatory Visit: Payer: Self-pay

## 2019-05-08 ENCOUNTER — Ambulatory Visit (INDEPENDENT_AMBULATORY_CARE_PROVIDER_SITE_OTHER): Payer: 59 | Admitting: Orthopaedic Surgery

## 2019-05-08 ENCOUNTER — Telehealth: Payer: Self-pay | Admitting: Orthopaedic Surgery

## 2019-05-08 ENCOUNTER — Encounter: Payer: Self-pay | Admitting: Orthopaedic Surgery

## 2019-05-08 ENCOUNTER — Ambulatory Visit: Payer: Self-pay

## 2019-05-08 DIAGNOSIS — S8261XA Displaced fracture of lateral malleolus of right fibula, initial encounter for closed fracture: Secondary | ICD-10-CM | POA: Diagnosis not present

## 2019-05-08 MED FILL — TELMISARTAN-HCTZ 80-25 MG T: 80-25 | 30 days supply | Qty: 30 | Fill #0

## 2019-05-08 MED FILL — FLUCONAZOLE 150 MG TABS: 150 | 1 days supply | Qty: 1 | Fill #0

## 2019-05-08 MED FILL — FUROSEMIDE 20 MG TAB: 20 | 30 days supply | Qty: 30 | Fill #0

## 2019-05-08 NOTE — Telephone Encounter (Signed)
Patient called needing a itemized statement for her visit today. Patient will pick up the information. The number to contact patient is [phone]

## 2019-05-08 NOTE — Progress Notes (Signed)
Office Visit Note   Patient: Destiny Henderson           Date of Birth: 05-Apr-1961           MRN: [id number] Visit Date: 05/08/2019              Requested by: Lorenda Hatchet, [place],  [place] [address] PCP: Lorenda Hatchet, FNP   Assessment & Plan: Visit Diagnoses:  1. Displaced fracture of lateral malleolus of right fibula, initial encounter for closed fracture     Plan: Overall fracture is stable in alignment.  I think she is spending too much time on her feet.  The weightbearing should only be limited to essential ADLs.  This was reinforced today with her.  I would like to recheck her in 2 weeks for three-view x-rays of the right ankle.  We will take her out of work for now.  Follow-Up Instructions: Return in about 2 weeks (around 05/22/2019).   Orders:  Orders Placed This Encounter  Procedures  . XR Ankle Complete Right   No orders of the defined types were placed in this encounter.     Procedures: No procedures performed   Clinical Data: No additional findings.   Subjective: Chief Complaint  Patient presents with  . Right Ankle - Pain    Destiny Henderson follows up today for ankle fracture.  She states that she is been taking work by her supervisor that she has to be on her feet a lot.  Overall sounds like she has been doing a little too much physically.  She has been wearing an ASO brace.   Review of Systems   Objective: Vital Signs: There were no vitals taken for this visit.  Physical Exam  Ortho Exam Right ankle exam shows moderate swelling.  Neurovascular intact.  Mild tenderness over the fibula. Specialty Comments:  No specialty comments available.  Imaging: Xr Ankle Complete Right  Result Date: 05/08/2019 Stable alignment of minimally displaced fibula fracture.  Ankle mortise is congruent.    PMFS History: Patient Active Problem List   Diagnosis Date Noted  . Atypical chest pain 12/05/2018  . Dizziness  11/24/2018  . Low back pain radiating to right leg 02/17/2018  . Pedal edema 02/06/2018  . History of DVT (deep vein thrombosis) 02/06/2018  . Right wrist injury, subsequent encounter 01/24/2018  . Right knee injury, subsequent encounter 01/24/2018  . Sprain of MCL (medial collateral ligament) of knee 12/12/2016  . Sprain of right ankle 12/12/2016  . Menorrhagia 05/20/2013  . Essential hypertension, benign 03/03/2013  . Obesity 03/03/2013   Past Medical History:  Diagnosis Date  . Anemia   . Benign tumor of bladder   . DVT (deep venous thrombosis) ([place])   . Essential hypertension, benign 03/03/2013  . Fibroma of heart   . Obesity 03/03/2013    Family History  Problem Relation Age of Onset  . Breast cancer Mother   . Hypertension Mother   . Hyperlipidemia Mother   . Colon cancer Father   . Cancer Father   . Thyroid disease Sister   . Cancer Other   . Thyroid disease Sister   . Hypertension Maternal Aunt   . Heart failure Maternal Aunt   . Cancer Maternal Aunt   . Hypertension Maternal Aunt   . Hypertension Maternal Uncle   . Diabetes Maternal Uncle   . Heart failure Maternal Grandmother   . Cancer Maternal Grandfather  prostate    Past Surgical History:  Procedure Laterality Date  . BACK SURGERY    . bladder tack    . DILATION AND CURETTAGE OF UTERUS     after SAB  . HEART TUMOR EXCISION     Social History   Occupational History  . Occupation: Network engineer  Tobacco Use  . Smoking status: Never Smoker  . Smokeless tobacco: Never Used  Substance and Sexual Activity  . Alcohol use: No  . Drug use: No  . Sexual activity: Not on file

## 2019-05-09 ENCOUNTER — Other Ambulatory Visit: Payer: Self-pay | Admitting: Orthopedic Surgery

## 2019-05-09 MED ORDER — HYDROCODONE-ACETAMINOPHEN 5-325 MG PO TABS: 1.0000 | ORAL_TABLET | Freq: Four times a day (QID) | ORAL | 0 refills | Status: DC | PRN

## 2019-05-09 NOTE — Progress Notes (Signed)
Patient having pain in ankle.  Sounds like she had an ankle fracture treated by Dr. Sherrian Divers nonoperatively.  She was walking around and had increased pain and swelling.  I recommended she go to the ER for x-rays with likely fixation eminence only.  She wants to avoid that expense and therefore I recommended she go nonweightbearing back in the fracture boot and see Dr. Sherrian Divers on Monday.  I am going to call her in pain medicine until she can see him on Monday or Tuesday.

## 2019-05-12 ENCOUNTER — Encounter: Payer: Self-pay | Admitting: Advanced Practice Midwife

## 2019-05-12 ENCOUNTER — Other Ambulatory Visit: Payer: Self-pay

## 2019-05-12 ENCOUNTER — Ambulatory Visit (INDEPENDENT_AMBULATORY_CARE_PROVIDER_SITE_OTHER): Payer: 59 | Admitting: Advanced Practice Midwife

## 2019-05-12 VITALS — BP 134/82 | HR 73 | Ht 64.0 in | Wt 235.1 lb

## 2019-05-12 DIAGNOSIS — N898 Other specified noninflammatory disorders of vagina: Secondary | ICD-10-CM | POA: Diagnosis not present

## 2019-05-12 DIAGNOSIS — R1032 Left lower quadrant pain: Secondary | ICD-10-CM

## 2019-05-12 DIAGNOSIS — B9689 Other specified bacterial agents as the cause of diseases classified elsewhere: Secondary | ICD-10-CM

## 2019-05-12 DIAGNOSIS — R109 Unspecified abdominal pain: Secondary | ICD-10-CM

## 2019-05-12 DIAGNOSIS — Z113 Encounter for screening for infections with a predominantly sexual mode of transmission: Secondary | ICD-10-CM | POA: Diagnosis not present

## 2019-05-12 DIAGNOSIS — N760 Acute vaginitis: Secondary | ICD-10-CM

## 2019-05-12 DIAGNOSIS — Z3202 Encounter for pregnancy test, result negative: Secondary | ICD-10-CM | POA: Diagnosis not present

## 2019-05-12 DIAGNOSIS — N911 Secondary amenorrhea: Secondary | ICD-10-CM

## 2019-05-12 HISTORY — DX: Left lower quadrant pain: R10.32

## 2019-05-12 HISTORY — DX: Secondary amenorrhea: N91.1

## 2019-05-12 MED FILL — hydrOXYzine HCL 25 MG TABS: 25 | 3 days supply | Qty: 15 | Fill #0

## 2019-05-12 MED FILL — HYDROXYCHLOROQUINE 200 MG T: 200 | 30 days supply | Qty: 60 | Fill #0

## 2019-05-12 MED FILL — ONDANSETRON ODT 4 MG TABLET: 4 | 2 days supply | Qty: 9 | Fill #0

## 2019-05-12 NOTE — Progress Notes (Signed)
GYNECOLOGY CARE ENCOUNTER NOTE  Subjective:   Destiny Henderson is a 58 y.o. [phone] female here for a exam.  Current complaints: Left lower quadrant pain for one week. Ibuprofen helps some.     States stopped bleeding 3 years ago. Developed nausea and vomiting a few days ago and went to an ER in [place].  States they told her the Urine pregnancy test was positive.  Did not do any blood tests.  She received IV fluiids and an [name] which showed "something in my left tube".   Was told to followup with GYN.   Hx right tube and ovary removed for ectopic.   Has Liltetta IUD inserted 4 years ago.  Denies abnormal vaginal bleeding, discharge, or other gynecologic concerns.    Gynecologic History No LMP recorded. (Menstrual status: IUD).  Last cycle 3 yrs ago Contraception: IUD Last Pap: 2019. Results were: normal   Obstetric History OB History  Gravida Para Term Preterm AB Living  4 3 3   1 3   SAB TAB Ectopic Multiple Live Births  1            # Outcome Date GA Lbr Len/2nd Weight Sex Delivery Anes PTL Lv  4 SAB           3 Term      Vag-Spont     2 Term      Vag-Spont     1 Term      Vag-Spont       Past Medical History:  Diagnosis Date  . Anemia   . Benign tumor of bladder   . DVT (deep venous thrombosis) ([place])   . Essential hypertension, benign 03/03/2013  . Fibroma of heart   . Obesity 03/03/2013    Past Surgical History:  Procedure Laterality Date  . BACK SURGERY    . bladder tack    . DILATION AND CURETTAGE OF UTERUS     after SAB  . HEART TUMOR EXCISION      Current Outpatient Medications on File Prior to Visit  Medication Sig Dispense Refill  . acetaminophen (TYLENOL) 500 MG tablet Take 1 tablet (500 mg total) by mouth every 6 (six) hours as needed. 30 tablet 0  . aspirin EC 81 MG tablet Take 1 tablet (81 mg total) by mouth 2 (two) times daily. 42 tablet 0  . b complex vitamins capsule Take 1 capsule by mouth daily.    . Coenzyme Q10 (CO Q 10 PO) Take by mouth.    .  ECHINACEA PO Take by mouth.    . fish oil-omega-3 fatty acids 1000 MG capsule Take 2 g by mouth daily.    Marland Kitchen FLAXSEED, LINSEED, PO Take by mouth.    Marland Kitchen ibuprofen (ADVIL) 800 MG tablet Take 1 tablet (800 mg total) by mouth every 8 (eight) hours as needed. 30 tablet 2  . Melatonin 5 MG CAPS Take by mouth as needed.     . Multiple Vitamins-Minerals (MEGA MULTIVITAMIN FOR WOMEN) TABS Take by mouth.    . Pyridoxine HCl (VITAMIN B-6) 500 MG tablet Take 500 mg by mouth daily.    Marland Kitchen telmisartan-hydrochlorothiazide (MICARDIS HCT) 80-25 MG tablet Take 1 tablet by mouth daily. 90 tablet 1  . VITAMIN A OP Apply to eye.    . vitamin B-12 (CYANOCOBALAMIN) 1000 MCG tablet Take 1,000 mcg by mouth daily.    . vitamin C (ASCORBIC ACID) 500 MG tablet Take 500 mg by mouth daily.    Marland Kitchen VITAMIN  D, CHOLECALCIFEROL, PO Take by mouth.    . diltiazem (DILACOR XR) 240 MG 24 hr capsule Take 1 capsule (240 mg total) by mouth daily. (Patient not taking: Reported on 05/12/2019) 90 capsule 1  . furosemide (LASIX) 20 MG tablet Take 1 tablet (20 mg total) by mouth daily. 90 tablet 1  . HYDROcodone-acetaminophen (NORCO/VICODIN) 5-325 MG tablet Take 1 tablet by mouth every 6 (six) hours as needed for moderate pain. (Patient not taking: Reported on 05/12/2019) 30 tablet 0  . hydrOXYzine (ATARAX/VISTARIL) 25 MG tablet Take 1 tablet (25 mg total) by mouth every 6 (six) hours as needed for anxiety. (Patient not taking: Reported on 05/12/2019) 15 tablet 0  . ibuprofen (ADVIL) 800 MG tablet Take 1 tablet (800 mg total) by mouth 3 (three) times daily. (Patient not taking: Reported on 05/12/2019) 21 tablet 0  . methocarbamol (ROBAXIN) 750 MG tablet Take 1 tablet (750 mg total) by mouth 2 (two) times daily as needed for muscle spasms. (Patient not taking: Reported on 05/12/2019) 60 tablet 0  . promethazine (PHENERGAN) 25 MG tablet Take 1 tablet (25 mg total) by mouth every 6 (six) hours as needed for nausea or vomiting. (Patient not taking: Reported on  05/12/2019) 12 tablet 0   No current facility-administered medications on file prior to visit.     Allergies  Allergen Reactions  . Lisinopril Swelling    Angioedema   . Lasix [Furosemide] Rash and Other (See Comments)    Shortness of breath  . Latex Rash  . Prednisone Rash  . Sulfur Rash    Social History   Socioeconomic History  . Marital status: Married    Spouse name: Not on file  . Number of children: Not on file  . Years of education: Not on file  . Highest education level: Not on file  Occupational History  . Occupation: Network engineer  Social Needs  . Financial resource strain: Not on file  . Food insecurity    Worry: Not on file    Inability: Not on file  . Transportation needs    Medical: Not on file    Non-medical: Not on file  Tobacco Use  . Smoking status: Never Smoker  . Smokeless tobacco: Never Used  Substance and Sexual Activity  . Alcohol use: No  . Drug use: No  . Sexual activity: Not on file  Lifestyle  . Physical activity    Days per week: Not on file    Minutes per session: Not on file  . Stress: Not on file  Relationships  . Social Herbalist on phone: Not on file    Gets together: Not on file    Attends religious service: Not on file    Active member of club or organization: Not on file    Attends meetings of clubs or organizations: Not on file    Relationship status: Not on file  . Intimate partner violence    Fear of current or ex partner: Not on file    Emotionally abused: Not on file    Physically abused: Not on file    Forced sexual activity: Not on file  Other Topics Concern  . Not on file  Social History Narrative  . Not on file    Family History  Problem Relation Age of Onset  . Breast cancer Mother   . Hypertension Mother   . Hyperlipidemia Mother   . Colon cancer Father   . Cancer Father   . Thyroid  disease Sister   . Cancer Other   . Thyroid disease Sister   . Hypertension Maternal Aunt   . Heart failure  Maternal Aunt   . Cancer Maternal Aunt   . Hypertension Maternal Aunt   . Hypertension Maternal Uncle   . Diabetes Maternal Uncle   . Heart failure Maternal Grandmother   . Cancer Maternal Grandfather        prostate    The following portions of the patient's history were reviewed and updated as appropriate: allergies, current medications, past family history, past medical history, past social history, past surgical history and problem list.  Review of Systems Pertinent items noted in HPI and remainder of comprehensive ROS otherwise negative.   Objective:  BP 134/82   Pulse 73   Ht 5\' 4"  (1.626 m)   Wt 106.6 kg   BMI 40.35 kg/m  CONSTITUTIONAL: Well-developed, well-nourished female in no acute distress.  SKIN: Skin is warm and dry. No rash noted. Not diaphoretic.  [place]: Alert and oriented to person, place, and time.  PSYCHIATRIC: Normal mood and affect. Normal behavior. Normal judgment and thought content. CARDIOVASCULAR: Normal heart rate noted, regular rhythm RESPIRATORY: Effort normal, no problems with respiration noted. ABDOMEN: Soft, no distention noted.  Tender over LLQ, slight guarding  PELVIC: Normal appearing external genitalia; normal appearing vaginal mucosa and cervix.  No abnormal discharge noted.  .  Normal uterine size, no other palpable masses, difficult exam due to habitus   IUD string present Left adnexa is tender   Assessment:  Left lower quadrant pain Questionable pregnancy test in ER, neg here Probable menopausal status IUD in place   Plan:  Labs ordered, CBC, FSH, Quant HCG [name] ordered of pelvis to include vaginal .  Unable to see results from ER visit  Will follow up results of tests and manage accordingly. Ibuprofen for pain.  Declines Tramadol Routine preventative health maintenance measures emphasized. Please refer to After Visit Summary for other counseling recommendations.    Jasie Meleski L, [place]  for [facility]

## 2019-05-12 NOTE — Patient Instructions (Signed)
Menopause Menopause is the normal time of life when menstrual periods stop completely. It is usually confirmed by 12 months without a menstrual period. The transition to menopause (perimenopause) most often happens between the ages of 1 and 13. During perimenopause, hormone levels change in your body, which can cause symptoms and affect your health. Menopause may increase your risk for:  Loss of bone (osteoporosis), which causes bone breaks (fractures).  Depression.  Hardening and narrowing of the arteries (atherosclerosis), which can cause heart attacks and strokes. What are the causes? This condition is usually caused by a natural change in hormone levels that happens as you get older. The condition may also be caused by surgery to remove both ovaries (bilateral oophorectomy). What increases the risk? This condition is more likely to start at an earlier age if you have certain medical conditions or treatments, including:  A tumor of the pituitary gland in the brain.  A disease that affects the ovaries and hormone production.  Radiation treatment for cancer.  Certain cancer treatments, such as chemotherapy or hormone (anti-estrogen) therapy.  Heavy smoking and excessive alcohol use.  Family history of early menopause. This condition is also more likely to develop earlier in women who are very thin. What are the signs or symptoms? Symptoms of this condition include:  Hot flashes.  Irregular menstrual periods.  Night sweats.  Changes in feelings about sex. This could be a decrease in sex drive or an increased comfort around your sexuality.  Vaginal dryness and thinning of the vaginal walls. This may cause painful intercourse.  Dryness of the skin and development of wrinkles.  Headaches.  Problems sleeping (insomnia).  Mood swings or irritability.  Memory problems.  Weight gain.  Hair growth on the face and chest.  Bladder infections or problems with urinating. How  is this diagnosed? This condition is diagnosed based on your medical history, a physical exam, your age, your menstrual history, and your symptoms. Hormone tests may also be done. How is this treated? In some cases, no treatment is needed. You and your health care provider should make a decision together about whether treatment is necessary. Treatment will be based on your individual condition and preferences. Treatment for this condition focuses on managing symptoms. Treatment may include:  Menopausal hormone therapy (MHT).  Medicines to treat specific symptoms or complications.  Acupuncture.  Vitamin or herbal supplements. Before starting treatment, make sure to let your health care provider know if you have a personal or family history of:  Heart disease.  Breast cancer.  Blood clots.  Diabetes.  Osteoporosis. Follow these instructions at home: Lifestyle  Do not use any products that contain nicotine or tobacco, such as cigarettes and e-cigarettes. If you need help quitting, ask your health care provider.  Get at least 30 minutes of physical activity on 5 or more days each week.  Avoid alcoholic and caffeinated beverages, as well as spicy foods. This may help prevent hot flashes.  Get 7-8 hours of sleep each night.  If you have hot flashes, try: ? Dressing in layers. ? Avoiding things that may trigger hot flashes, such as spicy food, warm places, or stress. ? Taking slow, deep breaths when a hot flash starts. ? Keeping a fan in your home and office.  Find ways to manage stress, such as deep breathing, meditation, or journaling.  Consider going to group therapy with other women who are having menopause symptoms. Ask your health care provider about recommended group therapy meetings. Eating and  drinking  Eat a healthy, balanced diet that contains whole grains, lean protein, low-fat dairy, and plenty of fruits and vegetables.  Your health care provider may recommend  adding more soy to your diet. Foods that contain soy include tofu, tempeh, and soy milk.  Eat plenty of foods that contain calcium and vitamin D for bone health. Items that are rich in calcium include low-fat milk, yogurt, beans, almonds, sardines, broccoli, and kale. Medicines  Take over-the-counter and prescription medicines only as told by your health care provider.  Talk with your health care provider before starting any herbal supplements. If prescribed, take vitamins and supplements as told by your health care provider. These may include: ? Calcium. Women age 37 and older should get 1,200 mg (milligrams) of calcium every day. ? Vitamin D. Women need 600-800 International Units of vitamin D each day. ? Vitamins B12 and B6. Aim for 50 micrograms of B12 and 1.5 mg of B6 each day. General instructions  Keep track of your menstrual periods, including: ? When they occur. ? How heavy they are and how long they last. ? How much time passes between periods.  Keep track of your symptoms, noting when they start, how often you have them, and how long they last.  Use vaginal lubricants or moisturizers to help with vaginal dryness and improve comfort during sex.  Keep all follow-up visits as told by your health care provider. This is important. This includes any group therapy or counseling. Contact a health care provider if:  You are still having menstrual periods after age 70.  You have pain during sex.  You have not had a period for 12 months and you develop vaginal bleeding. Get help right away if:  You have: ? Severe depression. ? Excessive vaginal bleeding. ? Pain when you urinate. ? A fast or irregular heart beat (palpitations). ? Severe headaches. ? Abdomen (abdominal) pain or severe indigestion.  You fell and you think you have a broken bone.  You develop leg or chest pain.  You develop vision problems.  You feel a lump in your breast. Summary  Menopause is the normal  time of life when menstrual periods stop completely. It is usually confirmed by 12 months without a menstrual period.  The transition to menopause (perimenopause) most often happens between the ages of 39 and 62.  Symptoms can be managed through medicines, lifestyle changes, and complementary therapies such as acupuncture.  Eat a balanced diet that is rich in nutrients to promote bone health and heart health and to manage symptoms during menopause. This information is not intended to replace advice given to you by your health care provider. Make sure you discuss any questions you have with your health care provider. Document Released: 12/15/2003 Document Revised: 09/06/2017 Document Reviewed: 10/27/2016 Elsevier Patient Education  [employer]. Pelvic Pain, Female Pelvic pain is pain in your lower abdomen, below your belly button and between your hips. The pain may start suddenly (be acute), keep coming back (be recurring), or last a long time (become chronic). Pelvic pain that lasts longer than 6 months is considered chronic. Pelvic pain may affect your:  Reproductive organs.  Urinary system.  Digestive tract.  Musculoskeletal system. There are many potential causes of pelvic pain. Sometimes, the pain can be a result of digestive or urinary conditions, strained muscles or ligaments, or reproductive conditions. Sometimes the cause of pelvic pain is not known. Follow these instructions at home:   Take over-the-counter and prescription medicines only  as told by your health care provider.  Rest as told by your health care provider.  Do not have sex if it hurts.  Keep a journal of your pelvic pain. Write down: ? When the pain started. ? Where the pain is located. ? What seems to make the pain better or worse, such as food or your period (menstrual cycle). ? Any symptoms you have along with the pain.  Keep all follow-up visits as told by your health care provider. This is  important. Contact a health care provider if:  Medicine does not help your pain.  Your pain comes back.  You have new symptoms.  You have abnormal vaginal discharge or bleeding, including bleeding after menopause.  You have a fever or chills.  You are constipated.  You have blood in your urine or stool.  You have foul-smelling urine.  You feel weak or light-headed. Get help right away if:  You have sudden severe pain.  Your pain gets steadily worse.  You have severe pain along with fever, nausea, vomiting, or excessive sweating.  You lose consciousness. Summary  Pelvic pain is pain in your lower abdomen, below your belly button and between your hips.  There are many potential causes of pelvic pain.  Keep a journal of your pelvic pain. This information is not intended to replace advice given to you by your health care provider. Make sure you discuss any questions you have with your health care provider. Document Released: 08/21/2004 Document Revised: 03/12/2018 Document Reviewed: 03/12/2018 Elsevier Patient Education  2020 [employer].

## 2019-05-13 LAB — POCT URINE PREGNANCY: Preg Test, Ur: NEGATIVE

## 2019-05-13 LAB — CBC
Hematocrit: 36.5 % (ref 34.0–46.6)
Hemoglobin: 11.9 g/dL (ref 11.1–15.9)
MCH: 27.8 pg (ref 26.6–33.0)
MCHC: 32.6 g/dL (ref 31.5–35.7)
MCV: 85 fL (ref 79–97)
Platelets: 417 10*3/uL (ref 150–450)
RBC: 4.28 x10E6/uL (ref 3.77–5.28)
RDW: 14.1 % (ref 11.7–15.4)
WBC: 7.3 10*3/uL (ref 3.4–10.8)

## 2019-05-13 LAB — BETA HCG QUANT (REF LAB): hCG Quant: 1 m[IU]/mL

## 2019-05-13 LAB — FOLLICLE STIMULATING HORMONE: FSH: 42.6 m[IU]/mL

## 2019-05-14 ENCOUNTER — Telehealth: Payer: Self-pay

## 2019-05-14 NOTE — Telephone Encounter (Signed)
Pt called the office requesting lab results. Pt made aware that labs were WNL. Pt also made aware that if any labs are abnormal she will receive a call. Understanding was voiced.  Maurianna Benard l Takasha Vetere, CMA

## 2019-05-15 LAB — CERVICOVAGINAL ANCILLARY ONLY
Bacterial vaginitis: POSITIVE — AB
Candida vaginitis: NEGATIVE
Chlamydia: NEGATIVE
Neisseria Gonorrhea: NEGATIVE
Trichomonas: NEGATIVE

## 2019-05-16 ENCOUNTER — Emergency Department (HOSPITAL_BASED_OUTPATIENT_CLINIC_OR_DEPARTMENT_OTHER): Payer: 59

## 2019-05-16 ENCOUNTER — Other Ambulatory Visit: Payer: Self-pay

## 2019-05-16 ENCOUNTER — Emergency Department (HOSPITAL_BASED_OUTPATIENT_CLINIC_OR_DEPARTMENT_OTHER)
Admission: EM | Admit: 2019-05-16 | Discharge: 2019-05-16 | Disposition: A | Discharge status: Home / Self Care | Payer: 59 | Attending: Emergency Medicine | Admitting: Emergency Medicine

## 2019-05-16 ENCOUNTER — Encounter (HOSPITAL_BASED_OUTPATIENT_CLINIC_OR_DEPARTMENT_OTHER): Payer: Self-pay | Admitting: Emergency Medicine

## 2019-05-16 DIAGNOSIS — I10 Essential (primary) hypertension: Secondary | ICD-10-CM | POA: Insufficient documentation

## 2019-05-16 DIAGNOSIS — W19XXXD Unspecified fall, subsequent encounter: Secondary | ICD-10-CM | POA: Diagnosis not present

## 2019-05-16 DIAGNOSIS — Z79899 Other long term (current) drug therapy: Secondary | ICD-10-CM | POA: Diagnosis not present

## 2019-05-16 DIAGNOSIS — Z7982 Long term (current) use of aspirin: Secondary | ICD-10-CM | POA: Insufficient documentation

## 2019-05-16 DIAGNOSIS — Z86718 Personal history of other venous thrombosis and embolism: Secondary | ICD-10-CM | POA: Diagnosis not present

## 2019-05-16 DIAGNOSIS — S82831D Other fracture of upper and lower end of right fibula, subsequent encounter for closed fracture with routine healing: Secondary | ICD-10-CM | POA: Insufficient documentation

## 2019-05-16 DIAGNOSIS — Z888 Allergy status to other drugs, medicaments and biological substances status: Secondary | ICD-10-CM | POA: Insufficient documentation

## 2019-05-16 DIAGNOSIS — E782 Mixed hyperlipidemia: Secondary | ICD-10-CM | POA: Diagnosis not present

## 2019-05-16 DIAGNOSIS — Z91040 Latex allergy status: Secondary | ICD-10-CM | POA: Insufficient documentation

## 2019-05-16 DIAGNOSIS — S82831A Other fracture of upper and lower end of right fibula, initial encounter for closed fracture: Secondary | ICD-10-CM

## 2019-05-16 DIAGNOSIS — M25571 Pain in right ankle and joints of right foot: Secondary | ICD-10-CM | POA: Diagnosis present

## 2019-05-16 MED ORDER — OXYCODONE-ACETAMINOPHEN 5-325 MG PO TABS: 1.0000 | ORAL_TABLET | Freq: Four times a day (QID) | ORAL | 0 refills | Status: DC | PRN

## 2019-05-16 NOTE — ED Triage Notes (Signed)
Pt here after a right leg fracture that was beginning to heal. Stepped out of her vehicle and stepped weird and now is in 10/10 pain.. Pt did drive herself here today.

## 2019-05-16 NOTE — ED Provider Notes (Signed)
[facility] EM[facility]GENCY DEPART[facility] Provider Note   CSN: [id number] Arrival date & time: 05/16/19  1748     History   Chief Complaint Chief Complaint  Patient presents with  . Ankle Pain    HPI Destiny Henderson is a 58 y.o. female.  She is recovering from a right distal fibular fracture from a fall about a month ago.  She said she followed up with Dr. Erlinda Hong and was treated in a walking boot and is now using a walker with a brace.  She said today she was stepping out of the car and twisted her ankle and is now in severe pain again.  No fall no other injuries or complaints.     The history is provided by the patient.  Ankle Pain Location:  Ankle Time since incident:  1 hour Injury: yes   Ankle location:  R ankle Pain details:    Quality:  Throbbing   Radiates to:  Does not radiate   Severity:  Moderate   Onset quality:  Sudden   Timing:  Constant   Progression:  Unchanged Chronicity:  Recurrent Relieved by:  Nothing Worsened by:  Bearing weight Ineffective treatments:  None tried Associated symptoms: decreased ROM and swelling   Associated symptoms: no back pain, no fever, no numbness and no tingling     Past Medical History:  Diagnosis Date  . Anemia   . Benign tumor of bladder   . DVT (deep venous thrombosis) ([place])   . Essential hypertension, benign 03/03/2013  . Fibroma of heart   . Obesity 03/03/2013    Patient Active Problem List   Diagnosis Date Noted  . LLQ pain 05/12/2019  . Amenorrhea, secondary 05/12/2019  . Dizziness 11/24/2018  . Atypical chest pain 05/21/2018  . History of iron deficiency anemia 05/21/2018  . Mixed hyperlipidemia 05/21/2018  . Low back pain radiating to right leg 02/17/2018  . Pedal edema 02/06/2018  . History of DVT (deep vein thrombosis) 02/06/2018  . Right wrist injury, subsequent encounter 01/24/2018  . Right knee injury, subsequent encounter 01/24/2018  . Sprain of MCL (medial collateral ligament) of knee  12/12/2016  . Sprain of right ankle 12/12/2016  . Menorrhagia 05/20/2013  . Essential hypertension, benign 03/03/2013  . Obesity 03/03/2013    Past Surgical History:  Procedure Laterality Date  . BACK SURGERY    . bladder tack    . DILATION AND CURETTAGE OF UTERUS     after SAB  . HEART TUMOR EXCISION       OB History    Gravida  4   Para  3   Term  3   Preterm      AB  1   Living  3     SAB  1   TAB      Ectopic      Multiple      Live Births               Home Medications    Prior to Admission medications   Medication Sig Start Date End Date Taking? Authorizing Provider  acetaminophen (TYLENOL) 500 MG tablet Take 1 tablet (500 mg total) by mouth every 6 (six) hours as needed. 01/21/18   Law, Bea Graff, PA-C  aspirin EC 81 MG tablet Take 1 tablet (81 mg total) by mouth 2 (two) times daily. 04/29/19   Leandrew Koyanagi, MD  b complex vitamins capsule Take 1 capsule by mouth daily.  [provider]  Coenzyme Q10 (CO Q 10 PO) Take by mouth.    [provider]  diltiazem (DILACOR XR) 240 MG 24 hr capsule Take 1 capsule (240 mg total) by mouth daily. Patient not taking: Reported on 05/12/2019 01/15/19   Park Liter, MD  ECHINACEA PO Take by mouth.    [provider]  fish oil-omega-3 fatty acids 1000 MG capsule Take 2 g by mouth daily.    [provider]  Flaxseed, Linseed, 1000 MG CAPS Take by mouth.    [provider]  FLAXSEED, LINSEED, PO Take by mouth.    [provider]  furosemide (LASIX) 20 MG tablet Take 1 tablet (20 mg total) by mouth daily. 01/16/19 04/16/19  Park Liter, MD  HYDROcodone-acetaminophen (NORCO/VICODIN) 5-325 MG tablet Take 1 tablet by mouth every 6 (six) hours as needed for moderate pain. Patient not taking: Reported on 05/12/2019 05/09/19   Meredith Pel, MD  hydrOXYzine (ATARAX/VISTARIL) 25 MG tablet Take 1 tablet (25 mg total) by mouth every 6 (six) hours as needed  for anxiety. Patient not taking: Reported on 05/12/2019 12/25/18   Maudie Flakes, MD  ibuprofen (ADVIL) 800 MG tablet Take 1 tablet (800 mg total) by mouth 3 (three) times daily. Patient not taking: Reported on 05/12/2019 04/19/19   Carlisle Cater, PA-C  ibuprofen (ADVIL) 800 MG tablet Take 1 tablet (800 mg total) by mouth every 8 (eight) hours as needed. 04/29/19   Leandrew Koyanagi, MD  Melatonin 5 MG CAPS Take by mouth as needed.     [provider]  methocarbamol (ROBAXIN) 750 MG tablet Take 1 tablet (750 mg total) by mouth 2 (two) times daily as needed for muscle spasms. Patient not taking: Reported on 05/12/2019 04/29/19   Leandrew Koyanagi, MD  Multiple Vitamins-Minerals (MEGA MULTIVITAMIN FOR WOMEN) TABS Take by mouth.    [provider]  promethazine (PHENERGAN) 25 MG tablet Take 1 tablet (25 mg total) by mouth every 6 (six) hours as needed for nausea or vomiting. Patient not taking: Reported on 05/12/2019 06/12/18   Margarita Mail, PA-C  Pyridoxine HCl (VITAMIN B-6) 500 MG tablet Take 500 mg by mouth daily.    [provider]  telmisartan-hydrochlorothiazide (MICARDIS HCT) 80-25 MG tablet Take 1 tablet by mouth daily. 01/15/19   Park Liter, MD  VITAMIN A OP Apply to eye.    [provider]  vitamin B-12 (CYANOCOBALAMIN) 1000 MCG tablet Take 1,000 mcg by mouth daily.    [provider]  vitamin C (ASCORBIC ACID) 500 MG tablet Take 500 mg by mouth daily.    [provider]  VITAMIN D, CHOLECALCIFEROL, PO Take by mouth.    [provider]    Family History Family History  Problem Relation Age of Onset  . Breast cancer Mother   . Hypertension Mother   . Hyperlipidemia Mother   . Colon cancer Father   . Cancer Father   . Thyroid disease Sister   . Cancer Other   . Thyroid disease Sister   . Hypertension Maternal Aunt   . Heart failure Maternal Aunt   . Cancer Maternal Aunt   . Hypertension Maternal Aunt   . Hypertension  Maternal Uncle   . Diabetes Maternal Uncle   . Heart failure Maternal Grandmother   . Cancer Maternal Grandfather        prostate    Social History Social History   Tobacco Use  . Smoking status:  Never Smoker  . Smokeless tobacco: Never Used  Substance Use Topics  . Alcohol use: No  . Drug use: No     Allergies   Latex, Lisinopril, Lasix [furosemide], Prednisone, and Sulfur   Review of Systems Review of Systems  Constitutional: Negative for fever.  Respiratory: Negative for shortness of breath.   Cardiovascular: Negative for chest pain.  Gastrointestinal: Negative for abdominal pain.  Musculoskeletal: Negative for back pain.  Skin: Negative for wound.     Physical Exam Updated Vital Signs BP (!) 169/111 (BP Location: Right Arm)   Pulse 84   Temp 98.5 F (36.9 C) (Oral)   Resp 20   SpO2 99%   Physical Exam Constitutional:      Appearance: She is well-developed.  HENT:     Head: Normocephalic and atraumatic.  Eyes:     Conjunctiva/sclera: Conjunctivae normal.  Neck:     Musculoskeletal: Neck supple.  Musculoskeletal:        General: Tenderness and signs of injury present.     Comments: She has some swelling and tenderness of her lateral malleolus on the right.  There is no medial malleolar tenderness or fifth metatarsal tenderness or proximal tib-fib tenderness.  No midfoot pain.  No open wounds.  Distal motor and sensory intact.  Cap refill brisk.  Other extremities unaffected.  Skin:    General: Skin is warm and dry.     Capillary Refill: Capillary refill takes less than 2 seconds.  Neurological:     General: No focal deficit present.     Mental Status: She is alert and oriented to person, place, and time.     GCS: GCS eye subscore is 4. GCS verbal subscore is 5. GCS motor subscore is 6.      ED Treatments / Results  Labs (all labs ordered are listed, but only abnormal results are displayed) Labs Reviewed - No data to display  EKG None   Radiology Dg Ankle Complete Right  Result Date: 05/16/2019 CLINICAL DATA:  Known right fibular fracture since July 12 two thousand twenty. Re-injury today. EXAM: RIGHT ANKLE - COMPLETE 3+ VIEW COMPARISON:  Multiple x-rays since April 19, 2019 FINDINGS: There is a fracture through the distal fibula again identified. The finding is similar in appearance since May 08, 2019. The fracture is not yet healed without significant callus formation. There is soft tissue swelling, particularly laterally, may be mildly increased compared to May 08, 2019. The ankle mortise is intact. No other abnormalities. IMPRESSION: Continued fracture through the distal fibula without significant callus formation. The findings are similar in appearance since May 08, 2019. The lateral soft tissue swelling may be mildly increased. Electronically Signed   By: Dorise Bullion III M.D   On: 05/16/2019 18:32    Procedures Procedures (including critical care time)  Medications Ordered in ED Medications - No data to display   Initial Impression / Assessment and Plan / ED Course  I have reviewed the triage vital signs and the nursing notes.  Pertinent labs & imaging results that were available during my care of the patient were reviewed by me and considered in my medical decision making (see chart for details).  Clinical Course as of May 15 1921  Sat May 16, 2019  1830 Differential diagnosis includes refracture of ankle, ankle sprain, dislocation.    [MB]  2951 Reviewed the patient's x-rays and she does have a lateral mall fracture but it appears similar to x-rays from about a week ago.  Radiology also reviewed the films and read them as similar but maybe with some increased soft tissue swelling.  We will have the patient return back into the boot and follow-up with her orthopedist.  On review of the notes from Dr. Erlinda Hong he notes that there has not been a lot of healing progress and wonders if she is been doing too much on her  feet.   [MB]    Clinical Course User Index [MB] Hayden Rasmussen, MD        Final Clinical Impressions(s) / ED Diagnoses   Final diagnoses:  Closed fracture of distal end of right fibula, unspecified fracture morphology, initial encounter    ED Discharge Orders         Ordered    oxyCODONE-acetaminophen (PERCOCET/ROXICET) 5-325 MG tablet  Every 6 hours PRN     05/16/19 1848           Hayden Rasmussen, MD 05/16/19 [id number]

## 2019-05-16 NOTE — Discharge Instructions (Addendum)
You were seen in the emergency department for evaluation of right ankle pain after reinjuring it today.  Your x-rays again show your distal fibular fracture that does not seem to be different than other x-rays from a week ago.  It will be important that you go back into your cam boot and use ice and elevation.  Please contact your orthopedic doctor for close follow-up.  Return if any concerns.

## 2019-05-18 ENCOUNTER — Telehealth: Payer: Self-pay

## 2019-05-18 DIAGNOSIS — B379 Candidiasis, unspecified: Secondary | ICD-10-CM

## 2019-05-18 DIAGNOSIS — B9689 Other specified bacterial agents as the cause of diseases classified elsewhere: Secondary | ICD-10-CM

## 2019-05-18 MED ORDER — FLUCONAZOLE 150 MG PO TABS: 150.0000 mg | ORAL_TABLET | Freq: Once | ORAL | 0 refills | Status: AC

## 2019-05-18 MED ORDER — METRONIDAZOLE 500 MG PO TABS: 500.0000 mg | ORAL_TABLET | Freq: Two times a day (BID) | ORAL | 0 refills | Status: DC

## 2019-05-18 NOTE — Telephone Encounter (Signed)
Called pt regarding wet prep results. Pt made aware that wet prep was positive for BV. Medication was sent to pharmacy. Pt states that antibiotics causes her to catch a yeast infection and requests Diflucan Rx. Flagyl and Diflucan was sent to pharmacy. Understanding was voiced.  Garrie Woodin l Ketrick Matney, CMA

## 2019-05-21 ENCOUNTER — Other Ambulatory Visit: Payer: Self-pay

## 2019-05-21 ENCOUNTER — Encounter (HOSPITAL_BASED_OUTPATIENT_CLINIC_OR_DEPARTMENT_OTHER): Payer: Self-pay

## 2019-05-21 ENCOUNTER — Telehealth: Payer: Self-pay | Admitting: Cardiology

## 2019-05-21 ENCOUNTER — Emergency Department (HOSPITAL_BASED_OUTPATIENT_CLINIC_OR_DEPARTMENT_OTHER)
Admission: EM | Admit: 2019-05-21 | Discharge: 2019-05-21 | Disposition: A | Discharge status: Home / Self Care | Payer: 59 | Attending: Emergency Medicine | Admitting: Emergency Medicine

## 2019-05-21 ENCOUNTER — Emergency Department (HOSPITAL_BASED_OUTPATIENT_CLINIC_OR_DEPARTMENT_OTHER): Payer: 59

## 2019-05-21 ENCOUNTER — Telehealth: Payer: Self-pay | Admitting: Orthopaedic Surgery

## 2019-05-21 ENCOUNTER — Telehealth: Payer: Self-pay

## 2019-05-21 DIAGNOSIS — Z20822 Contact with and (suspected) exposure to covid-19: Secondary | ICD-10-CM

## 2019-05-21 DIAGNOSIS — Z91040 Latex allergy status: Secondary | ICD-10-CM | POA: Insufficient documentation

## 2019-05-21 DIAGNOSIS — R05 Cough: Secondary | ICD-10-CM | POA: Diagnosis not present

## 2019-05-21 DIAGNOSIS — R51 Headache: Secondary | ICD-10-CM | POA: Diagnosis not present

## 2019-05-21 DIAGNOSIS — R110 Nausea: Secondary | ICD-10-CM | POA: Diagnosis not present

## 2019-05-21 DIAGNOSIS — Z7982 Long term (current) use of aspirin: Secondary | ICD-10-CM | POA: Insufficient documentation

## 2019-05-21 DIAGNOSIS — I10 Essential (primary) hypertension: Secondary | ICD-10-CM | POA: Diagnosis not present

## 2019-05-21 DIAGNOSIS — Z20828 Contact with and (suspected) exposure to other viral communicable diseases: Secondary | ICD-10-CM | POA: Diagnosis not present

## 2019-05-21 DIAGNOSIS — Z79899 Other long term (current) drug therapy: Secondary | ICD-10-CM | POA: Diagnosis not present

## 2019-05-21 DIAGNOSIS — R5383 Other fatigue: Secondary | ICD-10-CM | POA: Insufficient documentation

## 2019-05-21 DIAGNOSIS — R509 Fever, unspecified: Secondary | ICD-10-CM

## 2019-05-21 DIAGNOSIS — R059 Cough, unspecified: Secondary | ICD-10-CM

## 2019-05-21 LAB — CBC WITH DIFFERENTIAL/PLATELET
Abs Immature Granulocytes: 0.01 10*3/uL (ref 0.00–0.07)
Basophils Absolute: 0 10*3/uL (ref 0.0–0.1)
Basophils Relative: 1 %
Eosinophils Absolute: 0.3 10*3/uL (ref 0.0–0.5)
Eosinophils Relative: 5 %
HCT: 40.2 % (ref 36.0–46.0)
Hemoglobin: 12.3 g/dL (ref 12.0–15.0)
Immature Granulocytes: 0 %
Lymphocytes Relative: 39 %
Lymphs Abs: 2.3 10*3/uL (ref 0.7–4.0)
MCH: 26.7 pg (ref 26.0–34.0)
MCHC: 30.6 g/dL (ref 30.0–36.0)
MCV: 87.2 fL (ref 80.0–100.0)
Monocytes Absolute: 0.6 10*3/uL (ref 0.1–1.0)
Monocytes Relative: 10 %
Neutro Abs: 2.7 10*3/uL (ref 1.7–7.7)
Neutrophils Relative %: 45 %
Platelets: 390 10*3/uL (ref 150–400)
RBC: 4.61 MIL/uL (ref 3.87–5.11)
RDW: 15.1 % (ref 11.5–15.5)
WBC: 5.9 10*3/uL (ref 4.0–10.5)
nRBC: 0 % (ref 0.0–0.2)

## 2019-05-21 LAB — COMPREHENSIVE METABOLIC PANEL
ALT: 15 U/L (ref 0–44)
AST: 19 U/L (ref 15–41)
Albumin: 3.8 g/dL (ref 3.5–5.0)
Alkaline Phosphatase: 71 U/L (ref 38–126)
Anion gap: 8 (ref 5–15)
BUN: 11 mg/dL (ref 6–20)
CO2: 25 mmol/L (ref 22–32)
Calcium: 9.2 mg/dL (ref 8.9–10.3)
Chloride: 103 mmol/L (ref 98–111)
Creatinine, Ser: 0.99 mg/dL (ref 0.44–1.00)
GFR calc Af Amer: >60 mL/min (ref >60)
GFR calc non Af Amer: >60 mL/min (ref >60)
Glucose, Bld: 91 mg/dL (ref 70–99)
Potassium: 4.2 mmol/L (ref 3.5–5.1)
Sodium: 136 mmol/L (ref 135–145)
Total Bilirubin: 0.5 mg/dL (ref 0.3–1.2)
Total Protein: 7.5 g/dL (ref 6.5–8.1)

## 2019-05-21 LAB — TROPONIN I (HIGH SENSITIVITY)
Troponin I (High Sensitivity): 2 ng/L (ref <18)
Troponin I (High Sensitivity): 3 ng/L (ref <18)

## 2019-05-21 LAB — SARS CORONAVIRUS 2 (TAT 6-24 HRS): SARS Coronavirus 2: NEGATIVE

## 2019-05-21 MED ORDER — SODIUM CHLORIDE 0.9 % IV BOLUS
1000.0000 mL | Freq: Once | INTRAVENOUS | Status: AC
  Administered 2019-05-21: 1000 mL via INTRAVENOUS

## 2019-05-21 MED ORDER — ONDANSETRON 4 MG PO TBDP: 4.0000 mg | ORAL_TABLET | Freq: Three times a day (TID) | ORAL | 0 refills | Status: DC | PRN

## 2019-05-21 MED ORDER — ONDANSETRON HCL 4 MG/2ML IJ SOLN
4.0000 mg | Freq: Once | INTRAMUSCULAR | Status: AC
  Administered 2019-05-21: 4 mg via INTRAVENOUS
  Filled 2019-05-21: qty 2

## 2019-05-21 MED ORDER — ACETAMINOPHEN 500 MG PO TABS
1000.0000 mg | ORAL_TABLET | Freq: Once | ORAL | Status: AC
  Administered 2019-05-21: 1000 mg via ORAL
  Filled 2019-05-21: qty 2

## 2019-05-21 MED ORDER — METHOCARBAMOL 500 MG PO TABS: 500.0000 mg | ORAL_TABLET | Freq: Two times a day (BID) | ORAL | 0 refills | Status: DC | PRN

## 2019-05-21 MED FILL — ONDANSETRON ODT 4 MG TABLET: 4 | 3 days supply | Qty: 9 | Fill #0

## 2019-05-21 MED FILL — METHOCARBAMOL 500 MG TABLET: 500 | 6 days supply | Qty: 12 | Fill #0

## 2019-05-21 NOTE — ED Triage Notes (Addendum)
Pt reports cough, sore throat, shortness of breath and body aches for past 2 days. States took ibuprofen at 10:30 this morning. Temp max at home 99.  Reports body aches, headache for past 2 days. Pt states was notified today that she has been in contact with someone suspected to have covid last Saturday.

## 2019-05-21 NOTE — Telephone Encounter (Signed)
IC advised per Dr Erlinda Hong

## 2019-05-21 NOTE — ED Notes (Signed)
Assisted to bedside commode

## 2019-05-21 NOTE — Telephone Encounter (Signed)
Tramadol #20.  1-2 bid prn pain

## 2019-05-21 NOTE — ED Notes (Signed)
Provider at bedside

## 2019-05-21 NOTE — ED Notes (Signed)
Assisted with bedside commode 

## 2019-05-21 NOTE — Telephone Encounter (Signed)
Do you want to call and discuss over the phone or just have them make an appointment for after results return.

## 2019-05-21 NOTE — Telephone Encounter (Signed)
Patient returned call and advised she did not want rx for ultram because it did not work.

## 2019-05-21 NOTE — Telephone Encounter (Signed)
Tried calling to advise per Dr Erlinda Hong and to clarify what pharmacy wanted medication sent to. No answer. LMVM advising to call back to advise on pharmacy.

## 2019-05-21 NOTE — Discharge Instructions (Addendum)
It was my pleasure taking care of you today!   Fortunately, your chest x-ray was normal today and your lab work looked great.   We have tested you for coronavirus. You will get a call if your results are positive.  You can always log into my chart to see your results.   Tylenol 650mg -1000mg  every 6-8 hours as needed for fever / body aches.  Zofran as needed for nausea.   Call your primary care doctor to schedule a follow up appointment - I would anticipate them wanting to do a tele-health or phone call check in.   Return to ER if your shortness of breath is getting worse, new or worsening symptoms develop or you have any additional concerns.

## 2019-05-21 NOTE — Telephone Encounter (Signed)
Patient called advised she is waiting on the results of her covid-19 test because she has been exposed to someone with the covid-19 virus. Patient asked if she can get a virtual or telephone visit Monday or another day. Patient advised her ankle is not getting any better and is swollen. The number to contact patient is [phone]

## 2019-05-21 NOTE — Telephone Encounter (Signed)
Make appt after covid results.  Thanks.

## 2019-05-21 NOTE — Telephone Encounter (Signed)
IC s/w patient she would like to know what should she do about the pain and swelling in the meantime. Are you going to give her something for pain? She said she was in ER 1 week ago and they did xrays then.

## 2019-05-21 NOTE — ED Provider Notes (Signed)
[facility] EMERGENCY DEPARTMENT Provider Note   CSN: [id number] Arrival date & time: 05/21/19  1229    History   Chief Complaint Chief Complaint  Patient presents with  . Cough  . Nausea    HPI Destiny Henderson is a 58 y.o. female.     The history is provided by medical records and the patient. No language interpreter was used.   Destiny Henderson is a 58 y.o. female  with a PMH of HTN, HLD, obesity, previous provoked DVT 2/2 travel several years ago no longer on anticoagulation but does take 81 mg asa who presents to the Emergency Department complaining of headache, nausea and fatigue that began 2 days ago.  Yesterday, she developed cough, body aches, mild sore throat and shortness of breath.  She has been having some intermittent central chest tightness associated with her shortness of breath.  She has 16 steps to get to her home and getting up and is usually not much of a problem.  She did make it up her steps today, but was extremely out of breath at the top and that is very unusual for her.  She had to take a break and rest until she could continue walking into the room.  She has been monitoring her temperature and reports low-grade temps in the 99's for the last 2 days.  Last night, she had a temperature of 100.2.  She did take an ibuprofen this morning which helped somewhat with her symptoms.  A friend just informed her this morning that they tested positive for coronavirus.  She was in a contained room with them on Friday the seventh.   Past Medical History:  Diagnosis Date  . Anemia   . Benign tumor of bladder   . DVT (deep venous thrombosis) ([place])   . Essential hypertension, benign 03/03/2013  . Fibroma of heart   . Obesity 03/03/2013    Patient Active Problem List   Diagnosis Date Noted  . LLQ pain 05/12/2019  . Amenorrhea, secondary 05/12/2019  . Dizziness 11/24/2018  . Atypical chest pain 05/21/2018  . History of iron deficiency anemia  05/21/2018  . Mixed hyperlipidemia 05/21/2018  . Low back pain radiating to right leg 02/17/2018  . Pedal edema 02/06/2018  . History of DVT (deep vein thrombosis) 02/06/2018  . Right wrist injury, subsequent encounter 01/24/2018  . Right knee injury, subsequent encounter 01/24/2018  . Sprain of MCL (medial collateral ligament) of knee 12/12/2016  . Sprain of right ankle 12/12/2016  . Menorrhagia 05/20/2013  . Essential hypertension, benign 03/03/2013  . Obesity 03/03/2013    Past Surgical History:  Procedure Laterality Date  . BACK SURGERY    . bladder tack    . DILATION AND CURETTAGE OF UTERUS     after SAB  . HEART TUMOR EXCISION       OB History    Gravida  4   Para  3   Term  3   Preterm      AB  1   Living  3     SAB  1   TAB      Ectopic      Multiple      Live Births               Home Medications    Prior to Admission medications   Medication Sig Start Date End Date Taking? Authorizing Provider  acetaminophen (TYLENOL) 500 MG tablet Take 1 tablet (500  mg total) by mouth every 6 (six) hours as needed. 01/21/18   Law, Bea Graff, PA-C  aspirin EC 81 MG tablet Take 1 tablet (81 mg total) by mouth 2 (two) times daily. 04/29/19   Leandrew Koyanagi, MD  b complex vitamins capsule Take 1 capsule by mouth daily.    [provider]  Coenzyme Q10 (CO Q 10 PO) Take by mouth.    [provider]  diltiazem (DILACOR XR) 240 MG 24 hr capsule Take 1 capsule (240 mg total) by mouth daily. Patient not taking: Reported on 05/12/2019 01/15/19   Park Liter, MD  ECHINACEA PO Take by mouth.    [provider]  fish oil-omega-3 fatty acids 1000 MG capsule Take 2 g by mouth daily.    [provider]  Flaxseed, Linseed, 1000 MG CAPS Take by mouth.    [provider]  FLAXSEED, LINSEED, PO Take by mouth.    [provider]  furosemide (LASIX) 20 MG tablet Take 1 tablet (20 mg total) by mouth daily. 01/16/19 04/16/19   Park Liter, MD  HYDROcodone-acetaminophen (NORCO/VICODIN) 5-325 MG tablet Take 1 tablet by mouth every 6 (six) hours as needed for moderate pain. Patient not taking: Reported on 05/12/2019 05/09/19   Meredith Pel, MD  hydrOXYzine (ATARAX/VISTARIL) 25 MG tablet Take 1 tablet (25 mg total) by mouth every 6 (six) hours as needed for anxiety. Patient not taking: Reported on 05/12/2019 12/25/18   Maudie Flakes, MD  ibuprofen (ADVIL) 800 MG tablet Take 1 tablet (800 mg total) by mouth 3 (three) times daily. Patient not taking: Reported on 05/12/2019 04/19/19   Carlisle Cater, PA-C  ibuprofen (ADVIL) 800 MG tablet Take 1 tablet (800 mg total) by mouth every 8 (eight) hours as needed. 04/29/19   Leandrew Koyanagi, MD  Melatonin 5 MG CAPS Take by mouth as needed.     [provider]  methocarbamol (ROBAXIN) 500 MG tablet Take 1 tablet (500 mg total) by mouth 2 (two) times daily as needed (body aches). 05/21/19   Shahid Flori, Ozella Almond, PA-C  metroNIDAZOLE (FLAGYL) 500 MG tablet Take 1 tablet (500 mg total) by mouth 2 (two) times daily. 05/18/19   Lavonia Drafts, MD  Multiple Vitamins-Minerals (MEGA MULTIVITAMIN FOR WOMEN) TABS Take by mouth.    [provider]  ondansetron (ZOFRAN ODT) 4 MG disintegrating tablet Take 1 tablet (4 mg total) by mouth every 8 (eight) hours as needed for nausea or vomiting. 05/21/19   Shone Leventhal, Ozella Almond, PA-C  oxyCODONE-acetaminophen (PERCOCET/ROXICET) 5-325 MG tablet Take 1-2 tablets by mouth every 6 (six) hours as needed for severe pain. 05/16/19   Hayden Rasmussen, MD  promethazine (PHENERGAN) 25 MG tablet Take 1 tablet (25 mg total) by mouth every 6 (six) hours as needed for nausea or vomiting. Patient not taking: Reported on 05/12/2019 06/12/18   Margarita Mail, PA-C  Pyridoxine HCl (VITAMIN B-6) 500 MG tablet Take 500 mg by mouth daily.    [provider]  telmisartan-hydrochlorothiazide (MICARDIS HCT) 80-25 MG tablet Take 1 tablet by mouth  daily. 01/15/19   Park Liter, MD  VITAMIN A OP Apply to eye.    [provider]  vitamin B-12 (CYANOCOBALAMIN) 1000 MCG tablet Take 1,000 mcg by mouth daily.    [provider]  vitamin C (ASCORBIC ACID) 500 MG tablet Take 500 mg by mouth daily.    [provider]  VITAMIN D, CHOLECALCIFEROL, PO Take by mouth.  [provider]    Family History Family History  Problem Relation Age of Onset  . Breast cancer Mother   . Hypertension Mother   . Hyperlipidemia Mother   . Colon cancer Father   . Cancer Father   . Thyroid disease Sister   . Cancer Other   . Thyroid disease Sister   . Hypertension Maternal Aunt   . Heart failure Maternal Aunt   . Cancer Maternal Aunt   . Hypertension Maternal Aunt   . Hypertension Maternal Uncle   . Diabetes Maternal Uncle   . Heart failure Maternal Grandmother   . Cancer Maternal Grandfather        prostate    Social History Social History   Tobacco Use  . Smoking status: Never Smoker  . Smokeless tobacco: Never Used  Substance Use Topics  . Alcohol use: No  . Drug use: No     Allergies   Latex, Lisinopril, Lasix [furosemide], Prednisone, and Sulfur   Review of Systems Review of Systems  Constitutional: Positive for chills, fatigue and fever (Low grade).  HENT: Positive for sore throat.   Respiratory: Positive for cough, chest tightness and shortness of breath. Negative for wheezing.   Cardiovascular: Positive for chest pain. Negative for palpitations and leg swelling.  Gastrointestinal: Positive for nausea. Negative for abdominal pain and vomiting.  Musculoskeletal: Positive for myalgias.  Neurological: Positive for headaches. Negative for dizziness and syncope.  All other systems reviewed and are negative.    Physical Exam Updated Vital Signs BP (!) 174/89 (BP Location: Left Arm)   Pulse 71   Temp 98.2 F (36.8 C) (Oral)   Resp 17   Ht 5\' 4"  (1.626 m)   Wt 104.3 kg   SpO2 98%    BMI 39.48 kg/m   Physical Exam Vitals signs and nursing note reviewed.  Constitutional:      General: She is not in acute distress.    Appearance: She is well-developed.  HENT:     Head: Normocephalic and atraumatic.  Neck:     Musculoskeletal: Neck supple.  Cardiovascular:     Rate and Rhythm: Normal rate and regular rhythm.     Heart sounds: Normal heart sounds. No murmur.  Pulmonary:     Effort: Pulmonary effort is normal. No respiratory distress.     Breath sounds: Normal breath sounds. No wheezing, rhonchi or rales.  Abdominal:     General: There is no distension.     Palpations: Abdomen is soft.     Tenderness: There is no abdominal tenderness.  Musculoskeletal:     Right lower leg: No edema.     Left lower leg: No edema.  Skin:    General: Skin is warm and dry.  Neurological:     Mental Status: She is alert and oriented to person, place, and time.      ED Treatments / Results  Labs (all labs ordered are listed, but only abnormal results are displayed) Labs Reviewed  SARS CORONAVIRUS 2  CBC WITH DIFFERENTIAL/PLATELET  COMPREHENSIVE METABOLIC PANEL  TROPONIN I (HIGH SENSITIVITY)  TROPONIN I (HIGH SENSITIVITY)    EKG EKG Interpretation  Date/Time:  Thursday May 21 2019 12:45:03 EDT Ventricular Rate:  78 PR Interval:    QRS Duration: 92 QT Interval:  383 QTC Calculation: 437 R Axis:   -40 Text Interpretation:  Sinus rhythm Abnormal R-wave progression, late transition Left ventricular hypertrophy Nonspecific T abnormalities, diffuse leads No significant change since last tracing Confirmed by Maryan Rued,  [place] [phone]) on 05/21/2019 2:43:03 PM    ED ECG REPORT   Date: 05/21/2019  Rate: 78  Rhythm: normal sinus rhythm  QRS Axis: normal  Intervals: normal  ST/T Wave abnormalities: nonspecific T wave changes  Conduction Disutrbances:none  Narrative Interpretation:   Old EKG Reviewed: unchanged  I have personally reviewed the EKG tracing with  attending, Dr. Maryan Rued and we agree with the computerized printout as noted.   Radiology Dg Chest Port 1 View  Result Date: 05/21/2019 CLINICAL DATA:  Cough sore throat short of breath EXAM: PORTABLE CHEST 1 VIEW COMPARISON:  11/25/2018 FINDINGS: Post sternotomy changes. No focal airspace disease or effusion. Normal cardiomediastinal silhouette. No pneumothorax. IMPRESSION: No active disease. Electronically Signed   By: Donavan Foil M.D.   On: 05/21/2019 13:40    Procedures Procedures (including critical care time)  Medications Ordered in ED Medications  acetaminophen (TYLENOL) tablet 1,000 mg (1,000 mg Oral Given 05/21/19 1331)  sodium chloride 0.9 % bolus 1,000 mL (0 mLs Intravenous Stopped 05/21/19 1524)  ondansetron (ZOFRAN) injection 4 mg (4 mg Intravenous Given 05/21/19 1524)     Initial Impression / Assessment and Plan / ED Course  I have reviewed the triage vital signs and the nursing notes.  Pertinent labs & imaging results that were available during my care of the patient were reviewed by me and considered in my medical decision making (see chart for details).       Destiny Henderson is a 58 y.o. female who presents to ED for generalized body aches, nausea, cough, congestion, shortness of breath and headache for the last 2 days.  Did have a known COVID exposure about 1 week ago.  On examination, patient is afebrile, hemodynamically stable and oxygenating well on room air without hypoxia.  Lungs are clear to auscultation bilaterally.  Chest x-ray reviewed and without acute findings as well.  Labs reassuring.  Although she feels unwell, there are no indications that she needs admission to the hospital at this time.  Suspect coronavirus, but test still pending.  She does have a primary care doctor.  Encouraged her to call in the morning to arrange follow-up.  Symptomatic home care instructions and return precautions were discussed.  She does have risk factors for acute  worsening, therefore discussed importance of returning should her shortness of breath worsen and she understands.  All questions answered.   Final Clinical Impressions(s) / ED Diagnoses   Final diagnoses:  Cough with fever  Suspected Covid-19 Virus Infection    ED Discharge Orders         Ordered    ondansetron (ZOFRAN ODT) 4 MG disintegrating tablet  Every 8 hours PRN     05/21/19 1512    methocarbamol (ROBAXIN) 500 MG tablet  2 times daily PRN     05/21/19 1549           Edyth Glomb, Ozella Almond, PA-C 05/21/19 1553    Blanchie Dessert, MD 05/22/19 [phone]

## 2019-05-22 ENCOUNTER — Telehealth: Payer: Self-pay

## 2019-05-22 ENCOUNTER — Ambulatory Visit: Payer: 59 | Admitting: Orthopaedic Surgery

## 2019-05-22 NOTE — Telephone Encounter (Signed)
Returned patient's call who states, "I called to cancel my appointment on Monday and someone was supposed to send in medication for me." Patient was hostile and hung up the phone abruptly before I could ask further questions for clarification. Follow up appointment scheduled for Monday, 05/25/2019, with Dr. Agustin Cree has been cancelled.

## 2019-05-22 NOTE — Telephone Encounter (Signed)
Per Nevin Bloodgood patient had returne d call after speaking with Barnetta Chapel this morning and hanging up on her.  I returned call to the patient and she was very hostile and argumentative.  She states that "your office is giving me the run around.  This is the third call I have got this morning. Why are you calling me now?"  I explained that I was following up to her return call from earlier this am.    Patient states she was at [facility] ED on 05-21-2019 for elevated blood pressure and bilateral edema.  She would like to make Dr Raliegh Ip aware of this.  She was scheduled for an in office appointment on Monday, but cancelled appointment as she has been exposed to COVID-19.  She would like a virtual appointment with Dr Agustin Cree.   Patient ended the call in tears telling me, "do not call me back as you are making my blood pressure high." and hung up the phone.   Please contact patient about appointment on Monday.

## 2019-05-22 NOTE — Telephone Encounter (Signed)
Called pt with results. Pt made aware that she is in menopausal stage. Pt  Is scheduled for f/u visit after [name] on 06/08/19. Understanding was voiced.  Yanet Balliet l Abdullahi Vallone, CMA

## 2019-05-22 NOTE — Telephone Encounter (Signed)
-----   Message from Lavonia Drafts, MD sent at 05/22/2019  8:56 AM EDT ----- Yes she is.  Please let her know that I reviewed her chart as well. She needs a pelvic US (TV). Please schedule her a f/u appt with me FOLLOWING the [name].   Thx,  Clh-S    ----- Message ----- From: Valentina Lucks, CMA Sent: 05/18/2019   3:31 PM EDT To: Lavonia Drafts, MD  Pt has a question about her [facility]. She wants to know if she is in the menopausal stage?

## 2019-05-25 ENCOUNTER — Ambulatory Visit: Payer: 59 | Admitting: Cardiology

## 2019-05-25 ENCOUNTER — Telehealth: Payer: Self-pay | Admitting: Cardiology

## 2019-05-25 ENCOUNTER — Ambulatory Visit (HOSPITAL_BASED_OUTPATIENT_CLINIC_OR_DEPARTMENT_OTHER): Payer: 59

## 2019-05-25 NOTE — Telephone Encounter (Signed)
Left message for patient to return call.

## 2019-05-25 NOTE — Telephone Encounter (Signed)
Patient called and made appt this week but reports blood pressure is still up, today was 190/105 while taking medication. She is also dizzy and off balance. Please advise.

## 2019-05-27 ENCOUNTER — Other Ambulatory Visit: Payer: Self-pay

## 2019-05-27 ENCOUNTER — Telehealth (INDEPENDENT_AMBULATORY_CARE_PROVIDER_SITE_OTHER): Payer: 59 | Admitting: Cardiology

## 2019-05-27 ENCOUNTER — Encounter: Payer: Self-pay | Admitting: Cardiology

## 2019-05-27 VITALS — BP 189/101

## 2019-05-27 DIAGNOSIS — R600 Localized edema: Secondary | ICD-10-CM

## 2019-05-27 DIAGNOSIS — I10 Essential (primary) hypertension: Secondary | ICD-10-CM

## 2019-05-27 DIAGNOSIS — E782 Mixed hyperlipidemia: Secondary | ICD-10-CM

## 2019-05-27 MED ORDER — FUROSEMIDE 40 MG PO TABS: 40.0000 mg | ORAL_TABLET | Freq: Every day | ORAL | 1 refills | Status: DC

## 2019-05-27 MED ORDER — POTASSIUM CHLORIDE ER 10 MEQ PO TBCR: 10.0000 meq | EXTENDED_RELEASE_TABLET | Freq: Every day | ORAL | 1 refills | Status: DC

## 2019-05-27 MED FILL — POTASSIUM CL ER 10 MEQ TAB: 10 | 30 days supply | Qty: 30 | Fill #0

## 2019-05-27 MED FILL — FUROSEMIDE 40 MG TAB: 40 | 30 days supply | Qty: 30 | Fill #0

## 2019-05-27 NOTE — Progress Notes (Signed)
Virtual Visit via Video Note   This visit type was conducted due to national recommendations for restrictions regarding the COVID-19 Pandemic (e.g. social distancing) in an effort to limit this patient's exposure and mitigate transmission in our community.  Due to her co-morbid illnesses, this patient is at least at moderate risk for complications without adequate follow up.  This format is felt to be most appropriate for this patient at this time.  All issues noted in this document were discussed and addressed.  A limited physical exam was performed with this format.  Please refer to the patient's chart for her consent to telehealth for [facility].  Evaluation Performed:  Follow-up visit  This visit type was conducted due to national recommendations for restrictions regarding the COVID-19 Pandemic (e.g. social distancing).  This format is felt to be most appropriate for this patient at this time.  All issues noted in this document were discussed and addressed.  No physical exam was performed (except for noted visual exam findings with Video Visits).  Please refer to the patient's chart (MyChart message for video visits and phone note for telephone visits) for the patient's consent to telehealth for [facility].  Date:  05/27/2019  ID: Destiny Henderson, DOB 30-May-1961, MRN [id number]   Patient Location: [place] [address]   Provider location:   [place] Office  PCP:  Lorenda Hatchet, FNP  Cardiologist:  Jenne Campus, MD     Chief Complaint: My blood pressure still high  History of Present Illness:    Destiny Henderson is a 58 y.o. female  who presents via audio/video conferencing for a telehealth visit today.  With essential hypertension which is difficult to control but because of difficulty tolerating medications.  Since have seen her last time she sustained fracture of her ankle she is wearing boot when she is upset about  it.  She also described to have some swelling of lower extremities which is a chronic problem.  She tried to take some extra furosemide few weeks ago she increased dose of furosemide from 20-40 did not notice any improvement in the swelling therefore she went back to 20 mg daily.  Interestingly she noted her blood pressure being better.  We talked today about options to get her blood pressure better under control I think seeing see already proved to as the higher dose of diuretic seems to be helping I propose to increase dose of furosemide to 40 mg daily with 10 mEq of potassium.  Week B, check Chem-7 to see make sure that potassium is still fine and kidney function is fine.  Otherwise she seems to be sat on the phone today very quiet.  Does not talk much.  Upset about the fact that she cannot walk as much as she was able to do before.   The patient does not have symptoms concerning for COVID-19 infection (fever, chills, cough, or new SHORTNESS OF BREATH).    Prior CV studies:   The following studies were reviewed today:       Past Medical History:  Diagnosis Date  . Anemia   . Benign tumor of bladder   . DVT (deep venous thrombosis) ([place])   . Essential hypertension, benign 03/03/2013  . Fibroma of heart   . Obesity 03/03/2013    Past Surgical History:  Procedure Laterality Date  . BACK SURGERY    . bladder tack    . DILATION AND CURETTAGE  OF UTERUS     after SAB  . HEART TUMOR EXCISION       Current Meds  Medication Sig  . acetaminophen (TYLENOL) 500 MG tablet Take 1 tablet (500 mg total) by mouth every 6 (six) hours as needed.  Marland Kitchen aspirin EC 81 MG tablet Take 1 tablet (81 mg total) by mouth 2 (two) times daily.  Marland Kitchen b complex vitamins capsule Take 1 capsule by mouth daily.  . Coenzyme Q10 (CO Q 10 PO) Take by mouth.  . diltiazem (DILACOR XR) 240 MG 24 hr capsule Take 1 capsule (240 mg total) by mouth daily.  . fish oil-omega-3 fatty acids 1000 MG capsule Take 2 g by mouth daily.   . Flaxseed, Linseed, 1000 MG CAPS Take by mouth.  . furosemide (LASIX) 20 MG tablet Take 1 tablet (20 mg total) by mouth daily.  Marland Kitchen HYDROcodone-acetaminophen (NORCO/VICODIN) 5-325 MG tablet Take 1 tablet by mouth every 6 (six) hours as needed for moderate pain.  . hydrOXYzine (ATARAX/VISTARIL) 25 MG tablet Take 1 tablet (25 mg total) by mouth every 6 (six) hours as needed for anxiety.  Marland Kitchen ibuprofen (ADVIL) 800 MG tablet Take 1 tablet (800 mg total) by mouth 3 (three) times daily.  . Melatonin 5 MG CAPS Take by mouth as needed.   . methocarbamol (ROBAXIN) 500 MG tablet Take 1 tablet (500 mg total) by mouth 2 (two) times daily as needed (body aches).  . Multiple Vitamins-Minerals (MEGA MULTIVITAMIN FOR WOMEN) TABS Take by mouth.  . ondansetron (ZOFRAN ODT) 4 MG disintegrating tablet Take 1 tablet (4 mg total) by mouth every 8 (eight) hours as needed for nausea or vomiting.  . promethazine (PHENERGAN) 25 MG tablet Take 1 tablet (25 mg total) by mouth every 6 (six) hours as needed for nausea or vomiting.  . Pyridoxine HCl (VITAMIN B-6) 500 MG tablet Take 500 mg by mouth daily.  Marland Kitchen telmisartan-hydrochlorothiazide (MICARDIS HCT) 80-25 MG tablet Take 1 tablet by mouth daily.  Marland Kitchen VITAMIN A OP Apply to eye.  . vitamin B-12 (CYANOCOBALAMIN) 1000 MCG tablet Take 1,000 mcg by mouth daily.  . vitamin C (ASCORBIC ACID) 500 MG tablet Take 500 mg by mouth daily.  Marland Kitchen VITAMIN D, CHOLECALCIFEROL, PO Take by mouth.      Family History: The patient's family history includes Breast cancer in her mother; Cancer in her father, maternal aunt, maternal grandfather, and another family member; Colon cancer in her father; Diabetes in her maternal uncle; Heart failure in her maternal aunt and maternal grandmother; Hyperlipidemia in her mother; Hypertension in her maternal aunt, maternal aunt, maternal uncle, and mother; Thyroid disease in her sister and sister.   ROS:   Please see the history of present illness.     All  other systems reviewed and are negative.   Labs/Other Tests and Data Reviewed:     Recent Labs: 05/21/2019: ALT 15; BUN 11; Creatinine, Ser 0.99; Hemoglobin 12.3; Platelets 390; Potassium 4.2; Sodium 136  Recent Lipid Panel No results found for: CHOL, TRIG, HDL, CHOLHDL, VLDL, LDLCALC, LDLDIRECT    Exam:    Vital Signs:  BP (!) 189/101     Wt Readings from Last 3 Encounters:  05/21/19 230 lb (104.3 kg)  05/12/19 235 lb 1.3 oz (106.6 kg)  04/19/19 240 lb (108.9 kg)     Well nourished, well developed in no acute distress. Alert awake and x3 happy to be able to see me on the phone.  She is at home sitting in the  living room I am in the office in [facility].  Diagnosis for this visit:   1. Essential hypertension, benign   2. Pedal edema   3. Mixed hyperlipidemia      ASSESSMENT & PLAN:    1.  Essential hypertension plan as outlined above. 2.  Pitting edema I will try higher dose of diuretics and see if that improve 3.  Dyslipidemia she is reluctant to add any medications to her regimen but she does take fish oil.  For now we will continue  COVID-19 Education: The signs and symptoms of COVID-19 were discussed with the patient and how to seek care for testing (follow up with PCP or arrange E-visit).  The importance of social distancing was discussed today.  Patient Risk:   After full review of this patients clinical status, I feel that they are at least moderate risk at this time.  Time:   Today, I have spent 20 minutes with the patient with telehealth technology discussing pt health issues.  I spent 5 minutes reviewing her chart before the visit.  Visit was finished at 838.    Medication Adjustments/Labs and Tests Ordered: Current medicines are reviewed at length with the patient today.  Concerns regarding medicines are outlined above.  No orders of the defined types were placed in this encounter.  Medication changes: No orders of the defined types were placed in this  encounter.    Disposition: Follow-up in 3 weeks  Signed, Park Liter, MD, [facility] 05/27/2019 8:36 AM    [place]

## 2019-05-27 NOTE — Patient Instructions (Addendum)
Medication Instructions:  INCREASE LASIX TO 40mg  DAILY START POTASSIUM 10 meq DAILY If you need a refill on your cardiac medications before your next appointment, please call your pharmacy.   Lab work: Your physician recommends that you return for lab work IN 1 WEEK:  BMP  If you have labs (blood work) drawn today and your tests are completely normal, you will receive your results only by: Marland Kitchen MyChart Message (if you have MyChart) OR . A paper copy in the mail If you have any lab test that is abnormal or we need to change your treatment, we will call you to review the results.  Testing/Procedures: NONE  Follow-Up: At [facility], you and your health needs are our priority.  As part of our continuing mission to provide you with exceptional heart care, we have created designated Provider Care Teams.  These Care Teams include your primary Cardiologist (physician) and Advanced Practice Providers (APPs -  Physician Assistants and Nurse Practitioners) who all work together to provide you with the care you need, when you need it. You will need a follow up appointment in 3 weeks.  Please call our office 2 months in advance to schedule this appointment.  You may see No primary care provider on file. or another member of our [employer] Provider Team in [place]: Shirlee More, MD . Jyl Heinz, MD  Any Other Special Instructions Will Be Listed Below (If Applicable).   Potassium chloride tablets, extended-release tablets or capsules What is this medicine? POTASSIUM CHLORIDE (poe TASS i um KLOOR ide) is a potassium supplement used to prevent and to treat low potassium. Potassium is important for the heart, muscles, and nerves. Too much or too little potassium in the body can cause serious problems. This medicine may be used for other purposes; ask your health care provider or pharmacist if you have questions. COMMON BRAND NAME(S): ED-K+10, K-10, K-8, K-Dur, K-Tab, Kaon-CL, Klor-Con,  Klor-Con M10, Klor-Con M15, Klor-Con M20, Klotrix, Micro-K, Micro-K Extencaps, Slow-K What should I tell my health care provider before I take this medicine? They need to know if you have any of these conditions:  Addison's disease  dehydration  diabetes  difficulty swallowing  heart disease  high levels of potassium in the blood  irregular heartbeat  kidney disease  recent severe burn  stomach ulcers or other stomach problems  an unusual or allergic reaction to potassium, tartrazine, other medicines, foods, dyes, or preservatives  pregnant or trying to get pregnant  breast-feeding How should I use this medicine? Take this medicine by mouth with a full glass of water. Take with food. Follow the directions on the prescription label. Do not suck on, crush, or chew this medicine. If you have difficulty swallowing, ask the pharmacist how to take. Take your medicine at regular intervals. Do not take it more often than directed. Do not stop taking except on your doctor's advice. Talk to your pediatrician regarding the use of this medicine in children. Special care may be needed. Overdosage: If you think you have taken too much of this medicine contact a poison control center or emergency room at once. NOTE: This medicine is only for you. Do not share this medicine with others. What if I miss a dose? If you miss a dose, take it as soon as you can. If it is almost time for your next dose, take only that dose. Do not take double or extra doses. What may interact with this medicine? Do not take this medicine  with any of the following medications:  certain diuretics such as spironolactone, triamterene  certain medicines for stomach problems like atropine; difenoxin and glycopyrrolate  eplerenone  sodium polystyrene sulfonate This medicine may also interact with the following medications:  certain medicines for blood pressure or heart disease like lisinopril, losartan, quinapril,  valsartan  medicines that lower your chance of fighting infection such as cyclosporine, tacrolimus  NSAIDs, medicines for pain and inflammation, like ibuprofen or naproxen  other potassium supplements  salt substitutes This list may not describe all possible interactions. Give your health care provider a list of all the medicines, herbs, non-prescription drugs, or dietary supplements you use. Also tell them if you smoke, drink alcohol, or use illegal drugs. Some items may interact with your medicine. What should I watch for while using this medicine? Visit your doctor or health care professional for regular check ups. You will need lab work done regularly. You may need to be on a special diet while taking this medicine. Ask your doctor. What side effects may I notice from receiving this medicine? Side effects that you should report to your doctor or health care professional as soon as possible:  allergic reactions like skin rash, itching or hives, swelling of the face, lips, or tongue  black, tarry stools  breathing problems  confusion  heartburn  fast, irregular heartbeat  feeling faint or lightheaded, falls  low blood pressure  numbness or tingling in hands or feet  pain when swallowing  unusually weak or tired  weakness, heaviness of legs Side effects that usually do not require medical attention (report to your doctor or health care professional if they continue or are bothersome):  diarrhea  nausea, vomiting  stomach pain This list may not describe all possible side effects. Call your doctor for medical advice about side effects. You may report side effects to FDA at 1-800-FDA-1088. Where should I keep my medicine? Keep out of the reach of children. Store at room temperature between 15 and 30 degrees C (59 and 86 degrees F ). Keep bottle closed tightly to protect this medicine from light and moisture. Throw away any unused medicine after the expiration  date. NOTE: This sheet is a summary. It may not cover all possible information. If you have questions about this medicine, talk to your doctor, pharmacist, or health care provider.  2020 Elsevier/Gold Standard (2016-06-27 11:43:27)

## 2019-05-29 ENCOUNTER — Ambulatory Visit (HOSPITAL_BASED_OUTPATIENT_CLINIC_OR_DEPARTMENT_OTHER): Admission: RE | Admit: 2019-05-29 | Payer: 59 | Source: Ambulatory Visit

## 2019-05-29 ENCOUNTER — Ambulatory Visit (HOSPITAL_BASED_OUTPATIENT_CLINIC_OR_DEPARTMENT_OTHER): Payer: 59

## 2019-05-30 ENCOUNTER — Ambulatory Visit (HOSPITAL_BASED_OUTPATIENT_CLINIC_OR_DEPARTMENT_OTHER)
Admission: RE | Admit: 2019-05-30 | Discharge: 2019-05-30 | Disposition: A | Discharge status: Home / Self Care | Payer: 59 | Source: Ambulatory Visit | Attending: Advanced Practice Midwife | Admitting: Advanced Practice Midwife

## 2019-05-30 ENCOUNTER — Other Ambulatory Visit: Payer: Self-pay

## 2019-05-30 DIAGNOSIS — R109 Unspecified abdominal pain: Secondary | ICD-10-CM | POA: Insufficient documentation

## 2019-05-30 DIAGNOSIS — R1032 Left lower quadrant pain: Secondary | ICD-10-CM | POA: Insufficient documentation

## 2019-06-01 ENCOUNTER — Ambulatory Visit: Payer: 59 | Admitting: Cardiology

## 2019-06-02 ENCOUNTER — Other Ambulatory Visit: Payer: Self-pay | Admitting: Advanced Practice Midwife

## 2019-06-02 MED ORDER — METRONIDAZOLE 500 MG PO TABS: 500.0000 mg | ORAL_TABLET | Freq: Two times a day (BID) | ORAL | 0 refills | Status: AC

## 2019-06-02 MED FILL — METRONIDAZOLE 500 MG TABS: 500 | 7 days supply | Qty: 14 | Fill #0

## 2019-06-02 NOTE — Progress Notes (Unsigned)
Ancillary test showed BV Rx for Flagyl sent to pharmacy

## 2019-06-03 ENCOUNTER — Encounter: Payer: Self-pay | Admitting: Advanced Practice Midwife

## 2019-06-03 DIAGNOSIS — Z975 Presence of (intrauterine) contraceptive device: Secondary | ICD-10-CM

## 2019-06-03 HISTORY — DX: Presence of (intrauterine) contraceptive device: Z97.5

## 2019-06-08 ENCOUNTER — Ambulatory Visit: Payer: 59 | Admitting: Obstetrics & Gynecology

## 2019-06-08 DIAGNOSIS — Z30432 Encounter for removal of intrauterine contraceptive device: Secondary | ICD-10-CM

## 2019-06-10 ENCOUNTER — Ambulatory Visit: Payer: 59 | Admitting: Orthopaedic Surgery

## 2019-06-10 ENCOUNTER — Telehealth: Payer: Self-pay | Admitting: Orthopaedic Surgery

## 2019-06-10 ENCOUNTER — Ambulatory Visit (INDEPENDENT_AMBULATORY_CARE_PROVIDER_SITE_OTHER): Payer: 59 | Admitting: Orthopaedic Surgery

## 2019-06-10 ENCOUNTER — Encounter: Payer: Self-pay | Admitting: Orthopaedic Surgery

## 2019-06-10 ENCOUNTER — Ambulatory Visit: Payer: Self-pay

## 2019-06-10 DIAGNOSIS — S8261XA Displaced fracture of lateral malleolus of right fibula, initial encounter for closed fracture: Secondary | ICD-10-CM | POA: Diagnosis not present

## 2019-06-10 MED ORDER — ACETAMINOPHEN-CODEINE #3 300-30 MG PO TABS: 1.0000 | ORAL_TABLET | Freq: Two times a day (BID) | ORAL | 0 refills | Status: AC | PRN

## 2019-06-10 NOTE — Progress Notes (Signed)
Office Visit Note   Patient: Destiny Henderson           Date of Birth: March 19, 1961           MRN: [id number] Visit Date: 06/10/2019              Requested by: Lorenda Hatchet, [place],  [place] [id number] PCP: Lorenda Hatchet, FNP   Assessment & Plan: Visit Diagnoses:  1. Displaced fracture of lateral malleolus of right fibula, initial encounter for closed fracture     Plan: Impression is 6-week status post nondisplaced fibula fracture.  Patient is doing well overall.  She will continue to use that the ASO brace.  We will continue to keep her out of work as there is no light duty available at her job.  We will recheck her in 6 weeks with three-view x-rays of the right ankle.  Tylenol 3 was prescribed today.  Follow-Up Instructions: Return in about 6 weeks (around 07/22/2019).   Orders:  Orders Placed This Encounter  Procedures  . XR Ankle Complete Right   Meds ordered this encounter  Medications  . acetaminophen-codeine (TYLENOL #3) 300-30 MG tablet    Sig: Take 1-2 tablets by mouth 2 (two) times daily as needed for up to 7 days for moderate pain.    Dispense:  30 tablet    Refill:  0      Procedures: No procedures performed   Clinical Data: No additional findings.   Subjective: Chief Complaint  Patient presents with  . Right Ankle - Pain, Follow-up    Destiny Henderson is a 58 year old female who is following up today for her ankle fracture.  She states that overall she is feeling much better.  She still has some swelling and achiness.  She has been wearing the ASO brace for support.   Review of Systems   Objective: Vital Signs: There were no vitals taken for this visit.  Physical Exam  Ortho Exam Right ankle exam shows mild to moderate swelling of the lateral ankle.  No significant tenderness of the fibula. Specialty Comments:  No specialty comments available.  Imaging: Xr Ankle Complete Right  Result Date: 06/10/2019  Continued healing of the fibula fracture.  There is bridging bony callus formation    PMFS History: Patient Active Problem List   Diagnosis Date Noted  . IUD (intrauterine device) in place 06/03/2019  . LLQ pain 05/12/2019  . Amenorrhea, secondary 05/12/2019  . Dizziness 11/24/2018  . Atypical chest pain 05/21/2018  . History of iron deficiency anemia 05/21/2018  . Mixed hyperlipidemia 05/21/2018  . Low back pain radiating to right leg 02/17/2018  . Pedal edema 02/06/2018  . History of DVT (deep vein thrombosis) 02/06/2018  . Right wrist injury, subsequent encounter 01/24/2018  . Right knee injury, subsequent encounter 01/24/2018  . Sprain of MCL (medial collateral ligament) of knee 12/12/2016  . Sprain of right ankle 12/12/2016  . Menorrhagia 05/20/2013  . Essential hypertension, benign 03/03/2013  . Obesity 03/03/2013   Past Medical History:  Diagnosis Date  . Anemia   . Benign tumor of bladder   . DVT (deep venous thrombosis) ([place])   . Essential hypertension, benign 03/03/2013  . Fibroma of heart   . Obesity 03/03/2013    Family History  Problem Relation Age of Onset  . Breast cancer Mother   . Hypertension Mother   . Hyperlipidemia Mother   . Colon cancer Father   . Cancer Father   .  Thyroid disease Sister   . Cancer Other   . Thyroid disease Sister   . Hypertension Maternal Aunt   . Heart failure Maternal Aunt   . Cancer Maternal Aunt   . Hypertension Maternal Aunt   . Hypertension Maternal Uncle   . Diabetes Maternal Uncle   . Heart failure Maternal Grandmother   . Cancer Maternal Grandfather        prostate    Past Surgical History:  Procedure Laterality Date  . BACK SURGERY    . bladder tack    . DILATION AND CURETTAGE OF UTERUS     after SAB  . HEART TUMOR EXCISION     Social History   Occupational History  . Occupation: Network engineer  Tobacco Use  . Smoking status: Never Smoker  . Smokeless tobacco: Never Used  Substance and Sexual Activity   . Alcohol use: No  . Drug use: No  . Sexual activity: Not on file

## 2019-06-10 NOTE — Telephone Encounter (Signed)
Patient would like an itemized bill of her visits to our office.  She would like to be called when they are ready.  CB#[phone].  Thank you.

## 2019-06-25 ENCOUNTER — Telehealth (INDEPENDENT_AMBULATORY_CARE_PROVIDER_SITE_OTHER): Payer: 59 | Admitting: Cardiology

## 2019-06-25 ENCOUNTER — Encounter: Payer: Self-pay | Admitting: Cardiology

## 2019-06-25 ENCOUNTER — Other Ambulatory Visit: Payer: Self-pay

## 2019-06-25 VITALS — BP 156/91

## 2019-06-25 DIAGNOSIS — I10 Essential (primary) hypertension: Secondary | ICD-10-CM | POA: Diagnosis not present

## 2019-06-25 DIAGNOSIS — R600 Localized edema: Secondary | ICD-10-CM

## 2019-06-25 DIAGNOSIS — S93401S Sprain of unspecified ligament of right ankle, sequela: Secondary | ICD-10-CM

## 2019-06-25 MED ORDER — SPIRONOLACTONE 25 MG PO TABS: 12.5000 mg | ORAL_TABLET | Freq: Every day | ORAL | 1 refills | Status: DC

## 2019-06-25 MED FILL — SPIRONOLACTONE 25 MG TABS: 25 | 30 days supply | Qty: 15 | Fill #0

## 2019-06-25 NOTE — Progress Notes (Signed)
Virtual Visit via Telephone Note   This visit type was conducted due to national recommendations for restrictions regarding the COVID-19 Pandemic (e.g. social distancing) in an effort to limit this patient's exposure and mitigate transmission in our community.  Due to her co-morbid illnesses, this patient is at least at moderate risk for complications without adequate follow up.  This format is felt to be most appropriate for this patient at this time.  The patient did not have access to video technology/had technical difficulties with video requiring transitioning to audio format only (telephone).  All issues noted in this document were discussed and addressed.  No physical exam could be performed with this format.  Please refer to the patient's chart for her  consent to telehealth for [facility].  Evaluation Performed:  Follow-up visit  This visit type was conducted due to national recommendations for restrictions regarding the COVID-19 Pandemic (e.g. social distancing).  This format is felt to be most appropriate for this patient at this time.  All issues noted in this document were discussed and addressed.  No physical exam was performed (except for noted visual exam findings with Video Visits).  Please refer to the patient's chart (MyChart message for video visits and phone note for telephone visits) for the patient's consent to telehealth for [facility].  Date:  06/25/2019  ID: Destiny Henderson, DOB 07-Mar-1961, MRN [id number]   Patient Location: [place] [address]   Provider location:   [place] Office  PCP:  Lorenda Hatchet, FNP  Cardiologist:  Jenne Campus, MD     Chief Complaint: Doing better but complain of a lot of pain in her leg  History of Present Illness:    Destiny Henderson is a 58 y.o. female  who presents via audio/video conferencing for a telehealth visit today.  With essential hypertension which is  difficult to control.  Also recently she sustained some injury to her leg she is been getting physical therapy.  Blood pressure seems to be better in the neighborhood of 1 12-8 50 systolic.  She overall denies having any shortness of breath chest pain tightness squeezing pressure burning chest   The patient does not have symptoms concerning for COVID-19 infection (fever, chills, cough, or new SHORTNESS OF BREATH).    Prior CV studies:   The following studies were reviewed today:       Past Medical History:  Diagnosis Date  . Anemia   . Benign tumor of bladder   . DVT (deep venous thrombosis) ([place])   . Essential hypertension, benign 03/03/2013  . Fibroma of heart   . Obesity 03/03/2013    Past Surgical History:  Procedure Laterality Date  . BACK SURGERY    . bladder tack    . DILATION AND CURETTAGE OF UTERUS     after SAB  . HEART TUMOR EXCISION       Current Meds  Medication Sig  . aspirin EC 81 MG tablet Take 1 tablet (81 mg total) by mouth 2 (two) times daily.  Marland Kitchen b complex vitamins capsule Take 1 capsule by mouth daily.  . Coenzyme Q10 (CO Q 10 PO) Take by mouth.  . diltiazem (DILACOR XR) 240 MG 24 hr capsule Take 1 capsule (240 mg total) by mouth daily.  . fish oil-omega-3 fatty acids 1000 MG capsule Take 2 g by mouth daily.  . Flaxseed, Linseed, 1000 MG CAPS Take by mouth.  . furosemide (LASIX)  40 MG tablet Take 1 tablet (40 mg total) by mouth daily.  . hydrOXYzine (ATARAX/VISTARIL) 25 MG tablet Take 1 tablet (25 mg total) by mouth every 6 (six) hours as needed for anxiety.  Marland Kitchen ibuprofen (ADVIL) 800 MG tablet Take 1 tablet (800 mg total) by mouth 3 (three) times daily.  . Melatonin 5 MG CAPS Take by mouth as needed.   . methocarbamol (ROBAXIN) 500 MG tablet Take 1 tablet (500 mg total) by mouth 2 (two) times daily as needed (body aches).  . Multiple Vitamins-Minerals (MEGA MULTIVITAMIN FOR WOMEN) TABS Take by mouth.  . ondansetron (ZOFRAN ODT) 4 MG disintegrating  tablet Take 1 tablet (4 mg total) by mouth every 8 (eight) hours as needed for nausea or vomiting.  . potassium chloride (K-DUR) 10 MEQ tablet Take 1 tablet (10 mEq total) by mouth daily.  . Pyridoxine HCl (VITAMIN B-6) 500 MG tablet Take 500 mg by mouth daily.  Marland Kitchen telmisartan-hydrochlorothiazide (MICARDIS HCT) 80-25 MG tablet Take 1 tablet by mouth daily.  Marland Kitchen VITAMIN A OP Apply to eye.  . vitamin B-12 (CYANOCOBALAMIN) 1000 MCG tablet Take 1,000 mcg by mouth daily.  . vitamin C (ASCORBIC ACID) 500 MG tablet Take 500 mg by mouth daily.  Marland Kitchen VITAMIN D, CHOLECALCIFEROL, PO Take by mouth.      Family History: The patient's family history includes Breast cancer in her mother; Cancer in her father, maternal aunt, maternal grandfather, and another family member; Colon cancer in her father; Diabetes in her maternal uncle; Heart failure in her maternal aunt and maternal grandmother; Hyperlipidemia in her mother; Hypertension in her maternal aunt, maternal aunt, maternal uncle, and mother; Thyroid disease in her sister and sister.   ROS:   Please see the history of present illness.     All other systems reviewed and are negative.   Labs/Other Tests and Data Reviewed:     Recent Labs: 05/21/2019: ALT 15; BUN 11; Creatinine, Ser 0.99; Hemoglobin 12.3; Platelets 390; Potassium 4.2; Sodium 136  Recent Lipid Panel No results found for: CHOL, TRIG, HDL, CHOLHDL, VLDL, LDLCALC, LDLDIRECT    Exam:    Vital Signs:  BP (!) 156/91     Wt Readings from Last 3 Encounters:  05/21/19 230 lb (104.3 kg)  05/12/19 235 lb 1.3 oz (106.6 kg)  04/19/19 240 lb (108.9 kg)     Well nourished, well developed in no acute distress. Alert awake and attentive 3 talking to me from her home she is unable to establish video link therefore we talk only over the phone not in any distress  Diagnosis for this visit:   1. Essential hypertension, benign   2. Pedal edema   3. Sprain of right ankle, unspecified ligament,  sequela      ASSESSMENT & PLAN:    1.  Essential hypertension which is difficult to control we will add Aldactone 12.5 mg daily to her medical regimen.  Week later we will do her Chem-7. 2.  Pedal edema gone.  Continue present management. 3.  Ankles sprain.  That being follow-up by internal medicine team.  COVID-19 Education: The signs and symptoms of COVID-19 were discussed with the patient and how to seek care for testing (follow up with PCP or arrange E-visit).  The importance of social distancing was discussed today.  Patient Risk:   After full review of this patients clinical status, I feel that they are at least moderate risk at this time.  Time:   Today, I have spent 15  minutes with the patient with telehealth technology discussing pt health issues.  I spent 5 minutes reviewing her chart before the visit.  Visit was finished at 4:00 PM.    Medication Adjustments/Labs and Tests Ordered: Current medicines are reviewed at length with the patient today.  Concerns regarding medicines are outlined above.  No orders of the defined types were placed in this encounter.  Medication changes: No orders of the defined types were placed in this encounter.    Disposition: Follow-up 1 month  Signed, Park Liter, MD, [facility] 06/25/2019 3:59 PM    [place]

## 2019-06-25 NOTE — Patient Instructions (Signed)
Medication Instructions:  Your physician has recommended you make the following change in your medication:  Start: Aldactone 12.5 mg daily  If you need a refill on your cardiac medications before your next appointment, please call your pharmacy.   Lab work: Your physician recommends that you return for lab work today:  If you have labs (blood work) drawn today and your tests are completely normal, you will receive your results only by: Marland Kitchen MyChart Message (if you have MyChart) OR . A paper copy in the mail If you have any lab test that is abnormal or we need to change your treatment, we will call you to review the results.  Testing/Procedures: None.   Follow-Up: At [facility], you and your health needs are our priority.  As part of our continuing mission to provide you with exceptional heart care, we have created designated Provider Care Teams.  These Care Teams include your primary Cardiologist (physician) and Advanced Practice Providers (APPs -  Physician Assistants and Nurse Practitioners) who all work together to provide you with the care you need, when you need it. You will need a follow up appointment in 1 months.  Please call our office 2 months in advance to schedule this appointment.  You may see No primary care provider on file. or another member of our [employer] Provider Team in [place]: Shirlee More, MD . Jyl Heinz, MD  Any Other Special Instructions Will Be Listed Below (If Applicable).  Spironolactone tablets What is this medicine? SPIRONOLACTONE (speer on oh LAK tone) is a diuretic. It helps you make more urine and to lose excess water from your body. This medicine is used to treat high blood pressure, and edema or swelling from heart, kidney, or liver disease. It is also used to treat patients who make too much aldosterone or have low potassium. This medicine may be used for other purposes; ask your health care provider or pharmacist if you have  questions. COMMON BRAND NAME(S): Aldactone What should I tell my health care provider before I take this medicine? They need to know if you have any of these conditions:  high blood level of potassium  kidney disease or trouble making urine  liver disease  an unusual or allergic reaction to spironolactone, other medicines, foods, dyes, or preservatives  pregnant or trying to get pregnant  breast-feeding How should I use this medicine? Take this medicine by mouth with a drink of water. Follow the directions on your prescription label. You can take it with or without food. If it upsets your stomach, take it with food. Do not take your medicine more often than directed. Remember that you will need to pass more urine after taking this medicine. Do not take your doses at a time of day that will cause you problems. Do not take at bedtime. Talk to your pediatrician regarding the use of this medicine in children. While this drug may be prescribed for selected conditions, precautions do apply. Overdosage: If you think you have taken too much of this medicine contact a poison control center or emergency room at once. NOTE: This medicine is only for you. Do not share this medicine with others. What if I miss a dose? If you miss a dose, take it as soon as you can. If it is almost time for your next dose, take only that dose. Do not take double or extra doses. What may interact with this medicine? Do not take this medicine with any of the following  medications:  cidofovir  eplerenone  tranylcypromine This medicine may also interact with the following medications:  aspirin  certain medicines for blood pressure or heart disease like benazepril, lisinopril, losartan, valsartan  certain medicines that treat or prevent blood clots like heparin and enoxaparin  cholestyramine  cyclosporine  digoxin  lithium  medicines that relax muscles for surgery  NSAIDs, medicines for pain and  inflammation, like ibuprofen or naproxen  other diuretics  potassium supplements  steroid medicines like prednisone or cortisone  trimethoprim This list may not describe all possible interactions. Give your health care provider a list of all the medicines, herbs, non-prescription drugs, or dietary supplements you use. Also tell them if you smoke, drink alcohol, or use illegal drugs. Some items may interact with your medicine. What should I watch for while using this medicine? Visit your doctor or health care professional for regular checks on your progress. Check your blood pressure as directed. Ask your doctor what your blood pressure should be, and when you should contact them. You may need to be on a special diet while taking this medicine. Ask your doctor. Also, ask how many glasses of fluid you need to drink a day. You must not get dehydrated. This medicine may make you feel confused, dizzy or lightheaded. Drinking alcohol and taking some medicines can make this worse. Do not drive, use machinery, or do anything that needs mental alertness until you know how this medicine affects you. Do not sit or stand up quickly. What side effects may I notice from receiving this medicine? Side effects that you should report to your doctor or health care professional as soon as possible:  allergic reactions such as skin rash or itching, hives, swelling of the lips, mouth, tongue, or throat  black or tarry stools  fast, irregular heartbeat  fever  muscle pain, cramps  numbness, tingling in hands or feet  trouble breathing  trouble passing urine  unusual bleeding  unusually weak or tired Side effects that usually do not require medical attention (report to your doctor or health care professional if they continue or are bothersome):  change in voice or hair growth  confusion  dizzy, drowsy  dry mouth, increased thirst  enlarged or tender breasts  headache  irregular menstrual  periods  sexual difficulty, unable to have an erection  stomach upset This list may not describe all possible side effects. Call your doctor for medical advice about side effects. You may report side effects to FDA at 1-800-FDA-1088. Where should I keep my medicine? Keep out of the reach of children. Store below 25 degrees C (77 degrees F). Throw away any unused medicine after the expiration date. NOTE: This sheet is a summary. It may not cover all possible information. If you have questions about this medicine, talk to your doctor, pharmacist, or health care provider.  2020 Elsevier/Gold Standard (2017-02-08 09:42:28)

## 2019-06-25 NOTE — Addendum Note (Signed)
Addended by: Ashok Norris on: 06/25/2019 04:14 PM   Modules accepted: Orders

## 2019-06-26 ENCOUNTER — Telehealth: Payer: Self-pay | Admitting: Emergency Medicine

## 2019-06-26 ENCOUNTER — Ambulatory Visit (INDEPENDENT_AMBULATORY_CARE_PROVIDER_SITE_OTHER): Payer: 59 | Admitting: Family Medicine

## 2019-06-26 ENCOUNTER — Other Ambulatory Visit: Payer: Self-pay

## 2019-06-26 ENCOUNTER — Encounter: Payer: Self-pay | Admitting: Family Medicine

## 2019-06-26 VITALS — BP 148/98 | HR 85 | Temp 97.0°F | Ht 64.0 in | Wt 238.5 lb

## 2019-06-26 DIAGNOSIS — N3000 Acute cystitis without hematuria: Secondary | ICD-10-CM | POA: Diagnosis not present

## 2019-06-26 DIAGNOSIS — R319 Hematuria, unspecified: Secondary | ICD-10-CM | POA: Diagnosis not present

## 2019-06-26 DIAGNOSIS — R030 Elevated blood-pressure reading, without diagnosis of hypertension: Secondary | ICD-10-CM

## 2019-06-26 DIAGNOSIS — Z1239 Encounter for other screening for malignant neoplasm of breast: Secondary | ICD-10-CM

## 2019-06-26 DIAGNOSIS — Z87898 Personal history of other specified conditions: Secondary | ICD-10-CM

## 2019-06-26 DIAGNOSIS — G629 Polyneuropathy, unspecified: Secondary | ICD-10-CM

## 2019-06-26 DIAGNOSIS — G43809 Other migraine, not intractable, without status migrainosus: Secondary | ICD-10-CM

## 2019-06-26 DIAGNOSIS — M2550 Pain in unspecified joint: Secondary | ICD-10-CM

## 2019-06-26 MED ORDER — FLUCONAZOLE 150 MG PO TABS: 150.0000 mg | ORAL_TABLET | Freq: Once | ORAL | 0 refills | Status: AC

## 2019-06-26 MED ORDER — NITROFURANTOIN MACROCRYSTAL 100 MG PO CAPS: 100.0000 mg | ORAL_CAPSULE | Freq: Two times a day (BID) | ORAL | 0 refills | Status: DC

## 2019-06-26 MED FILL — POTASSIUM CHL ER M10 TABLET: 10 | 30 days supply | Qty: 30 | Fill #0

## 2019-06-26 MED FILL — NITROFURANTOIN MONO-MCR 100: 100 | 5 days supply | Qty: 10 | Fill #0

## 2019-06-26 MED FILL — FLUCONAZOLE 150 MG TABS: 150 | 1 days supply | Qty: 1 | Fill #0

## 2019-06-26 NOTE — Telephone Encounter (Signed)
Yesterday while calling patient with discharge instructions she asked about Dr. Agustin Cree filling out disability paperwork. I informed her Dr. Agustin Cree advised we had this sent to to PCP because there was not a significant reason cardiac related to justify disability. Called patient back informed her of this, she reports she no longer has a pcp and still wants Dr. Agustin Cree to fill the paper work out. I informed her he will fill it out. She plans to drop off paperwork..   I also attempted to call her back and left voicemail message asking her to return our phone call so we can inform her of [place] charge for disability paperwork and schedule change on 08/06/2019, this was her follow up appointment however Dr. Agustin Cree will be in [place] she has been switched to [facility], NP if patient approves.Otherwise we can offer her to see Dr. Agustin Cree in [place] on 10/29.

## 2019-06-26 NOTE — Progress Notes (Addendum)
Chief Complaint  Patient presents with  . New Patient (Initial Visit)    referral rheumatology and mammogran       New Patient Visit SUBJECTIVE: HPI: Destiny Henderson is an 58 y.o.female who is being seen for establishing care.  The patient was previously seen in another office.  Patient has a history of hypertension.  She is working the cardiology team to lower at this time.  She is compliant with all of her medications.  Her diet is relatively poor though she has lost 40 pounds.  She does not exercise routinely.  +pressure, freq. Denies bleeding, discharge, abdominal pain, nausea/vomiting, injury, fevers, or flank pain.  She does have a recurrent history of more frequent infections this year.  She is in the perimenopausal state. No new sexual partners or d/c.  Patient has a history of diffuse joint pain.  She did have a positive ANA and was referred to rheumatology.  Follow-up lab testing was normal.  She denies any rashes.  No family history of any autoimmune disease.  She is requesting a referral to a new rheumatologist in the [facility].  Patient is due for mammogram.  She was requesting an order.  She does have numbness and tingling in her lower extremities.  Initial work-up with her rheumatologist was unremarkable but she was in the process of being referred to a neurologist.  She is requesting such a referral today.  She is not having any weakness but does have pins/needles sensation in her lower extremities up to her knees.  Patient also has a history of migraines.  No associated aura.  She does not currently have a headache.  She is not on any prophylaxis.  She will sometimes use OTC medications.  Allergies  Allergen Reactions  . Latex Rash    Other reaction(s): Chest Pain  . Lisinopril Swelling    Angioedema   . Lasix [Furosemide] Rash and Other (See Comments)    Shortness of breath  . Prednisone Rash  . Sulfur Rash   Social History   Socioeconomic  History  . Marital status: Married  Occupational History  . Occupation: Network engineer  Substance and Sexual Activity  . Alcohol use: No  . Drug use: No    Past Medical History:  Diagnosis Date  . Anemia   . Benign tumor of bladder   . DVT (deep venous thrombosis) ([place])   . Essential hypertension, benign 03/03/2013  . Fibroma of heart   . Obesity 03/03/2013   Past Surgical History:  Procedure Laterality Date  . BACK SURGERY    . bladder tack    . DILATION AND CURETTAGE OF UTERUS     after SAB  . HEART TUMOR EXCISION     Family History  Problem Relation Age of Onset  . Breast cancer Mother   . Hypertension Mother   . Hyperlipidemia Mother   . Colon cancer Father   . Cancer Father   . Thyroid disease Sister   . Cancer Other   . Thyroid disease Sister   . Hypertension Maternal Aunt   . Heart failure Maternal Aunt   . Cancer Maternal Aunt   . Hypertension Maternal Aunt   . Hypertension Maternal Uncle   . Diabetes Maternal Uncle   . Heart failure Maternal Grandmother   . Cancer Maternal Grandfather        prostate   Allergies  Allergen Reactions  . Latex Rash    Other reaction(s): Chest Pain  . Lisinopril  Swelling    Angioedema   . Lasix [Furosemide] Rash and Other (See Comments)    Shortness of breath  . Prednisone Rash  . Sulfur Rash    Current Outpatient Medications:  .  aspirin EC 81 MG tablet, Take 1 tablet (81 mg total) by mouth 2 (two) times daily., Disp: 42 tablet, Rfl: 0 .  b complex vitamins capsule, Take 1 capsule by mouth daily., Disp: , Rfl:  .  Coenzyme Q10 (CO Q 10 PO), Take by mouth., Disp: , Rfl:  .  diltiazem (DILACOR XR) 240 MG 24 hr capsule, Take 1 capsule (240 mg total) by mouth daily., Disp: 90 capsule, Rfl: 1 .  fish oil-omega-3 fatty acids 1000 MG capsule, Take 2 g by mouth daily., Disp: , Rfl:  .  Flaxseed, Linseed, 1000 MG CAPS, Take by mouth., Disp: , Rfl:  .  furosemide (LASIX) 40 MG tablet, Take 1 tablet (40 mg total) by mouth daily.,  Disp: 90 tablet, Rfl: 1 .  Melatonin 5 MG CAPS, Take by mouth as needed. , Disp: , Rfl:  .  Multiple Vitamins-Minerals (MEGA MULTIVITAMIN FOR WOMEN) TABS, Take by mouth., Disp: , Rfl:  .  potassium chloride (K-DUR) 10 MEQ tablet, Take 1 tablet (10 mEq total) by mouth daily., Disp: 90 tablet, Rfl: 1 .  Pyridoxine HCl (VITAMIN B-6) 500 MG tablet, Take 500 mg by mouth daily., Disp: , Rfl:  .  spironolactone (ALDACTONE) 25 MG tablet, Take 0.5 tablets (12.5 mg total) by mouth daily., Disp: 30 tablet, Rfl: 1 .  telmisartan-hydrochlorothiazide (MICARDIS HCT) 80-25 MG tablet, Take 1 tablet by mouth daily., Disp: 90 tablet, Rfl: 1 .  VITAMIN A OP, Apply to eye., Disp: , Rfl:  .  vitamin B-12 (CYANOCOBALAMIN) 1000 MCG tablet, Take 1,000 mcg by mouth daily., Disp: , Rfl:  .  vitamin C (ASCORBIC ACID) 500 MG tablet, Take 500 mg by mouth daily., Disp: , Rfl:  .  VITAMIN D, CHOLECALCIFEROL, PO, Take by mouth., Disp: , Rfl:  .  nitrofurantoin (MACRODANTIN) 100 MG capsule, Take 1 capsule (100 mg total) by mouth 2 (two) times daily for 5 days., Disp: 10 capsule, Rfl: 0  No LMP recorded. (Menstrual status: IUD).  ROS Constitutional: No fevers Cardiovascular: No chest pain Neuro:+ Neuropathy Skin: No rash GI: No constipation GU: As noted in HPI MSK: +arthralgias Lungs: No shortness of breath Endo: No weight gain Eyes: No vision changes  OBJECTIVE: BP (!) 148/98 (BP Location: Left Arm, Patient Position: Sitting, Cuff Size: Large)   Pulse 85   Temp (!) 97 F (36.1 C) (Temporal)   Ht 5\' 4"  (1.626 m)   Wt 108.2 kg   SpO2 98%   BMI 40.94 kg/m   Constitutional: -  VS reviewed -  Well developed, well nourished, appears stated age -  No apparent distress  Psychiatric: -  Oriented to person, place, and time -  Memory intact -  Affect and mood normal -  Fluent conversation, good eye contact -  Judgment and insight age appropriate  Eye: -  Conjunctivae clear, no discharge -  Pupils symmetric, round,  reactive to light  ENMT: -  MMM    Pharynx moist, no exudate, no erythema  Neck: -  No gross swelling, no palpable masses -  Thyroid midline, not enlarged, mobile, no palpable masses  Cardiovascular: -  RRR -  No LE edema  Respiratory: -  Normal respiratory effort, no accessory muscle use, no retraction -  Breath sounds equal, no  wheezes, no ronchi, no crackles  Gastrointestinal: -  Bowel sounds normal -  No tenderness, no distention, no guarding, no masses  Neurological:  -  CN II - XII grossly intact -  No cerebellar signs -  DTR's equal and symmetric -  Sensation grossly intact to light touch, equal bilaterally  Musculoskeletal: -  No clubbing, no cyanosis -  Gait normal -  5/5 strength throughout  Skin: -  No significant lesion on inspection -  Warm and dry to palpation   ASSESSMENT/PLAN: Elevated blood pressure reading  Hematuria, unspecified type - Plan: Urinalysis, Routine w reflex microscopic, Urine Culture  Acute cystitis without hematuria - Plan: Urine Culture  History of elevated antinuclear antibody (ANA)  Arthralgia, unspecified joint  Screening for breast cancer - Plan: MM DIGITAL SCREENING BILATERAL  Other migraine without status migrainosus, not intractable - Plan: Ambulatory referral to Neurology  Neuropathy - Plan: Ambulatory referral to Neurology  Patient instructed to sign release of records form from her previous PCP. 1-continue with cardiology team, not controlled today.  Counseled on diet and exercise.  She would like to discuss inability to lose weight.  We will defer this to her next appointment. 2-check microscopy 3-urine studies suggestive of infection, will treat with nitrofurantoin and check culture 4/5-for ANA/arthralgia, will refer to rheumatology through [facility].  If they rejected referral, she may be better off continuing care with her current rheumatologist. 6-order placed 7-declined any medication at this time.  Requested referral to the  neurology team. 8-declined medication for this as well.  We will see what the neurology team is to say about this as well. Patient should return 6 weeks for a physical. The patient voiced understanding and agreement to the plan.   [place], DO 06/27/19  8:05 PM

## 2019-06-26 NOTE — Patient Instructions (Addendum)
We will be in touch regarding your urine results.  If you do not hear anything about your referral in the next 1-2 weeks, call our office and ask for an update.  Keep the diet clean and stay active.  Let us know if you need anything.

## 2019-06-28 LAB — URINALYSIS, ROUTINE W REFLEX MICROSCOPIC
Bilirubin Urine: NEGATIVE
Glucose, UA: NEGATIVE
Ketones, ur: NEGATIVE
Nitrite: POSITIVE — AB
Protein, ur: NEGATIVE
Specific Gravity, Urine: 1.021 (ref 1.001–1.03)
pH: 6.5 (ref 5.0–8.0)

## 2019-06-28 LAB — URINE CULTURE
MICRO NUMBER:: 897950
SPECIMEN QUALITY:: ADEQUATE

## 2019-06-29 ENCOUNTER — Ambulatory Visit: Payer: Self-pay

## 2019-06-29 ENCOUNTER — Other Ambulatory Visit: Payer: Self-pay | Admitting: Family Medicine

## 2019-06-29 MED ORDER — SULFAMETHOXAZOLE-TRIMETHOPRIM 800-160 MG PO TABS: 1.0000 | ORAL_TABLET | Freq: Two times a day (BID) | ORAL | 0 refills | Status: DC

## 2019-06-29 MED ORDER — CEPHALEXIN 500 MG PO CAPS: 500.0000 mg | ORAL_CAPSULE | Freq: Four times a day (QID) | ORAL | 0 refills | Status: AC

## 2019-06-29 NOTE — Telephone Encounter (Signed)
Patient informed of new medication sent.

## 2019-06-29 NOTE — Telephone Encounter (Signed)
Sent!

## 2019-06-29 NOTE — Addendum Note (Signed)
Addended by: Ames Coupe on: 06/29/2019 05:14 PM   Modules accepted: Orders

## 2019-06-29 NOTE — Telephone Encounter (Signed)
PCP canceled Bactrim//sent in place of that Cephalexin. The pharmacy has been notified by phone of all information. Gave a verbal for fluconazole per PCP order. Patient is aware Patient informed to use Benadryl for the itching and that is should clear up since she has stopped medication per PCP

## 2019-06-29 NOTE — Addendum Note (Signed)
Addended by: Sharon Seller B on: 06/29/2019 05:27 PM   Modules accepted: Orders

## 2019-06-29 NOTE — Telephone Encounter (Signed)
New rx sent, let pt know she does have an infection and the new med should help also. Ty.

## 2019-06-29 NOTE — Telephone Encounter (Signed)
FYI. Please advise.

## 2019-06-29 NOTE — Progress Notes (Signed)
Bactrim and Macrobid cancelled 2/2 allergies. Keflex sent instead to [facility].

## 2019-06-29 NOTE — Telephone Encounter (Signed)
Pt called stating that she had started to take macrodantin on Saturday for UTI symptoms.  She states that Sunday she started to have a fine rash over her arms shoulders and some on her abdomin.  She is taking benadryl for itching.  She denies itching of her tongue or mouth. No swelling or difficulty breathing. Call placed to office Office will return call to patient after advised by Dr Nani Ravens. Patient was instructed to stop medication Macrodantin. She verbalized understanding of all instructions and will wait for call from office  Reason for Disposition . Taking new prescription antibiotic (Exception: finished taking new prescription antibiotic)  Answer Assessment - Initial Assessment Questions 1. APPEARANCE of RASH: "Describe the rash." (e.g., spots, blisters, raised areas, skin peeling, scaly)     Fine rash red 2. SIZE: "How big are the spots?" (e.g., tip of pen, eraser, coin; inches, centimeters)     Tip of pen 3. LOCATION: "Where is the rash located?"     Arms and upper shoulder stomach 4. COLOR: "What color is the rash?" (Note: It is difficult to assess rash color in people with darker-colored skin. When this situation occurs, simply ask the caller to describe what they see.)     red 5. ONSET: "When did the rash begin?"     Early Sunday morning 6. FEVER: "Do you have a fever?" If so, ask: "What is your temperature, how was it measured, and when did it start?"     no 7. ITCHING: "Does the rash itch?" If so, ask: "How bad is the itch?" (Scale 1-10; or mild, moderate, severe)     7 8. CAUSE: "What do you think is causing the rash?"    Mactobid 9. NEW MEDICATION: "What new medication are you taking?" (e.g., name of antibiotic) "When did you start taking this medication?".     macrobid 10. OTHER SYMPTOMS: "Do you have any other symptoms?" (e.g., sore throat, fever, joint pain)      no 11. PREGNANCY: "Is there any chance you are pregnant?" "When was your last menstrual period?"       No  marina  Protocols used: [place]

## 2019-07-15 ENCOUNTER — Other Ambulatory Visit: Payer: Self-pay | Admitting: Family Medicine

## 2019-07-15 DIAGNOSIS — Z87898 Personal history of other specified conditions: Secondary | ICD-10-CM

## 2019-07-17 ENCOUNTER — Ambulatory Visit (HOSPITAL_BASED_OUTPATIENT_CLINIC_OR_DEPARTMENT_OTHER): Payer: 59

## 2019-07-20 ENCOUNTER — Telehealth: Payer: Self-pay | Admitting: Orthopaedic Surgery

## 2019-07-20 NOTE — Telephone Encounter (Signed)
Called patient left message to return call to R/S appointment per pt request. Also gave phone number to the billing department to obtain itemize bill and diag code information needed.

## 2019-07-22 ENCOUNTER — Ambulatory Visit: Payer: 59 | Admitting: Orthopaedic Surgery

## 2019-07-24 ENCOUNTER — Other Ambulatory Visit: Payer: Self-pay

## 2019-07-24 ENCOUNTER — Ambulatory Visit (HOSPITAL_BASED_OUTPATIENT_CLINIC_OR_DEPARTMENT_OTHER)
Admission: RE | Admit: 2019-07-24 | Discharge: 2019-07-24 | Disposition: A | Discharge status: Home / Self Care | Payer: 59 | Source: Ambulatory Visit | Attending: Family Medicine | Admitting: Family Medicine

## 2019-07-24 DIAGNOSIS — Z1231 Encounter for screening mammogram for malignant neoplasm of breast: Secondary | ICD-10-CM | POA: Diagnosis present

## 2019-07-24 DIAGNOSIS — Z1239 Encounter for other screening for malignant neoplasm of breast: Secondary | ICD-10-CM

## 2019-07-29 ENCOUNTER — Other Ambulatory Visit: Payer: Self-pay

## 2019-07-29 ENCOUNTER — Telehealth: Payer: Self-pay

## 2019-07-29 NOTE — Telephone Encounter (Signed)
Pt called the office requesting an appointment to have her IUD removed. Pt states her doctor told her she needs to be seen now because she is having abdominal pain and burni, explained to pt that the only day that I can get her in is 08/13/19. Pt states that the date is too far away. Called pt back to work her for a sooner date but got vm.  Eletha Culbertson l Treavor Blomquist, CMA

## 2019-07-30 ENCOUNTER — Ambulatory Visit (INDEPENDENT_AMBULATORY_CARE_PROVIDER_SITE_OTHER): Payer: 59 | Admitting: Family Medicine

## 2019-07-30 ENCOUNTER — Other Ambulatory Visit (HOSPITAL_COMMUNITY)
Admission: RE | Admit: 2019-07-30 | Discharge: 2019-07-30 | Disposition: A | Discharge status: Home / Self Care | Payer: 59 | Source: Ambulatory Visit | Attending: Family Medicine | Admitting: Family Medicine

## 2019-07-30 ENCOUNTER — Ambulatory Visit: Payer: 59 | Admitting: Physician Assistant

## 2019-07-30 ENCOUNTER — Encounter: Payer: Self-pay | Admitting: Family Medicine

## 2019-07-30 VITALS — BP 152/92 | HR 82 | Temp 96.3°F | Resp 17 | Ht 64.0 in | Wt 243.0 lb

## 2019-07-30 DIAGNOSIS — Z789 Other specified health status: Secondary | ICD-10-CM

## 2019-07-30 DIAGNOSIS — Z113 Encounter for screening for infections with a predominantly sexual mode of transmission: Secondary | ICD-10-CM

## 2019-07-30 DIAGNOSIS — R102 Pelvic and perineal pain: Secondary | ICD-10-CM | POA: Insufficient documentation

## 2019-07-30 DIAGNOSIS — R635 Abnormal weight gain: Secondary | ICD-10-CM

## 2019-07-30 DIAGNOSIS — N3000 Acute cystitis without hematuria: Secondary | ICD-10-CM | POA: Diagnosis not present

## 2019-07-30 LAB — POCT URINALYSIS DIP (MANUAL ENTRY)
Bilirubin, UA: NEGATIVE
Blood, UA: NEGATIVE
Glucose, UA: NEGATIVE mg/dL
Ketones, POC UA: NEGATIVE mg/dL
Leukocytes, UA: NEGATIVE
Nitrite, UA: NEGATIVE
Spec Grav, UA: 1.015 (ref 1.010–1.025)
Urobilinogen, UA: 0.2 E.U./dL
pH, UA: 7 (ref 5.0–8.0)

## 2019-07-30 LAB — POCT URINE PREGNANCY: Preg Test, Ur: NEGATIVE

## 2019-07-30 MED ORDER — CEPHALEXIN 500 MG PO CAPS: 500.0000 mg | ORAL_CAPSULE | Freq: Two times a day (BID) | ORAL | 0 refills | Status: DC

## 2019-07-30 MED ORDER — FLUCONAZOLE 150 MG PO TABS: 150.0000 mg | ORAL_TABLET | Freq: Once | ORAL | 0 refills | Status: AC

## 2019-07-30 MED ORDER — CEPHALEXIN 500 MG PO CAPS: 500.0000 mg | ORAL_CAPSULE | Freq: Two times a day (BID) | ORAL | 0 refills | Status: AC

## 2019-07-30 MED ORDER — CEFTRIAXONE SODIUM 1 G IJ SOLR
250.0000 mg | Freq: Once | INTRAMUSCULAR | Status: AC
  Administered 2019-07-30: 250 mg via INTRAMUSCULAR

## 2019-07-30 MED ORDER — DOXYCYCLINE HYCLATE 100 MG PO CAPS: 100.0000 mg | ORAL_CAPSULE | Freq: Two times a day (BID) | ORAL | 0 refills | Status: DC

## 2019-07-30 MED FILL — FLUCONAZOLE 150 MG TABS: 150 | 7 days supply | Qty: 2 | Fill #0

## 2019-07-30 MED FILL — CEPHALEXIN 500 MG CAPSULE: 500 | 7 days supply | Qty: 14 | Fill #0

## 2019-07-30 MED FILL — DOXYCYCLINE HYCLATE 100 MG: 100 | 14 days supply | Qty: 28 | Fill #0

## 2019-07-30 NOTE — Progress Notes (Addendum)
[facility] [address], [place], [place] [address] [phone]  Date:  07/30/2019   Name:  Destiny Henderson   DOB:  08-13-1961   MRN:  [id number]  PCP:  Shelda Pal, DO    Chief Complaint: Urinary Tract Infection (started 4 days ago, burning, odor, cloudy urine, feels "like uterus is about to fall out" , nausea, no fever, but chills)   History of Present Illness:  Destiny Henderson is a 58 y.o. very pleasant female patient who presents with the following:  Patient with history of hyperlipidemia, obesity, menorrhagia, hypertension. 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 Earl Many notes, she is working on weight loss and blood pressure control Here today with concern of possible UTI for another pelvic infection  She has noted symptoms of UTI-pain with urination She has noted some burning with urination She also has a sensation that "my uterus is going to fall out".  She notes she had a bladder tack 20 or 30 years ago She has to urinate frequently No hematuria  She was in just about 1 month ago with a pansensitive E. coli UTI, culture proven-was treated with Macrobid She also had an Enterococcus UTI in February of this year  She reports that her GYN recently checked an [facility] level, and told her she is in menopause.  She has a progesterone IUD in place and would like to have this removed She does not really have vaginal dryness   She was sexually active with her ex recently and the condom came off- this occurred about 8 days ago  He had been unfaithful to her in the past so she is a bit concerned that she could have caught an STI  She has felt nauseated the last 2 days- she used some zofran that she had on hand and has not vomited She also has noted some swelling of her ankles and weight gain -she plans to discuss these issues with Dr.  Nani Ravens at her next routine visit  Wt Readings from Last 3 Encounters:  07/30/19 243 lb (110.2 kg)  06/26/19 238 lb 8 oz (108.2 kg)  05/21/19 230 lb (104.3 kg)  She had an echocardiogram this spring, EF normal  Patient Active Problem List   Diagnosis Date Noted  . Hepatitis B immune 08/01/2019  . IUD (intrauterine device) in place 06/03/2019  . LLQ pain 05/12/2019  . Amenorrhea, secondary 05/12/2019  . Dizziness 11/24/2018  . Atypical chest pain 05/21/2018  . History of iron deficiency anemia 05/21/2018  . Mixed hyperlipidemia 05/21/2018  . Low back pain radiating to right leg 02/17/2018  . Pedal edema 02/06/2018  . History of DVT (deep vein thrombosis) 02/06/2018  . Right wrist injury, subsequent encounter 01/24/2018  . Right knee injury, subsequent encounter 01/24/2018  . Sprain of MCL (medial collateral ligament) of knee 12/12/2016  . Sprain of right ankle 12/12/2016  . Menorrhagia 05/20/2013  . Essential hypertension, benign 03/03/2013  . Obesity 03/03/2013    Past Medical History:  Diagnosis Date  . Anemia   . Benign tumor of bladder   . DVT (deep venous thrombosis) ([place])   . Essential hypertension, benign 03/03/2013  . Fibroma of heart   . Obesity 03/03/2013    Past Surgical History:  Procedure Laterality Date  . BACK SURGERY    . bladder tack    .  DILATION AND CURETTAGE OF UTERUS     after SAB  . HEART TUMOR EXCISION      Social History   Tobacco Use  . Smoking status: Never Smoker  . Smokeless tobacco: Never Used  Substance Use Topics  . Alcohol use: No  . Drug use: No    Family History  Problem Relation Age of Onset  . Breast cancer Mother   . Hypertension Mother   . Hyperlipidemia Mother   . Colon cancer Father   . Cancer Father   . Thyroid disease Sister   . Cancer Other   . Thyroid disease Sister   . Hypertension Maternal Aunt   . Heart failure Maternal Aunt   . Cancer Maternal Aunt   . Hypertension Maternal Aunt   . Hypertension  Maternal Uncle   . Diabetes Maternal Uncle   . Heart failure Maternal Grandmother   . Cancer Maternal Grandfather        prostate    Allergies  Allergen Reactions  . Latex Rash    Other reaction(s): Chest Pain  . Lisinopril Swelling    Angioedema   . Lasix [Furosemide] Rash and Other (See Comments)    Shortness of breath  . Prednisone Rash  . Sulfur Rash    Medication list has been reviewed and updated.  Current Outpatient Medications on File Prior to Visit  Medication Sig Dispense Refill  . aspirin EC 81 MG tablet Take 1 tablet (81 mg total) by mouth 2 (two) times daily. 42 tablet 0  . b complex vitamins capsule Take 1 capsule by mouth daily.    . Coenzyme Q10 (CO Q 10 PO) Take by mouth.    . diltiazem (DILACOR XR) 240 MG 24 hr capsule Take 1 capsule (240 mg total) by mouth daily. 90 capsule 1  . fish oil-omega-3 fatty acids 1000 MG capsule Take 2 g by mouth daily.    . Flaxseed, Linseed, 1000 MG CAPS Take by mouth.    . furosemide (LASIX) 40 MG tablet Take 1 tablet (40 mg total) by mouth daily. 90 tablet 1  . Melatonin 5 MG CAPS Take by mouth as needed.     . Multiple Vitamins-Minerals (MEGA MULTIVITAMIN FOR WOMEN) TABS Take by mouth.    . potassium chloride (K-DUR) 10 MEQ tablet Take 1 tablet (10 mEq total) by mouth daily. 90 tablet 1  . Pyridoxine HCl (VITAMIN B-6) 500 MG tablet Take 500 mg by mouth daily.    Marland Kitchen spironolactone (ALDACTONE) 25 MG tablet Take 0.5 tablets (12.5 mg total) by mouth daily. 30 tablet 1  . telmisartan-hydrochlorothiazide (MICARDIS HCT) 80-25 MG tablet Take 1 tablet by mouth daily. 90 tablet 1  . VITAMIN A OP Apply to eye.    . vitamin B-12 (CYANOCOBALAMIN) 1000 MCG tablet Take 1,000 mcg by mouth daily.    . vitamin C (ASCORBIC ACID) 500 MG tablet Take 500 mg by mouth daily.    Marland Kitchen VITAMIN D, CHOLECALCIFEROL, PO Take by mouth.     No current facility-administered medications on file prior to visit.     Review of Systems:  As per HPI- otherwise  negative. No chest pain or shortness of breath No rash  Physical Examination: Vitals:   07/30/19 1502  BP: (!) 152/92  Pulse: 82  Resp: 17  Temp: (!) 96.3 F (35.7 C)  SpO2: 98%   Vitals:   07/30/19 1502  Weight: 243 lb (110.2 kg)  Height: 5\' 4"  (1.626 m)   Body mass index  is 41.71 kg/m. Ideal Body Weight: Weight in (lb) to have BMI = 25: 145.3  GEN: WDWN, NAD, Non-toxic, A & O x 3, obese, looks well  HEENT: Atraumatic, Normocephalic. Neck supple. No masses, No LAD.  TM within normal limits Ears and Nose: No external deformity. CV: RRR, No M/G/R. No JVD. No thrill. No extra heart sounds. PULM: CTA B, no wheezes, crackles, rhonchi. No retractions. No resp. distress. No accessory muscle use. ABD: S, NT, ND, +BS. No rebound. No HSM.  Belly is benign Pelvic: normal external genitals, no vaginal lesions or discharge.  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  However no purulent discharge is observed.  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 no adnexal masses EXTR: No c/c/e NEURO Normal gait.  PSYCH: Normally interactive. Conversant. Not depressed or anxious appearing.  Calm demeanor.   Results for orders placed or performed in visit on 07/30/19  Urine Culture   Specimen: Urine  Result Value Ref Range   MICRO NUMBER: [id number]    SPECIMEN QUALITY: Adequate    Sample Source URINE    STATUS: FINAL    Result:      Growth of mixed flora was isolated, suggesting probable contamination. No further testing will be performed. If clinically indicated, recollection using a method to minimize contamination, with prompt transfer to Urine Culture Transport Tube, is  recommended.   CBC  Result Value Ref Range   WBC 6.3 4.0 - 10.5 K/uL   RBC 4.09 3.87 - 5.11 Mil/uL   Platelets 371.0 150.0 - 400.0 K/uL   Hemoglobin 11.5 (L) 12.0 - 15.0 g/dL   HCT 35.3 (L) 36.0 - 46.0 %   MCV 86.3 78.0 - 100.0 fl   MCHC 32.5 30.0 - 36.0 g/dL    RDW 16.8 (H) 11.5 - 15.5 %  TSH  Result Value Ref Range   TSH 1.25 0.35 - 4.50 uIU/mL  HIV Antibody (routine testing w rflx)  Result Value Ref Range   HIV 1&2 Ab, 4th Generation NON-REACTIVE NON-REACTI  RPR  Result Value Ref Range   RPR Ser Ql NON-REACTIVE NON-REACTI  Hepatitis B surface antibody,quantitative  Result Value Ref Range   Hepatitis B-Post 17 > OR = 10 mIU/mL  Hepatitis B surface antigen  Result Value Ref Range   Hepatitis B Surface Ag NON-REACTIVE NON-REACTI  B Nat Peptide  Result Value Ref Range   Pro B Natriuretic peptide (BNP) 28.0 0.0 - 100.0 pg/mL  POCT urinalysis dipstick  Result Value Ref Range   Color, UA yellow yellow   Clarity, UA cloudy (A) clear   Glucose, UA negative negative mg/dL   Bilirubin, UA negative negative   Ketones, POC UA negative negative mg/dL   Spec Grav, UA 1.015 1.010 - 1.025   Blood, UA negative negative   pH, UA 7.0 5.0 - 8.0   Protein Ur, POC trace (A) negative mg/dL   Urobilinogen, UA 0.2 0.2 or 1.0 E.U./dL   Nitrite, UA Negative Negative   Leukocytes, UA Negative Negative  POCT urine pregnancy  Result Value Ref Range   Preg Test, Ur Negative Negative  Cervicovaginal ancillary only( [place])  Result Value Ref Range   Neisseria Gonorrhea Negative    Chlamydia Negative    Trichomonas Negative    Bacterial Vaginitis (gardnerella) Negative    Comment      Normal Reference Range Bacterial Vaginosis - Negative   Comment Normal Reference Range  Trichomonas - Negative    Comment Normal Reference [place] Chlamydia - Negative    Comment      Normal Reference Range Neisseria Gonorrhea - Negative    Assessment and Plan: Acute cystitis without hematuria - Plan: Urine Culture, POCT urinalysis dipstick, POCT urine pregnancy, fluconazole (DIFLUCAN) 150 MG tablet, cephALEXin (KEFLEX) 500 MG capsule, Ambulatory referral to Urology, DISCONTINUED: cephALEXin (KEFLEX) 500 MG capsule, CANCELED: Ambulatory referral to Urology  Pelvic pain  - Plan: Cervicovaginal ancillary only( [place]), cefTRIAXone (ROCEPHIN) injection 250 mg, doxycycline (VIBRAMYCIN) 100 MG capsule, CBC  Weight gain - Plan: TSH, B Nat Peptide  Routine screening for STI (sexually transmitted infection) - Plan: HIV Antibody (routine testing w rflx), RPR, Hepatitis B surface antibody,quantitative, Hepatitis B surface antigen  Hepatitis B immune  Here today with a few concerns.  Patient concerned that she has a UTI, but her UA is relatively benign.  Her exam and history is suspicious for PID.  Urine culture is pending, gonorrhea chlamydia probe pending Given IM Rocephin, doxycycline today for cover for PID.  We will also start on cephalexin pending urine culture CBC pending for Rinella count She would like blood work for STI screening as well, as above. She has noted weight gain ankle swelling.  Will check TSH and BMP  I will follow-up with her as labs become available.  I have asked her to seek care if she is getting worse or has any other concerns  >45 minutes spent in face to face time with patient, >50% spent in counselling or coordination of care   Meds ordered this encounter  Medications  . cefTRIAXone (ROCEPHIN) injection 250 mg    Order Specific Question:   Antibiotic Indication:    Answer:   Other Indication (list below)    Order Specific Question:   Other Indication:    Answer:   pid  . doxycycline (VIBRAMYCIN) 100 MG capsule    Sig: Take 1 capsule (100 mg total) by mouth 2 (two) times daily.    Dispense:  28 capsule    Refill:  0  . DISCONTD: cephALEXin (KEFLEX) 500 MG capsule    Sig: Take 1 capsule (500 mg total) by mouth 2 (two) times daily for 7 days.    Dispense:  14 capsule    Refill:  0  . fluconazole (DIFLUCAN) 150 MG tablet    Sig: Take 1 tablet (150 mg total) by mouth once for 1 dose. Repeat in one week if needed    Dispense:  2 tablet    Refill:  0  . cephALEXin (KEFLEX) 500 MG capsule    Sig: Take 1 capsule (500 mg total) by  mouth 2 (two) times daily for 7 days.    Dispense:  14 capsule    Refill:  0     Signed Lamar Blinks, MD  Addendum 10/24, received her labs as below Message to patient  Results for orders placed or performed in visit on 07/30/19  Urine Culture   Specimen: Urine  Result Value Ref Range   MICRO NUMBER: [id number]    SPECIMEN QUALITY: Adequate    Sample Source URINE    STATUS: FINAL    Result:      Growth of mixed flora was isolated, suggesting probable contamination. No further testing will be performed. If clinically indicated, recollection using a method to minimize contamination, with prompt transfer to Urine Culture Transport Tube, is  recommended.   CBC  Result Value Ref Range   WBC 6.3  4.0 - 10.5 K/uL   RBC 4.09 3.87 - 5.11 Mil/uL   Platelets 371.0 150.0 - 400.0 K/uL   Hemoglobin 11.5 (L) 12.0 - 15.0 g/dL   HCT 35.3 (L) 36.0 - 46.0 %   MCV 86.3 78.0 - 100.0 fl   MCHC 32.5 30.0 - 36.0 g/dL   RDW 16.8 (H) 11.5 - 15.5 %  TSH  Result Value Ref Range   TSH 1.25 0.35 - 4.50 uIU/mL  HIV Antibody (routine testing w rflx)  Result Value Ref Range   HIV 1&2 Ab, 4th Generation NON-REACTIVE NON-REACTI  RPR  Result Value Ref Range   RPR Ser Ql NON-REACTIVE NON-REACTI  Hepatitis B surface antibody,quantitative  Result Value Ref Range   Hepatitis B-Post 17 > OR = 10 mIU/mL  Hepatitis B surface antigen  Result Value Ref Range   Hepatitis B Surface Ag NON-REACTIVE NON-REACTI  B Nat Peptide  Result Value Ref Range   Pro B Natriuretic peptide (BNP) 28.0 0.0 - 100.0 pg/mL  POCT urinalysis dipstick  Result Value Ref Range   Color, UA yellow yellow   Clarity, UA cloudy (A) clear   Glucose, UA negative negative mg/dL   Bilirubin, UA negative negative   Ketones, POC UA negative negative mg/dL   Spec Grav, UA 1.015 1.010 - 1.025   Blood, UA negative negative   pH, UA 7.0 5.0 - 8.0   Protein Ur, POC trace (A) negative mg/dL   Urobilinogen, UA 0.2 0.2 or 1.0 E.U./dL    Nitrite, UA Negative Negative   Leukocytes, UA Negative Negative  POCT urine pregnancy  Result Value Ref Range   Preg Test, Ur Negative Negative  Cervicovaginal ancillary only( [place])  Result Value Ref Range   Neisseria Gonorrhea Negative    Chlamydia Negative    Trichomonas Negative    Bacterial Vaginitis (gardnerella) Negative    Comment      Normal Reference Range Bacterial Vaginosis - Negative   Comment Normal Reference Range Trichomonas - Negative    Comment Normal Reference [place] Chlamydia - Negative    Comment      Normal Reference Range Neisseria Gonorrhea - Negative    Your urine culture was negative.  This means you can stop taking the Keflex at this time Blood counts are normal except for minimal anemia.  Please discuss this with [name] at your next visit.  I would suggest that you repeat this in a few months and see if it persists.  Thyroid normal HIV negative Syphilis negative You are immune to hepatitis B BNP is normal, no sign of heart failure  I am still waiting on your gonorrhea/chlamydia test, will be in touch with this ASAP  I hope that you are feeling better, please let me know if you are not!   Addendum 10/30, received her vaginal swab-all negative Results released to patient

## 2019-07-30 NOTE — Patient Instructions (Addendum)
It was good to see you today, I will be in touch with your urine culture ASAP We are not sure yet if you have PID or a UTI We are going to treat you for both for now Rocephin shot today Doxycycline twice a day for 14 days Keflex twice a day for one week for UTI  I will be in touch as soon as we get more information from your labs  If you are NOT ok- severe pain, fever, vomiting- please to the ER I will also check your thyroid and BNP to look for any cause of your weight gain

## 2019-07-31 LAB — RPR: RPR Ser Ql: NONREACTIVE

## 2019-07-31 LAB — URINE CULTURE
MICRO NUMBER:: 1018836
SPECIMEN QUALITY:: ADEQUATE

## 2019-07-31 LAB — CBC
HCT: 35.3 % — ABNORMAL LOW (ref 36.0–46.0)
Hemoglobin: 11.5 g/dL — ABNORMAL LOW (ref 12.0–15.0)
MCHC: 32.5 g/dL (ref 30.0–36.0)
MCV: 86.3 fl (ref 78.0–100.0)
Platelets: 371 10*3/uL (ref 150.0–400.0)
RBC: 4.09 Mil/uL (ref 3.87–5.11)
RDW: 16.8 % — ABNORMAL HIGH (ref 11.5–15.5)
WBC: 6.3 10*3/uL (ref 4.0–10.5)

## 2019-07-31 LAB — HEPATITIS B SURFACE ANTIGEN: Hepatitis B Surface Ag: NONREACTIVE

## 2019-07-31 LAB — BRAIN NATRIURETIC PEPTIDE: Pro B Natriuretic peptide (BNP): 28 pg/mL (ref 0.0–100.0)

## 2019-07-31 LAB — HEPATITIS B SURFACE ANTIBODY, QUANTITATIVE: Hep B S AB Quant (Post): 17 m[IU]/mL (ref >=10)

## 2019-07-31 LAB — TSH: TSH: 1.25 u[IU]/mL (ref 0.35–4.50)

## 2019-07-31 LAB — HIV ANTIBODY (ROUTINE TESTING W REFLEX): HIV 1&2 Ab, 4th Generation: NONREACTIVE

## 2019-08-01 ENCOUNTER — Encounter: Payer: Self-pay | Admitting: Family Medicine

## 2019-08-01 DIAGNOSIS — Z789 Other specified health status: Secondary | ICD-10-CM

## 2019-08-01 HISTORY — DX: Other specified health status: Z78.9

## 2019-08-06 ENCOUNTER — Ambulatory Visit: Payer: 59 | Admitting: Family

## 2019-08-06 ENCOUNTER — Telehealth: Payer: Self-pay | Admitting: Family

## 2019-08-06 ENCOUNTER — Other Ambulatory Visit: Payer: Self-pay

## 2019-08-06 LAB — CERVICOVAGINAL ANCILLARY ONLY
Bacterial Vaginitis (gardnerella): NEGATIVE
Chlamydia: NEGATIVE
Comment: NEGATIVE
Comment: NEGATIVE
Comment: NEGATIVE
Comment: NORMAL
Neisseria Gonorrhea: NEGATIVE
Trichomonas: NEGATIVE

## 2019-08-06 NOTE — Telephone Encounter (Signed)
Patient called to reschedule to tomorrow due to a severe headache. She also states she has swelling in both ankles.

## 2019-08-06 NOTE — Progress Notes (Deleted)
    Office Visit    Patient Name: Destiny Henderson Date of Encounter: 08/06/2019  Primary Care Provider:  Shelda Pal, DO Primary Cardiologist:  No primary care provider on file. Electrophysiologist:  None   Chief Complaint    Destiny Henderson is a 58 y.o. female with a hx of *** presents today for ***   Past Medical History    Past Medical History:  Diagnosis Date  . Anemia   . Benign tumor of bladder   . DVT (deep venous thrombosis) ([place])   . Essential hypertension, benign 03/03/2013  . Fibroma of heart   . Obesity 03/03/2013   Past Surgical History:  Procedure Laterality Date  . BACK SURGERY    . bladder tack    . DILATION AND CURETTAGE OF UTERUS     after SAB  . HEART TUMOR EXCISION      Allergies  Allergies  Allergen Reactions  . Latex Rash    Other reaction(s): Chest Pain  . Lisinopril Swelling    Angioedema   . Lasix [Furosemide] Rash and Other (See Comments)    Shortness of breath  . Prednisone Rash  . Sulfur Rash    History of Present Illness    Destiny Henderson is a 58 y.o. female with a hx of *** last seen ***.  EKGs/Labs/Other Studies Reviewed:   The following studies were reviewed today: ***  EKG:  EKG is *** ordered today.  The ekg ordered today demonstrates ***  Recent Labs: 05/21/2019: ALT 15; BUN 11; Creatinine, Ser 0.99; Potassium 4.2; Sodium 136 07/30/2019: Hemoglobin 11.5; Platelets 371.0; Pro B Natriuretic peptide (BNP) 28.0; TSH 1.25  Recent Lipid Panel No results found for: CHOL, TRIG, HDL, CHOLHDL, VLDL, LDLCALC, LDLDIRECT  Home Medications   No outpatient medications have been marked as taking for the 08/06/19 encounter (Appointment) with Loel Dubonnet, NP.      Review of Systems    ***   ROS All other systems reviewed and are otherwise negative except as noted above.  Physical Exam    VS:  There were no vitals taken for this visit. , BMI There is no height or weight on file to  calculate BMI. GEN: Well nourished, well developed, in no acute distress. HEENT: normal. Neck: Supple, no JVD, carotid bruits, or masses. Cardiac: ***RRR, no murmurs, rubs, or gallops. No clubbing, cyanosis, edema.  ***Radials/DP/PT 2+ and equal bilaterally.  Respiratory:  ***Respirations regular and unlabored, clear to auscultation bilaterally. GI: Soft, nontender, nondistended, BS + x 4. MS: No deformity or atrophy. Skin: Warm and dry, no rash. Neuro:  Strength and sensation are intact. Psych: Normal affect.  Accessory Clinical Findings    ECG personally reviewed by me today - *** - no acute changes.  Assessment & Plan    1. ***  Disposition: Follow up {follow up:15908} with ***   Loel Dubonnet, NP 08/06/2019, 8:37 AM

## 2019-08-07 ENCOUNTER — Ambulatory Visit (INDEPENDENT_AMBULATORY_CARE_PROVIDER_SITE_OTHER): Payer: 59 | Admitting: Family Medicine

## 2019-08-07 ENCOUNTER — Encounter: Payer: Self-pay | Admitting: Family Medicine

## 2019-08-07 ENCOUNTER — Encounter: Payer: Self-pay | Admitting: Family

## 2019-08-07 ENCOUNTER — Telehealth: Payer: Self-pay

## 2019-08-07 ENCOUNTER — Ambulatory Visit (INDEPENDENT_AMBULATORY_CARE_PROVIDER_SITE_OTHER): Payer: 59 | Admitting: Family

## 2019-08-07 VITALS — BP 180/108 | HR 66 | Ht 64.0 in | Wt 241.0 lb

## 2019-08-07 VITALS — BP 134/82 | HR 97 | Temp 97.2°F | Ht 64.0 in | Wt 242.0 lb

## 2019-08-07 DIAGNOSIS — R0689 Other abnormalities of breathing: Secondary | ICD-10-CM | POA: Diagnosis not present

## 2019-08-07 DIAGNOSIS — G479 Sleep disorder, unspecified: Secondary | ICD-10-CM

## 2019-08-07 DIAGNOSIS — Z0000 Encounter for general adult medical examination without abnormal findings: Secondary | ICD-10-CM | POA: Diagnosis not present

## 2019-08-07 DIAGNOSIS — R600 Localized edema: Secondary | ICD-10-CM | POA: Diagnosis not present

## 2019-08-07 DIAGNOSIS — R5383 Other fatigue: Secondary | ICD-10-CM

## 2019-08-07 DIAGNOSIS — Z23 Encounter for immunization: Secondary | ICD-10-CM | POA: Diagnosis not present

## 2019-08-07 DIAGNOSIS — I10 Essential (primary) hypertension: Secondary | ICD-10-CM | POA: Diagnosis not present

## 2019-08-07 LAB — COMPREHENSIVE METABOLIC PANEL
ALT: 12 U/L (ref 0–35)
AST: 17 U/L (ref 0–37)
Albumin: 3.9 g/dL (ref 3.5–5.2)
Alkaline Phosphatase: 67 U/L (ref 39–117)
BUN: 12 mg/dL (ref 6–23)
CO2: 31 mEq/L (ref 19–32)
Calcium: 9.2 mg/dL (ref 8.4–10.5)
Chloride: 107 mEq/L (ref 96–112)
Creatinine, Ser: 1 mg/dL (ref 0.40–1.20)
GFR: 69.17 mL/min (ref >60.00)
Glucose, Bld: 99 mg/dL (ref 70–99)
Potassium: 3.8 mEq/L (ref 3.5–5.1)
Sodium: 145 mEq/L (ref 135–145)
Total Bilirubin: 0.3 mg/dL (ref 0.2–1.2)
Total Protein: 7.2 g/dL (ref 6.0–8.3)

## 2019-08-07 LAB — LIPID PANEL
Cholesterol: 201 mg/dL — ABNORMAL HIGH (ref 0–200)
HDL: 45.2 mg/dL (ref >39.00)
LDL Cholesterol: 133 mg/dL — ABNORMAL HIGH (ref 0–99)
NonHDL: 155.59
Total CHOL/HDL Ratio: 4
Triglycerides: 112 mg/dL (ref 0.0–149.0)
VLDL: 22.4 mg/dL (ref 0.0–40.0)

## 2019-08-07 MED ORDER — DILTIAZEM HCL ER COATED BEADS 300 MG PO CP24: 300.0000 mg | ORAL_CAPSULE | Freq: Every day | ORAL | 2 refills | Status: DC

## 2019-08-07 MED ORDER — SAXENDA 18 MG/3ML ~~LOC~~ SOPN: 0.6000 mg | PEN_INJECTOR | Freq: Every day | SUBCUTANEOUS | 2 refills | Status: AC

## 2019-08-07 MED FILL — SAXENDA 18 MG/3 ML PEN: 18 | 30 days supply | Qty: 15 | Fill #0

## 2019-08-07 MED FILL — CARTIA XT 300 MG CAPSULE SA: 300 | 30 days supply | Qty: 30 | Fill #0

## 2019-08-07 MED FILL — ULTICARE PEN NDL 8MM 31G: 31G X 8 MM | 30 days supply | Qty: 100 | Fill #0

## 2019-08-07 NOTE — Patient Instructions (Signed)
Give [name] 2-3 business days to get the results of your labs back.   Keep the diet clean and stay active.  Aim to do some physical exertion for 150 minutes per week. This is typically divided into 5 days per week, 30 minutes per day. The activity should be enough to get your heart rate up. Anything is better than nothing if you have time constraints. Lift weights and consider yoga.  If you do not hear anything about your referrals in the next 1-2 weeks, call our office and ask for an update.  Let us know if you need anything.

## 2019-08-07 NOTE — Telephone Encounter (Signed)
PA initiated via Covermymeds; KEY: [id number]. PA approved.   CaseId:[id number];Status:Approved;Review Type:Prior Auth;Coverage Start Date:07/08/2019;Coverage End Date:12/05/2019

## 2019-08-07 NOTE — Progress Notes (Signed)
Office Visit    Patient Name: Destiny Henderson Date of Encounter: 08/07/2019  Primary Care Provider:  Shelda Pal, DO Primary Cardiologist:  Jenne Campus, MD Electrophysiologist:  None   Chief Complaint    Destiny Henderson is a 58 y.o. female with a hx of HTN presents today for follow up after medication changes for HTN.   Past Medical History    Past Medical History:  Diagnosis Date  . Anemia   . Benign tumor of bladder   . DVT (deep venous thrombosis) ([place])   . Essential hypertension, benign 03/03/2013  . Fibroma of heart   . Obesity 03/03/2013   Past Surgical History:  Procedure Laterality Date  . BACK SURGERY    . bladder tack    . DILATION AND CURETTAGE OF UTERUS     after SAB  . HEART TUMOR EXCISION      Allergies  Allergies  Allergen Reactions  . Latex Rash    Other reaction(s): Chest Pain  . Lisinopril Swelling    Angioedema   . Lasix [Furosemide] Rash and Other (See Comments)    Shortness of breath  . Prednisone Rash  . Sulfur Rash    History of Present Illness    Destiny Henderson is a 58 y.o. female with a hx of HTN, HLD, obesity, removal of benign tumor from heart in the 90s last seen by Dr. Agustin Cree 06/25/19. Recent ankle fracture with noted LE edema. At her last office visit she was started on Spironolactone 12.5mg  daily.  Seen at her PCP office 07/30/19 and started on abx for PID. Labs 07/30/19 with Hb 11.5, ProBNP 28, TSH 1.25. Seen by her PCP today. Tells me he ordered a sleep study and her labs were checked including potassium.   Very pleasant lady who is understandably frustrated with the difficulty of her blood pressure management. Her daughter is an NP with oncology purchased her a BP cuff and compression socks recently. She checks her blood pressure regularly at home with readings 200/104 and 189/100 with an automatic arm blood pressure cuff.   She follows a low sodium diet as a pescatarian. Endorses  feeling "off balance" and having falls despite working with PT for her previous R ankle fracture. She has 17 steps into her apartment and notes DOE with this. She tells me she has upcoming appointment with her eye doctor and a referral into urology.  BP on my recheck was 160/92.  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   EKGs/Labs/Other Studies Reviewed:   The following studies were reviewed today:  Echo 12/11/18  1. The left ventricle has normal systolic function with an ejection fraction of 60-65%. The cavity size was normal. Left ventricular diastolic Doppler parameters are consistent with impaired relaxation.  2. The right ventricle has normal systolic function. The cavity was normal. There is no increase in right ventricular wall thickness.  3. Left atrial size was mildly dilated.  4. The mitral valve is normal in structure.  5. The tricuspid valve is normal in structure.  6. The aortic valve is normal in structure.  7. The pulmonic valve was normal in structure.  EKG:  No EKG today.  Recent Labs: 05/21/2019: ALT 15; BUN 11; Creatinine, Ser 0.99; Potassium 4.2; Sodium 136 07/30/2019: Hemoglobin 11.5; Platelets 371.0; Pro B Natriuretic peptide (BNP) 28.0; TSH 1.25  Recent Lipid Panel No results  found for: CHOL, TRIG, HDL, CHOLHDL, VLDL, LDLCALC, LDLDIRECT  Home Medications   Current Meds  Medication Sig  . aspirin EC 81 MG tablet Take 1 tablet (81 mg total) by mouth 2 (two) times daily.  Marland Kitchen b complex vitamins capsule Take 1 capsule by mouth daily.  . Coenzyme Q10 (CO Q 10 PO) Take by mouth.  . doxycycline (VIBRAMYCIN) 100 MG capsule Take 1 capsule (100 mg total) by mouth 2 (two) times daily.  . fish oil-omega-3 fatty acids 1000 MG capsule Take 2 g by mouth daily.  . Flaxseed, Linseed, 1000 MG CAPS Take by mouth.  . furosemide (LASIX) 40 MG tablet Take 1 tablet (40 mg total) by mouth daily.  Marland Kitchen ibuprofen  (ADVIL) 800 MG tablet Take 800 mg by mouth every 8 (eight) hours as needed (pain).  . Liraglutide -Weight Management (SAXENDA) 18 MG/3ML SOPN Inject 0.6 mg into the skin daily for 7 days. Then 1.2 mg/day for 7 d, then 1.8 mg/d for 7 d, then 2.4 mg/d for 7 d, then 3.0 mg/d.  . Melatonin 5 MG CAPS Take by mouth as needed.   . Multiple Vitamins-Minerals (MEGA MULTIVITAMIN FOR WOMEN) TABS Take by mouth.  . potassium chloride (K-DUR) 10 MEQ tablet Take 1 tablet (10 mEq total) by mouth daily.  . Pyridoxine HCl (VITAMIN B-6) 500 MG tablet Take 500 mg by mouth daily.  Marland Kitchen spironolactone (ALDACTONE) 25 MG tablet Take 0.5 tablets (12.5 mg total) by mouth daily.  Marland Kitchen telmisartan-hydrochlorothiazide (MICARDIS HCT) 80-25 MG tablet Take 1 tablet by mouth daily.  . vitamin B-12 (CYANOCOBALAMIN) 1000 MCG tablet Take 1,000 mcg by mouth daily.  . vitamin C (ASCORBIC ACID) 500 MG tablet Take 500 mg by mouth daily.  Marland Kitchen VITAMIN D, CHOLECALCIFEROL, PO Take by mouth.  . [DISCONTINUED] diltiazem (DILACOR XR) 240 MG 24 hr capsule Take 1 capsule (240 mg total) by mouth daily.      Review of Systems       Review of Systems  Constitution: Negative for chills, fever and malaise/fatigue.  Cardiovascular: Positive for dyspnea on exertion and leg swelling. Negative for chest pain, irregular heartbeat, near-syncope and palpitations.  Respiratory: Positive for sleep disturbances due to breathing (wheezes at night). Negative for cough, shortness of breath and wheezing.   Musculoskeletal: Positive for falls.       "off balance"  Gastrointestinal: Negative for nausea and vomiting.  Neurological: Negative for dizziness, light-headedness and weakness.   All other systems reviewed and are otherwise negative except as noted above.  Physical Exam    VS:  BP (!) 180/108 (BP Location: Left Arm, Patient Position: Sitting, Cuff Size: Normal)   Pulse 66   Ht 5\' 4"  (1.626 m)   Wt 241 lb (109.3 kg)   SpO2 97%   BMI 41.37 kg/m  , BMI  Body mass index is 41.37 kg/m. GEN: Well nourished, well developed, in no acute distress. HEENT: normal. Neck: Supple, no JVD, carotid bruits, or masses. Cardiac: RRR, no murmurs, rubs, or gallops. No clubbing, cyanosis. PT 2+ and equal bilaterally.  Trace edema bilateral LE. Respiratory:  Respirations regular and unlabored, clear to auscultation bilaterally. GI: Soft, nontender, nondistended. MS: No deformity or atrophy.  Skin: Warm and dry, no rash. Neuro:  Strength and sensation are intact. Psych: Normal affect.  Assessment & Plan    1. Resistant hypertension - Difficult to control despite multiple antihypertensive agents. Family history of hypertension. Checks at home with readings up to 200/104. Discussed gradual reduction of BP.  Increase Cardizem from 240mg  to 300mg  today. She has had normal thyroid studies. Sleep study ordered by her PCP today to rule out OSA (notes daytime sleepiness and wheeze while sleeping). Renal artery duplex ordered today to rule out renal artery stenosis as cause of resistant HTN.  2. Bilateral lower extremity - Echo 12/2018 with normal LVEF and impaired LV relaxation. Trace edema bilateral LE on exam. Likely combined etiology venous insufficiency and hypertension. Recommend low salt diet. Recommend compression stocking. Recommend elevate lower extremities. Continue present loop diuretic and MRA.  3. Sleep disturbance - Wheeze in sleep. Noted daytime sleepiness. Sleep study ordered by PCP. May be contributory to resistant HTN.  Disposition: Follow up in 4-6 weeks with Dr. Agustin Cree via virtual visit   Loel Dubonnet, NP 08/07/2019, 3:38 PM

## 2019-08-07 NOTE — Progress Notes (Signed)
Chief Complaint  Patient presents with  . Annual Exam     Well Woman Destiny Henderson is here for a complete physical.   Her last physical was >1 year ago.  Current diet: in general, a "healthy" diet. Current exercise: walking. Weight is increasing and she confirms daytime fatigue. No LMP recorded. (Menstrual status: IUD). Seatbelt? Yes  She has been frustrated with weight gain.  She reports a very healthy diet and plenty of walking.  No weight training exercise.  She was on phentermine in the past that did help but she gained weight after coming off of it.  She reports that her insurance will cover any weight loss medication.  Health Maintenance Pap/HPV- Yes Mammogram- Yes Colon cancer screening-Yes Shingrix- No Tetanus- No Hep C screening- Yes HIV screening- Yes  Past Medical History:  Diagnosis Date  . Anemia   . Benign tumor of bladder   . DVT (deep venous thrombosis) ([place])   . Essential hypertension, benign 03/03/2013  . Fibroma of heart   . Obesity 03/03/2013    Past Surgical History:  Procedure Laterality Date  . BACK SURGERY    . bladder tack    . DILATION AND CURETTAGE OF UTERUS     after SAB  . HEART TUMOR EXCISION      Medications  Current Outpatient Medications on File Prior to Visit  Medication Sig Dispense Refill  . aspirin EC 81 MG tablet Take 1 tablet (81 mg total) by mouth 2 (two) times daily. 42 tablet 0  . b complex vitamins capsule Take 1 capsule by mouth daily.    . Coenzyme Q10 (CO Q 10 PO) Take by mouth.    . diltiazem (DILACOR XR) 240 MG 24 hr capsule Take 1 capsule (240 mg total) by mouth daily. 90 capsule 1  . doxycycline (VIBRAMYCIN) 100 MG capsule Take 1 capsule (100 mg total) by mouth 2 (two) times daily. 28 capsule 0  . fish oil-omega-3 fatty acids 1000 MG capsule Take 2 g by mouth daily.    . Flaxseed, Linseed, 1000 MG CAPS Take by mouth.    . furosemide (LASIX) 40 MG tablet Take 1 tablet (40 mg total) by mouth daily. 90 tablet 1   . Melatonin 5 MG CAPS Take by mouth as needed.     . Multiple Vitamins-Minerals (MEGA MULTIVITAMIN FOR WOMEN) TABS Take by mouth.    . potassium chloride (K-DUR) 10 MEQ tablet Take 1 tablet (10 mEq total) by mouth daily. 90 tablet 1  . Pyridoxine HCl (VITAMIN B-6) 500 MG tablet Take 500 mg by mouth daily.    Marland Kitchen spironolactone (ALDACTONE) 25 MG tablet Take 0.5 tablets (12.5 mg total) by mouth daily. 30 tablet 1  . telmisartan-hydrochlorothiazide (MICARDIS HCT) 80-25 MG tablet Take 1 tablet by mouth daily. 90 tablet 1  . VITAMIN A OP Apply to eye.    . vitamin B-12 (CYANOCOBALAMIN) 1000 MCG tablet Take 1,000 mcg by mouth daily.    . vitamin C (ASCORBIC ACID) 500 MG tablet Take 500 mg by mouth daily.    Marland Kitchen VITAMIN D, CHOLECALCIFEROL, PO Take by mouth.     Allergies Allergies  Allergen Reactions  . Latex Rash    Other reaction(s): Chest Pain  . Lisinopril Swelling    Angioedema   . Lasix [Furosemide] Rash and Other (See Comments)    Shortness of breath  . Prednisone Rash  . Sulfur Rash    Review of Systems: Constitutional:  no unexpected weight changes  Eye:  no recent significant change in vision Ear/Nose/Mouth/Throat:  Ears:  no recent change in hearing Nose/Mouth/Throat:  no complaints of nasal congestion, no sore throat Cardiovascular: no chest pain Respiratory:  no shortness of breath Gastrointestinal:  no abdominal pain, no change in bowel habits GU:  Female: negative for dysuria or pelvic pain Musculoskeletal/Extremities:  no pain of the joints Integumentary (Skin/Breast):  no abnormal skin lesions reported Neurologic:  no headaches Endocrine:  denies fatigue Hematologic/Lymphatic:  No areas of easy bleeding  Exam BP 134/82 (BP Location: Left Arm, Patient Position: Sitting, Cuff Size: Large)   Pulse 97   Temp (!) 97.2 F (36.2 C) (Temporal)   Ht 5\' 4"  (1.626 m)   Wt 242 lb (109.8 kg)   SpO2 98%   BMI 41.54 kg/m  General:  well developed, well nourished, in no  apparent distress Skin:  no significant moles, warts, or growths Head:  no masses, lesions, or tenderness Eyes:  pupils equal and round, sclera anicteric without injection Ears:  canals without lesions, TMs shiny without retraction, no obvious effusion, no erythema Nose:  nares patent, septum midline, mucosa normal, and no drainage or sinus tenderness Throat/Pharynx:  lips and gingiva without lesion; tongue and uvula midline; non-inflamed pharynx; no exudates or postnasal drainage Neck: neck supple without adenopathy, thyromegaly, or masses Lungs:  clear to auscultation, breath sounds equal bilaterally, no respiratory distress Cardio:  regular rate and rhythm, no LE edema Abdomen:  abdomen soft, nontender; bowel sounds normal; no masses or organomegaly Genital: Defer to GYN Musculoskeletal:  symmetrical muscle groups noted without atrophy or deformity Extremities:  no clubbing, cyanosis, or edema, no deformities, no skin discoloration Neuro:  gait normal; deep tendon reflexes normal and symmetric Psych: well oriented with normal range of affect and appropriate judgment/insight  Assessment and Plan  Well adult exam - Plan: Lipid panel, Comprehensive metabolic panel  Morbid obesity ([place]) - Plan: Amb Ref to Medical Weight Management, Liraglutide -Weight Management (SAXENDA) 18 MG/3ML SOPN  Fatigue, unspecified type - Plan: Ambulatory referral to Pulmonology  Gasping for breath - Plan: Ambulatory referral to Pulmonology  Need for influenza vaccination - Plan: Flu Vaccine QUAD 6+ mos PF IM (Fluarix Quad PF)  Need for shingles vaccine - Plan: Varicella-zoster vaccine IM (Shingrix)  Need for Tdap vaccination - Plan: Tdap vaccine greater than or equal to 54yo IM   Well 58 y.o. female. Counseled on diet and exercise. Start Saxenda and refer to the medical weight loss team. She is gasping for breath in the middle of the night and is having fatigue.  Will refer to pulmonology/sleep team for  possible sleep study. Other orders as above. Follow up in 1 year for CPE. The patient voiced understanding and agreement to the plan.  [place], DO 08/07/19 2:38 PM

## 2019-08-07 NOTE — Patient Instructions (Signed)
Medication Instructions:  Your physician has recommended you make the following change in your medication: CHANGE Cardizem to 328m daily.  *If you need a refill on your cardiac medications before your next appointment, please call your pharmacy*  Lab Work: No lab work today.  If you have labs (blood work) drawn today and your tests are completely normal, you will receive your results only by: Marland Kitchen MyChart Message (if you have MyChart) OR . A paper copy in the mail If you have any lab test that is abnormal or we need to change your treatment, we will call you to review the results.  Testing/Procedures: We have ordered an ultrasound of your kidneys today to look for blockage that could be causing high blood pressure.   Follow-Up: At [facility], you and your health needs are our priority.  As part of our continuing mission to provide you with exceptional heart care, we have created designated Provider Care Teams.  These Care Teams include your primary Cardiologist (physician) and Advanced Practice Providers (APPs -  Physician Assistants and Nurse Practitioners) who all work together to provide you with the care you need, when you need it.  Your next appointment:   4-6 weeks  The format for your next appointment:   Virtual Visit   Provider:   Jenne Campus, MD  Other Instructions  Would recommend elevating your lower extremities.   Recommend continuing low salt diet.   Recommend checking blood pressure daily and keeping a log.   Common 3 causes of difficult to control blood pressure are (1) thyroid problems (2) blockages in your renal aka kidneys arteries (3) sleep apnea. Your thyroid labs were normal recently. [name] has ordered a sleep study. We have ordered an ultrasound of your kidneys.

## 2019-08-10 ENCOUNTER — Other Ambulatory Visit: Payer: Self-pay | Admitting: Family Medicine

## 2019-08-10 ENCOUNTER — Telehealth: Payer: Self-pay | Admitting: Family Medicine

## 2019-08-10 DIAGNOSIS — E785 Hyperlipidemia, unspecified: Secondary | ICD-10-CM

## 2019-08-10 NOTE — Telephone Encounter (Signed)
Copied from [place] [id number]. Topic: Quick Communication - Lab Results (Clinic Use ONLY) >> Aug 10, 2019 10:11 AM Robina Ade, Helene Kelp D wrote: Patient is calling wanting to speak with someone about her lab results. Please call patient back, thanks.   Have spoken to her already.

## 2019-08-13 ENCOUNTER — Ambulatory Visit: Payer: 59 | Admitting: Family Medicine

## 2019-08-13 ENCOUNTER — Ambulatory Visit (HOSPITAL_BASED_OUTPATIENT_CLINIC_OR_DEPARTMENT_OTHER): Admission: RE | Admit: 2019-08-13 | Payer: 59 | Source: Ambulatory Visit

## 2019-08-13 ENCOUNTER — Ambulatory Visit: Payer: 59 | Admitting: Orthopaedic Surgery

## 2019-08-14 ENCOUNTER — Other Ambulatory Visit: Payer: Self-pay

## 2019-08-14 ENCOUNTER — Ambulatory Visit (INDEPENDENT_AMBULATORY_CARE_PROVIDER_SITE_OTHER): Payer: 59 | Admitting: Family Medicine

## 2019-08-14 ENCOUNTER — Ambulatory Visit (HOSPITAL_BASED_OUTPATIENT_CLINIC_OR_DEPARTMENT_OTHER)
Admission: RE | Admit: 2019-08-14 | Discharge: 2019-08-14 | Disposition: A | Discharge status: Home / Self Care | Payer: 59 | Source: Ambulatory Visit | Attending: Family | Admitting: Family

## 2019-08-14 ENCOUNTER — Encounter: Payer: Self-pay | Admitting: Family Medicine

## 2019-08-14 VITALS — BP 138/104 | HR 76 | Ht 64.0 in | Wt 237.0 lb

## 2019-08-14 DIAGNOSIS — I10 Essential (primary) hypertension: Secondary | ICD-10-CM | POA: Insufficient documentation

## 2019-08-14 DIAGNOSIS — G43809 Other migraine, not intractable, without status migrainosus: Secondary | ICD-10-CM

## 2019-08-14 DIAGNOSIS — N946 Dysmenorrhea, unspecified: Secondary | ICD-10-CM

## 2019-08-14 DIAGNOSIS — R300 Dysuria: Secondary | ICD-10-CM | POA: Diagnosis not present

## 2019-08-14 LAB — POCT URINALYSIS DIPSTICK
Glucose, UA: NEGATIVE
Leukocytes, UA: NEGATIVE
Nitrite, UA: NEGATIVE
Protein, UA: NEGATIVE
pH, UA: 6 (ref 5.0–8.0)

## 2019-08-14 NOTE — Progress Notes (Signed)
   Subjective:    Patient ID: Destiny Henderson ANIS CINELLI, female    DOB: 08-11-1961, 58 y.o.   MRN: [id number]  HPI Patient seen for questions of whether she should leave the IUD in. She reports no bleeding. Also having cyclical migraines.   No bleeding, vaginal dryness, hot flashes.   Review of Systems     Objective:   Physical Exam Constitutional:      Appearance: Normal appearance.  Pulmonary:     Effort: Pulmonary effort is normal.  Skin:    Capillary Refill: Capillary refill takes less than 2 seconds.  Neurological:     General: No focal deficit present.     Mental Status: She is alert and oriented to person, place, and time.       Assessment & Plan:  1. Dysuria No evidence of UTI - POCT Urinalysis Dipstick - Culture, OB Urine  2. Dysmenorrhea Controlled with IUD. No symptoms of menopause. Not having periods with IUD. No rush to remove at this point. She states that it was placed in [place] in 2017. Should be good until 2024.  3. Other migraine without status migrainosus, not intractable Patient to see neurologist. ? Hormone related as cyclical. Will reevaluate after neuro appt.

## 2019-08-14 NOTE — Progress Notes (Signed)
Renal doppler performed    08/14/19 Cardell Peach RDCS, RVT

## 2019-08-14 NOTE — Progress Notes (Signed)
Patient reports no bleeding/periods. Patient states she is having some dysuria and would like her urine checked. Kathrene Alu RN

## 2019-08-16 LAB — CULTURE, OB URINE

## 2019-08-16 LAB — URINE CULTURE, OB REFLEX

## 2019-08-18 ENCOUNTER — Telehealth: Payer: Self-pay

## 2019-08-18 NOTE — Telephone Encounter (Signed)
Called to relay test results. Left voice message requesting return call with callback number.

## 2019-08-20 ENCOUNTER — Other Ambulatory Visit: Payer: Self-pay

## 2019-08-20 ENCOUNTER — Ambulatory Visit: Payer: Self-pay

## 2019-08-20 ENCOUNTER — Ambulatory Visit (INDEPENDENT_AMBULATORY_CARE_PROVIDER_SITE_OTHER): Payer: 59 | Admitting: Orthopaedic Surgery

## 2019-08-20 ENCOUNTER — Encounter: Payer: Self-pay | Admitting: Orthopaedic Surgery

## 2019-08-20 VITALS — Ht 64.0 in | Wt 237.0 lb

## 2019-08-20 DIAGNOSIS — S8261XA Displaced fracture of lateral malleolus of right fibula, initial encounter for closed fracture: Secondary | ICD-10-CM | POA: Diagnosis not present

## 2019-08-20 MED ORDER — IBUPROFEN 800 MG PO TABS: 800.0000 mg | ORAL_TABLET | Freq: Three times a day (TID) | ORAL | 0 refills | Status: DC | PRN

## 2019-08-20 MED FILL — IBUPROFEN 800 MG TAB: 800 | 10 days supply | Qty: 30 | Fill #0

## 2019-08-20 NOTE — Progress Notes (Signed)
Office Visit Note   Patient: Destiny Henderson           Date of Birth: 1961-01-03           MRN: [id number] Visit Date: 08/20/2019              Requested by: Lorenda Hatchet, [place],   [id number] PCP: Shelda Pal, DO   Assessment & Plan: Visit Diagnoses:  1. Displaced fracture of lateral malleolus of right fibula, initial encounter for closed fracture     Plan: X-rays demonstrate a fully healed fibula fracture.  Reassurance was provided today that she may engage in any activity that she feels comfortable.  She should continue with PT for strengthening.  We will keep her out of work until she has completed PT.  She will call back when that time comes.  Advil was refilled today.  Questions encouraged and answered.  Follow-up as needed.  Follow-Up Instructions: Return if symptoms worsen or fail to improve.   Orders:  Orders Placed This Encounter  Procedures  . XR Ankle Complete Right   Meds ordered this encounter  Medications  . ibuprofen (ADVIL) 800 MG tablet    Sig: Take 1 tablet (800 mg total) by mouth every 8 (eight) hours as needed (pain).    Dispense:  30 tablet    Refill:  0      Procedures: No procedures performed   Clinical Data: No additional findings.   Subjective: Chief Complaint  Patient presents with  . Right Ankle - Follow-up    [name] is 4 months status post nondisplaced right fibula fracture.  She has continued to do PT.  She is walking more and she has some increased swelling with increased activity.  She mainly has some apprehension about resuming activities.   Review of Systems   Objective: Vital Signs: Ht 5\' 4"  (1.626 m)   Wt 237 lb (107.5 kg)   BMI 40.68 kg/m   Physical Exam  Ortho Exam Right ankle exam shows minimal swelling.  She is not having tenderness to palpation or withdrawing due to pain. Specialty Comments:  No specialty comments available.  Imaging: Xr Ankle Complete  Right  Result Date: 08/20/2019 None significant healing of the fracture and bony consolidation.    PMFS History: Patient Active Problem List   Diagnosis Date Noted  . Hepatitis B immune 08/01/2019  . IUD (intrauterine device) in place 06/03/2019  . LLQ pain 05/12/2019  . Amenorrhea, secondary 05/12/2019  . Dizziness 11/24/2018  . Atypical chest pain 05/21/2018  . History of iron deficiency anemia 05/21/2018  . Mixed hyperlipidemia 05/21/2018  . Low back pain radiating to right leg 02/17/2018  . Pedal edema 02/06/2018  . History of DVT (deep vein thrombosis) 02/06/2018  . Right wrist injury, subsequent encounter 01/24/2018  . Right knee injury, subsequent encounter 01/24/2018  . Sprain of MCL (medial collateral ligament) of knee 12/12/2016  . Sprain of right ankle 12/12/2016  . Menorrhagia 05/20/2013  . Essential hypertension, benign 03/03/2013  . Morbid obesity ([place]) 03/03/2013   Past Medical History:  Diagnosis Date  . Anemia   . Benign tumor of bladder   . DVT (deep venous thrombosis) ([place])   . Essential hypertension, benign 03/03/2013  . Fibroma of heart   . Obesity 03/03/2013    Family History  Problem Relation Age of Onset  . Breast cancer Mother   . Hypertension Mother   . Hyperlipidemia Mother   .  Colon cancer Father   . Cancer Father   . Thyroid disease Sister   . Cancer Other   . Thyroid disease Sister   . Hypertension Maternal Aunt   . Heart failure Maternal Aunt   . Cancer Maternal Aunt   . Hypertension Maternal Aunt   . Hypertension Maternal Uncle   . Diabetes Maternal Uncle   . Heart failure Maternal Grandmother   . Cancer Maternal Grandfather        prostate    Past Surgical History:  Procedure Laterality Date  . BACK SURGERY    . bladder tack    . DILATION AND CURETTAGE OF UTERUS     after SAB  . HEART TUMOR EXCISION     Social History   Occupational History  . Occupation: Network engineer  Tobacco Use  . Smoking status: Never Smoker  .  Smokeless tobacco: Never Used  Substance and Sexual Activity  . Alcohol use: No  . Drug use: No  . Sexual activity: Not on file

## 2019-08-21 ENCOUNTER — Telehealth: Payer: Self-pay

## 2019-08-21 DIAGNOSIS — G479 Sleep disorder, unspecified: Secondary | ICD-10-CM

## 2019-08-21 NOTE — Telephone Encounter (Signed)
Left voice message requesting return call to get recent procedure results

## 2019-08-21 NOTE — Telephone Encounter (Signed)
Patient called and informed of results of renal artery ultrasound. She's asking when sleep study will be. Noted PCP documented referral to Pulmonologist at last office visit in October but pt has not heard from anyone as of yet.  Informed pt I will follow-up with [facility] and call her back. She verbalizes understanding.

## 2019-08-24 ENCOUNTER — Telehealth: Payer: Self-pay | Admitting: Family Medicine

## 2019-08-24 NOTE — Telephone Encounter (Signed)
The pulmonary team is the specialty that deals with sleep issues. We can order a sleep test, but they are the team who interprets it and starts/tailors treatment if needed. Ty.

## 2019-08-24 NOTE — Telephone Encounter (Signed)
Called left message to call back 

## 2019-08-24 NOTE — Telephone Encounter (Signed)
Called informed of PCP instructions. She is ok with this but she needs the appt with Pulmonary before the end of the year/test as she has met her deductible for the year. I will followup with Gwen in the morning...she also has not gotten a call regarding her Urology referral.

## 2019-08-24 NOTE — Addendum Note (Signed)
Addended by: Polly Cobia A on: 08/24/2019 11:34 AM   Modules accepted: Orders

## 2019-08-24 NOTE — Telephone Encounter (Signed)
Phoned patient, left voice message requesting return call. Sleep study order has been placed, pre-cert request message sent.

## 2019-08-24 NOTE — Telephone Encounter (Signed)
Her PCP had referred her to pulmonology for sleep study. That referral is in the system and just in process of being scheduled. However, pulmonology is understandably a bit of a wait for initial appointment. If she would like [name] to order a sleep study, please do so and send message to 'CV div precert' and 'Wanda Wadell'. Thank you.

## 2019-08-24 NOTE — Telephone Encounter (Signed)
Copied from [place] [id number]. Topic: Referral - Status >> Aug 21, 2019  5:00 PM Selinda Flavin B, [place] wrote: Reason for CRM: Patient calling and states that she was referred to Pulmonary for a sleep study. Patient states that she does not want to see pulmonary. She wants the sleep study done at [facility]. Please advise.   Does Pulmonary not schedule them at [facility]?

## 2019-08-25 NOTE — Telephone Encounter (Signed)
Referral coordinator has taken care of both these referrals/appts scheduled.

## 2019-09-01 ENCOUNTER — Telehealth: Payer: Self-pay | Admitting: *Deleted

## 2019-09-01 NOTE — Telephone Encounter (Signed)
Sleep study PA submitted to [facility] via web portal.

## 2019-09-02 ENCOUNTER — Encounter: Payer: Self-pay | Admitting: Pulmonary Disease

## 2019-09-02 ENCOUNTER — Other Ambulatory Visit: Payer: Self-pay

## 2019-09-02 ENCOUNTER — Ambulatory Visit (INDEPENDENT_AMBULATORY_CARE_PROVIDER_SITE_OTHER): Payer: 59 | Admitting: Pulmonary Disease

## 2019-09-02 VITALS — BP 132/78 | HR 64 | Temp 98.4°F | Ht 64.0 in | Wt 234.8 lb

## 2019-09-02 DIAGNOSIS — R5381 Other malaise: Secondary | ICD-10-CM

## 2019-09-02 DIAGNOSIS — R0609 Other forms of dyspnea: Secondary | ICD-10-CM

## 2019-09-02 DIAGNOSIS — R0600 Dyspnea, unspecified: Secondary | ICD-10-CM

## 2019-09-02 DIAGNOSIS — G479 Sleep disorder, unspecified: Secondary | ICD-10-CM | POA: Diagnosis not present

## 2019-09-02 DIAGNOSIS — E669 Obesity, unspecified: Secondary | ICD-10-CM | POA: Diagnosis not present

## 2019-09-02 NOTE — Patient Instructions (Addendum)
Thank you for visiting Dr. Valeta Harms at [facility]. Today we recommend the following:  Your Sleep Study is scheduled for 09/19/2019 @ [facility]. [phone].   Location: [address], [place], [place] [address]  Your Covid Test is scheduled for 09/16/2019 @ 11:00am at [address].   Return if symptoms worsen or fail to improve, for w/ Jaynee Eagles for sleep consultation and follow up .    Please do your part to reduce the spread of COVID-19.

## 2019-09-02 NOTE — Progress Notes (Signed)
Synopsis: Referred in Nov 2020 for SOB by Shelda Pal*  Subjective:   PATIENT ID: Destiny Henderson GENDER: female DOB: 1961/06/06, MRN: [id number]  Chief Complaint  Patient presents with  . Consult    Consult SOB, fatigue, and sleep. She states SOB  with exertion developed this year. Denies chest pain. She reports fatigue for about 6 months. She was having thyroid levels checked. Sleep study ordered. States it has not been scheduled just in case she did not need one.      58 year old female past medical history of hypertension, DVT obesity. Started seeing a cardiologist in Feb with high blood pressure. Progressive SOB and DOE. Having trouble sleeping. She wakes up gasping for breath that's also new. Started two months ago. Lives alone. 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 Stays with mother in [place]. Currently living in [place]. Works for Art therapist. Nature conservation officer. Recently taken out of work for SOB and leg swelling. Start lasix and recently increased it.   Patient was referred here for a sleep evaluation and thought that she was having a sleep study completed today.  We discussed this today in detail and apologize for the inconvenience of being scheduled with the wrong provider.  However we did have an opportunity to discuss her progressive shortness of breath and physical deconditioning that she has been undergoing for the past several months.  Additionally talking with her she has insomnia.  She also has significant anxiety about everything that is going on related to Covid.  She also fell this past summer and broke her ankle.  She has been out of work for several weeks.  She currently lives alone and is very stressed regarding these things and has been much less active.  She is also having trouble with managing her blood pressure.  She is was told that she had neuropathy and was taking gabapentin and felt as if the medication  made her feel weird.  She feels that she has lots of side effects related to most medications she takes.   Past Medical History:  Diagnosis Date  . Anemia   . Benign tumor of bladder   . [place]   . Essential hypertension, benign 03/03/2013  . Fibroma of heart   . Obesity 03/03/2013     Family History  Problem Relation Age of Onset  . Breast cancer Mother   . Hypertension Mother   . Hyperlipidemia Mother   . Colon cancer Father   . Cancer Father   . Thyroid disease Sister   . Cancer Other   . Thyroid disease Sister   . Hypertension Maternal Aunt   . Heart failure Maternal Aunt   . Cancer Maternal Aunt   . Hypertension Maternal Aunt   . Hypertension Maternal Uncle   . Diabetes Maternal Uncle   . Heart failure Maternal Grandmother   . Cancer Maternal Grandfather        prostate     Past Surgical History:  Procedure Laterality Date  . BACK SURGERY    . bladder tack    . DILATION AND CURETTAGE OF UTERUS     after SAB  . HEART TUMOR EXCISION      Social History   Socioeconomic History  . Marital status: Married    Spouse name: Not on file  . Number of children: Not on file  . Years of education: Not on file  . Highest education level: Not  on file  Occupational History  . Occupation: Network engineer  Social Needs  . Financial resource strain: Not on file  . Food insecurity    Worry: Not on file    Inability: Not on file  . Transportation needs    Medical: Not on file    Non-medical: Not on file  Tobacco Use  . Smoking status: Never Smoker  . Smokeless tobacco: Never Used  Substance and Sexual Activity  . Alcohol use: No  . Drug use: No  . Sexual activity: Not on file  Lifestyle  . Physical activity    Days per week: Not on file    Minutes per session: Not on file  . Stress: Not on file  Relationships  . Social Herbalist on phone: Not on file    Gets together: Not on file    Attends religious service: Not on file     Active member of club or organization: Not on file    Attends meetings of clubs or organizations: Not on file    Relationship status: Not on file  . Intimate partner violence    Fear of current or ex partner: Not on file    Emotionally abused: Not on file    Physically abused: Not on file    Forced sexual activity: Not on file  Other Topics Concern  . Not on file  Social History Narrative  . Not on file     Allergies  Allergen Reactions  . Latex Rash    Other reaction(s): Chest Pain  . Lisinopril Swelling    Angioedema   . Lasix [Furosemide] Rash and Other (See Comments)    Shortness of breath  . Prednisone Rash  . Sulfur Rash     Outpatient Medications Prior to Visit  Medication Sig Dispense Refill  . aspirin EC 81 MG tablet Take 1 tablet (81 mg total) by mouth 2 (two) times daily. 42 tablet 0  . b complex vitamins capsule Take 1 capsule by mouth daily.    . Coenzyme Q10 (CO Q 10 PO) Take by mouth.    . diltiazem (CARDIZEM CD) 300 MG 24 hr capsule Take 1 capsule (300 mg total) by mouth daily. 30 capsule 2  . fish oil-omega-3 fatty acids 1000 MG capsule Take 2 g by mouth daily.    . Flaxseed, Linseed, 1000 MG CAPS Take by mouth.    . furosemide (LASIX) 40 MG tablet Take 1 tablet (40 mg total) by mouth daily. 90 tablet 1  . ibuprofen (ADVIL) 800 MG tablet Take 1 tablet (800 mg total) by mouth every 8 (eight) hours as needed (pain). 30 tablet 0  . Melatonin 5 MG CAPS Take by mouth as needed.     . Multiple Vitamins-Minerals (MEGA MULTIVITAMIN FOR WOMEN) TABS Take by mouth.    . potassium chloride (K-DUR) 10 MEQ tablet Take 1 tablet (10 mEq total) by mouth daily. 90 tablet 1  . Pyridoxine HCl (VITAMIN B-6) 500 MG tablet Take 500 mg by mouth daily.    Marland Kitchen spironolactone (ALDACTONE) 25 MG tablet Take 0.5 tablets (12.5 mg total) by mouth daily. 30 tablet 1  . telmisartan-hydrochlorothiazide (MICARDIS HCT) 80-25 MG tablet Take 1 tablet by mouth daily. 90 tablet 1  . VITAMIN A OP  Apply to eye.    . vitamin B-12 (CYANOCOBALAMIN) 1000 MCG tablet Take 1,000 mcg by mouth daily.    . vitamin C (ASCORBIC ACID) 500 MG tablet Take 500 mg by mouth daily.    Marland Kitchen  VITAMIN D, CHOLECALCIFEROL, PO Take by mouth.    . doxycycline (VIBRAMYCIN) 100 MG capsule Take 1 capsule (100 mg total) by mouth 2 (two) times daily. (Patient not taking: Reported on 09/02/2019) 28 capsule 0   No facility-administered medications prior to visit.     Review of Systems  Constitutional: Positive for malaise/fatigue. Negative for chills, fever and weight loss.  HENT: Negative for hearing loss, sore throat and tinnitus.   Eyes: Negative for blurred vision and double vision.  Respiratory: Positive for shortness of breath. Negative for cough, hemoptysis, sputum production, wheezing and stridor.   Cardiovascular: Negative for chest pain, palpitations, orthopnea, leg swelling and PND.  Gastrointestinal: Negative for abdominal pain, constipation, diarrhea, heartburn, nausea and vomiting.  Genitourinary: Negative for dysuria, hematuria and urgency.  Musculoskeletal: Positive for joint pain, myalgias and neck pain.  Skin: Negative for itching and rash.  Neurological: Positive for tingling. Negative for dizziness, weakness and headaches.  Endo/Heme/Allergies: Negative for environmental allergies. Does not bruise/bleed easily.  Psychiatric/Behavioral: Negative for depression. The patient is nervous/anxious. The patient does not have insomnia.   All other systems reviewed and are negative.    Objective:  Physical Exam Vitals signs reviewed.  Constitutional:      General: She is not in acute distress.    Appearance: She is well-developed. She is obese.  HENT:     Head: Normocephalic and atraumatic.  Eyes:     General: No scleral icterus.    Conjunctiva/sclera: Conjunctivae normal.     Pupils: Pupils are equal, round, and reactive to light.  Neck:     Musculoskeletal: Neck supple.     Vascular: No JVD.      Trachea: No tracheal deviation.  Cardiovascular:     Rate and Rhythm: Normal rate and regular rhythm.     Heart sounds: Normal heart sounds. No murmur.  Pulmonary:     Effort: Pulmonary effort is normal. No tachypnea, accessory muscle usage or respiratory distress.     Breath sounds: Normal breath sounds. No stridor. No wheezing, rhonchi or rales.  Abdominal:     General: There is no distension.     Palpations: Abdomen is soft.     Tenderness: There is no abdominal tenderness.  Musculoskeletal:        General: No tenderness.  Lymphadenopathy:     Cervical: No cervical adenopathy.  Skin:    General: Skin is warm and dry.     Capillary Refill: Capillary refill takes less than 2 seconds.     Findings: No rash.  Neurological:     Mental Status: She is alert and oriented to person, place, and time.  Psychiatric:        Behavior: Behavior normal.      Vitals:   09/02/19 0955  BP: 132/78  Pulse: 64  Temp: 98.4 F (36.9 C)  TempSrc: Temporal  SpO2: 99%  Weight: 234 lb 12.8 oz (106.5 kg)  Height: 5\' 4"  (1.626 m)   99% on RA BMI Readings from Last 3 Encounters:  09/02/19 40.30 kg/m  08/20/19 40.68 kg/m  08/14/19 40.68 kg/m   Wt Readings from Last 3 Encounters:  09/02/19 234 lb 12.8 oz (106.5 kg)  08/20/19 237 lb (107.5 kg)  08/14/19 237 lb (107.5 kg)     CBC    Component Value Date/Time   WBC 6.3 07/30/2019 1601   RBC 4.09 07/30/2019 1601   HGB 11.5 (L) 07/30/2019 1601   HGB 11.9 05/12/2019 1126   HCT 35.3 (L) 07/30/2019  1601   HCT 36.5 05/12/2019 1126   PLT 371.0 07/30/2019 1601   PLT 417 05/12/2019 1126   MCV 86.3 07/30/2019 1601   MCV 85 05/12/2019 1126   MCH 26.7 05/21/2019 1335   MCHC 32.5 07/30/2019 1601   RDW 16.8 (H) 07/30/2019 1601   RDW 14.1 05/12/2019 1126   LYMPHSABS 2.3 05/21/2019 1335   MONOABS 0.6 05/21/2019 1335   EOSABS 0.3 05/21/2019 1335   BASOSABS 0.0 05/21/2019 1335     Chest Imaging: CT 2018: no nodules The patient's images  have been independently reviewed by me.    Pulmonary Functions Testing Results: No flowsheet data found.  FeNO: none   Pathology: none   Echocardiogram:   12/11/2018  1. The left ventricle has normal systolic function with an ejection fraction of 60-65%. The cavity size was normal. Left ventricular diastolic Doppler parameters are consistent with impaired relaxation.  2. The right ventricle has normal systolic function. The cavity was normal. There is no increase in right ventricular wall thickness.  3. Left atrial size was mildly dilated.  4. The mitral valve is normal in structure.  5. The tricuspid valve is normal in structure.  6. The aortic valve is normal in structure.  7. The pulmonic valve was normal in structure.  Heart Catheterization: None   Renal artery: No renal artery stenosis    Assessment & Plan:      ICD-10-CM   1. Physical deconditioning  R53.81   2. Obese body habitus  E66.9   3. Sleep disturbance  G47.9   4. DOE (dyspnea on exertion)  R06.00     Discussion:  This is a 57 year old female lifelong non-smoker.  Has had a much more sedentary lifestyle since March.  Also had weight gain.  Feels like she is much more physically deconditioned than what she used to be.  She also has significant sleep disturbance.  Unable to fall asleep waking up in the melanite gasping for breath.  Plan: She needs to be seen by one of our providers in the sleep clinic. I called and spoke to Dr. Ander Slade She already has a split night study that has been ordered by cardiology.  She needs to get this scheduled. We also talked about the importance of weight loss and getting back to her physical activity level. Recommended her starting off with small goals that she could achieve each day.  She is can keep a log of these to help boost morale. She is also recovered nicely from her ankle fracture and is slowly working the stiffness out of this unable to walk a little bit more.   Patient to return to clinic to see someone in sleep clinic following completion of her sleep study.  Greater than 50% of this patient's 30-minute office was in face-to-face discussing above recommendations and treatment plan.   Current Outpatient Medications:  .  aspirin EC 81 MG tablet, Take 1 tablet (81 mg total) by mouth 2 (two) times daily., Disp: 42 tablet, Rfl: 0 .  b complex vitamins capsule, Take 1 capsule by mouth daily., Disp: , Rfl:  .  Coenzyme Q10 (CO Q 10 PO), Take by mouth., Disp: , Rfl:  .  diltiazem (CARDIZEM CD) 300 MG 24 hr capsule, Take 1 capsule (300 mg total) by mouth daily., Disp: 30 capsule, Rfl: 2 .  fish oil-omega-3 fatty acids 1000 MG capsule, Take 2 g by mouth daily., Disp: , Rfl:  .  Flaxseed, Linseed, 1000 MG CAPS, Take by mouth.,  Disp: , Rfl:  .  furosemide (LASIX) 40 MG tablet, Take 1 tablet (40 mg total) by mouth daily., Disp: 90 tablet, Rfl: 1 .  ibuprofen (ADVIL) 800 MG tablet, Take 1 tablet (800 mg total) by mouth every 8 (eight) hours as needed (pain)., Disp: 30 tablet, Rfl: 0 .  Melatonin 5 MG CAPS, Take by mouth as needed. , Disp: , Rfl:  .  Multiple Vitamins-Minerals (MEGA MULTIVITAMIN FOR WOMEN) TABS, Take by mouth., Disp: , Rfl:  .  potassium chloride (K-DUR) 10 MEQ tablet, Take 1 tablet (10 mEq total) by mouth daily., Disp: 90 tablet, Rfl: 1 .  Pyridoxine HCl (VITAMIN B-6) 500 MG tablet, Take 500 mg by mouth daily., Disp: , Rfl:  .  spironolactone (ALDACTONE) 25 MG tablet, Take 0.5 tablets (12.5 mg total) by mouth daily., Disp: 30 tablet, Rfl: 1 .  telmisartan-hydrochlorothiazide (MICARDIS HCT) 80-25 MG tablet, Take 1 tablet by mouth daily., Disp: 90 tablet, Rfl: 1 .  VITAMIN A OP, Apply to eye., Disp: , Rfl:  .  vitamin B-12 (CYANOCOBALAMIN) 1000 MCG tablet, Take 1,000 mcg by mouth daily., Disp: , Rfl:  .  vitamin C (ASCORBIC ACID) 500 MG tablet, Take 500 mg by mouth daily., Disp: , Rfl:  .  VITAMIN D, CHOLECALCIFEROL, PO, Take by mouth., Disp: ,  Rfl:    Garner Nash, DO [facility] 09/02/2019 1:39 PM

## 2019-09-07 ENCOUNTER — Ambulatory Visit: Payer: 59 | Admitting: Diagnostic Neuroimaging

## 2019-09-08 ENCOUNTER — Encounter: Payer: Self-pay | Admitting: Diagnostic Neuroimaging

## 2019-09-11 ENCOUNTER — Ambulatory Visit: Payer: 59 | Admitting: Cardiology

## 2019-09-11 MED FILL — SAXENDA 18 MG/3 ML PEN: 18 | 30 days supply | Qty: 15 | Fill #1

## 2019-09-16 ENCOUNTER — Other Ambulatory Visit (HOSPITAL_COMMUNITY)
Admission: RE | Admit: 2019-09-16 | Discharge: 2019-09-16 | Disposition: A | Discharge status: Home / Self Care | Payer: 59 | Source: Ambulatory Visit | Attending: Cardiovascular Disease | Admitting: Cardiovascular Disease

## 2019-09-16 ENCOUNTER — Ambulatory Visit: Payer: 59 | Admitting: Family

## 2019-09-16 DIAGNOSIS — Z01812 Encounter for preprocedural laboratory examination: Secondary | ICD-10-CM | POA: Diagnosis present

## 2019-09-16 DIAGNOSIS — Z20828 Contact with and (suspected) exposure to other viral communicable diseases: Secondary | ICD-10-CM | POA: Insufficient documentation

## 2019-09-16 LAB — SARS CORONAVIRUS 2 (TAT 6-24 HRS): SARS Coronavirus 2: NEGATIVE

## 2019-09-16 MED FILL — IBUPROFEN 800 MG TAB: 800 | 10 days supply | Qty: 30 | Fill #0

## 2019-09-17 ENCOUNTER — Ambulatory Visit: Payer: 59 | Admitting: Family

## 2019-09-17 NOTE — Telephone Encounter (Signed)
Phoned patient, confirmed that she has had COVID testing and knows that her sleep study is scheduled for 09/19/19 at 8 pm and aware of location and instructions. No further questions.

## 2019-09-18 ENCOUNTER — Telehealth: Payer: Self-pay | Admitting: *Deleted

## 2019-09-18 ENCOUNTER — Other Ambulatory Visit: Payer: Self-pay | Admitting: Cardiovascular Disease

## 2019-09-18 DIAGNOSIS — G479 Sleep disorder, unspecified: Secondary | ICD-10-CM

## 2019-09-18 DIAGNOSIS — I10 Essential (primary) hypertension: Secondary | ICD-10-CM

## 2019-09-18 NOTE — Telephone Encounter (Signed)
Left message to return a call to me today in reference to sleep study appointment.

## 2019-09-19 ENCOUNTER — Ambulatory Visit (HOSPITAL_BASED_OUTPATIENT_CLINIC_OR_DEPARTMENT_OTHER): Payer: 59 | Admitting: Cardiovascular Disease

## 2019-09-19 ENCOUNTER — Other Ambulatory Visit (HOSPITAL_BASED_OUTPATIENT_CLINIC_OR_DEPARTMENT_OTHER): Payer: Self-pay

## 2019-09-20 NOTE — Progress Notes (Deleted)
Cardiology Office Note:    Date:  09/20/2019   ID:  Destiny Henderson, DOB [birth year], MRN [id number]  PCP:  Shelda Pal, DO  Cardiologist:  Shirlee More, MD    Referring MD: Shelda Pal*    ASSESSMENT:    No diagnosis found. PLAN:    In order of problems listed above:  1. ***   Next appointment: ***   Medication Adjustments/Labs and Tests Ordered: Current medicines are reviewed at length with the patient today.  Concerns regarding medicines are outlined above.  No orders of the defined types were placed in this encounter.  No orders of the defined types were placed in this encounter.   No chief complaint on file.   History of Present Illness:    Destiny Henderson NEETI KNUDTSON is a 58 y.o. female with a hx of HTN, HLD, obesity, removal of benign tumor from heart in the 90s  last seen 08/07/2019 for uncontrolled hypertension. Compliance with diet, lifestyle and medications: ***  Echo 12/11/18 1. The left ventricle has normal systolic function with an ejection fraction of 60-65%. The cavity size was normal. Left ventricular diastolic Doppler parameters are consistent with impaired relaxation. 2. The right ventricle has normal systolic function. The cavity was normal. There is no increase in right ventricular wall thickness. 3. Left atrial size was mildly dilated. 4. The mitral valve is normal in structure. 5. The tricuspid valve is normal in structure. 6. The aortic valve is normal in structure. 7. The pulmonic valve was normal in structure.  Renal artery duplex 08/14/2019: Right: Normal size right kidney. No evidence of right renal artery stenosis. Left:    Normal size of left kidney. No evidence of left renal artery stenosis.  Past Medical History:  Diagnosis Date  . Anemia   . Benign tumor of bladder   . DVT (deep venous thrombosis) ([place])   . Essential hypertension, benign 03/03/2013  . Fibroma of heart   . Obesity 03/03/2013     Past Surgical History:  Procedure Laterality Date  . BACK SURGERY    . bladder tack    . DILATION AND CURETTAGE OF UTERUS     after SAB  . HEART TUMOR EXCISION      Current Medications: No outpatient medications have been marked as taking for the 09/21/19 encounter (Appointment) with Richardo Priest, MD.     Allergies:   Latex, Lisinopril, Lasix [furosemide], Prednisone, and Sulfur   Social History   Socioeconomic History  . Marital status: Married    Spouse name: Not on file  . Number of children: Not on file  . Years of education: Not on file  . Highest education level: Not on file  Occupational History  . Occupation: Network engineer  Tobacco Use  . Smoking status: Never Smoker  . Smokeless tobacco: Never Used  Substance and Sexual Activity  . Alcohol use: No  . Drug use: No  . Sexual activity: Not on file  Other Topics Concern  . Not on file  Social History Narrative  . Not on file   Social Determinants of Health   Financial Resource Strain:   . Difficulty of Paying Living Expenses: Not on file  Food Insecurity:   . Worried About Charity fundraiser in the Last Year: Not on file  . Ran Out of Food in the Last Year: Not on file  Transportation Needs:   . Lack of Transportation (Medical): Not on file  . Lack of Transportation (Non-Medical):  Not on file  Physical Activity:   . Days of Exercise per Week: Not on file  . Minutes of Exercise per Session: Not on file  Stress:   . Feeling of Stress : Not on file  Social Connections:   . Frequency of Communication with Friends and Family: Not on file  . Frequency of Social Gatherings with Friends and Family: Not on file  . Attends Religious Services: Not on file  . Active Member of Clubs or Organizations: Not on file  . Attends Archivist Meetings: Not on file  . Marital Status: Not on file     Family History: The patient's ***family history includes Breast cancer in her mother; Cancer in her father,  maternal aunt, maternal grandfather, and another family member; Colon cancer in her father; Diabetes in her maternal uncle; Heart failure in her maternal aunt and maternal grandmother; Hyperlipidemia in her mother; Hypertension in her maternal aunt, maternal aunt, maternal uncle, and mother; Thyroid disease in her sister and sister. ROS:   Please see the history of present illness.    All other systems reviewed and are negative.  EKGs/Labs/Other Studies Reviewed:    The following studies were reviewed today:  EKG:  EKG ordered today and personally reviewed.  The ekg ordered today demonstrates ***  Recent Labs: 07/30/2019: Hemoglobin 11.5; Platelets 371.0; Pro B Natriuretic peptide (BNP) 28.0; TSH 1.25 08/07/2019: ALT 12; BUN 12; Creatinine, Ser 1.00; Potassium 3.8; Sodium 145  Recent Lipid Panel    Component Value Date/Time   CHOL 201 (H) 08/07/2019 1422   TRIG 112.0 08/07/2019 1422   HDL 45.20 08/07/2019 1422   CHOLHDL 4 08/07/2019 1422   VLDL 22.4 08/07/2019 1422   LDLCALC 133 (H) 08/07/2019 1422    Physical Exam:    VS:  There were no vitals taken for this visit.    Wt Readings from Last 3 Encounters:  09/02/19 234 lb 12.8 oz (106.5 kg)  08/20/19 237 lb (107.5 kg)  08/14/19 237 lb (107.5 kg)     GEN: *** Well nourished, well developed in no acute distress HEENT: Normal NECK: No JVD; No carotid bruits LYMPHATICS: No lymphadenopathy CARDIAC: ***RRR, no murmurs, rubs, gallops RESPIRATORY:  Clear to auscultation without rales, wheezing or rhonchi  ABDOMEN: Soft, non-tender, non-distended MUSCULOSKELETAL:  No edema; No deformity  SKIN: Warm and dry NEUROLOGIC:  Alert and oriented x 3 PSYCHIATRIC:  Normal affect    Signed, Shirlee More, MD  09/20/2019 5:18 PM    [facility]

## 2019-09-21 ENCOUNTER — Ambulatory Visit: Payer: 59 | Admitting: Cardiology

## 2019-09-24 ENCOUNTER — Ambulatory Visit: Payer: 59 | Admitting: Pulmonary Disease

## 2019-09-25 ENCOUNTER — Telehealth: Payer: Self-pay | Admitting: Cardiology

## 2019-09-25 NOTE — Telephone Encounter (Signed)
Patient called demanding to be seen by Dr Agustin Cree today due to her BP being 192/114. She also states she has a headache and blood shot eyes.  I explained we were completely booked today.  She asked to be seen by another doctor and I explained the other providers were booked today as well.  She then insisted on a 5 minute virtual appt with Dr Agustin Cree and said she could not understand why he did not have 5 minutes for her.  She stated she wanted her records because she was going to another cardiologist since we could not get in her for 5 minutes.  She does however have an appt 10/07/2019 that she said she wanted to keep.  We ended the call with the patient demanding a return call asap.

## 2019-09-25 NOTE — Telephone Encounter (Signed)
Left message for patient to return call.

## 2019-09-29 NOTE — Telephone Encounter (Signed)
Left message for patient to return call.

## 2019-10-06 ENCOUNTER — Ambulatory Visit: Payer: 59 | Admitting: Cardiology

## 2019-10-07 ENCOUNTER — Telehealth: Payer: Self-pay | Admitting: Cardiology

## 2019-10-07 ENCOUNTER — Ambulatory Visit: Payer: 59 | Admitting: Cardiology

## 2019-10-07 NOTE — Telephone Encounter (Signed)
  Pt c/o BP issue: STAT if pt c/o blurred vision, one-sided weakness or slurred speech  1. What are your last 5 BP readings? 190/101, 188/100, 175/99, 192/114  2. Are you having any other symptoms (ex. Dizziness, headache, blurred vision, passed out)? Dizzy, headache, some blurred vision  3. What is your BP issue? BP is high and her ankles are swollen.   Patient states her appointment on 12/30 was supposed to be virtual. She is not feeling well so she rescheduled for 1/28, but would like to see if she can get a sooner virtual appointment.

## 2019-10-07 NOTE — Telephone Encounter (Signed)
Spoke to patient, she had a appointment for today with Dr. Agustin Cree but cancelled because she wanted it to be virtual. She then asks if I can add her on to his schedule tomorrow for a virtual appointment. I did this. I informed Dr. Agustin Cree of her complaints, per him patient was advised to see [name] tomorrow or go to the emergency department and be seen if she feels worse. She verbally understood. No further questions.

## 2019-10-08 ENCOUNTER — Other Ambulatory Visit: Payer: Self-pay

## 2019-10-08 ENCOUNTER — Telehealth (INDEPENDENT_AMBULATORY_CARE_PROVIDER_SITE_OTHER): Payer: 59 | Admitting: Cardiology

## 2019-10-08 ENCOUNTER — Encounter: Payer: Self-pay | Admitting: Cardiology

## 2019-10-08 VITALS — BP 188/101 | HR 79 | Ht 64.0 in | Wt 218.0 lb

## 2019-10-08 DIAGNOSIS — I10 Essential (primary) hypertension: Secondary | ICD-10-CM

## 2019-10-08 DIAGNOSIS — R0609 Other forms of dyspnea: Secondary | ICD-10-CM

## 2019-10-08 DIAGNOSIS — R600 Localized edema: Secondary | ICD-10-CM

## 2019-10-08 DIAGNOSIS — E782 Mixed hyperlipidemia: Secondary | ICD-10-CM

## 2019-10-08 NOTE — Progress Notes (Signed)
Virtual Visit via Telephone Note   This visit type was conducted due to national recommendations for restrictions regarding the COVID-19 Pandemic (e.g. social distancing) in an effort to limit this patient's exposure and mitigate transmission in our community.  Due to her co-morbid illnesses, this patient is at least at moderate risk for complications without adequate follow up.  This format is felt to be most appropriate for this patient at this time.  The patient did not have access to video technology/had technical difficulties with video requiring transitioning to audio format only (telephone).  All issues noted in this document were discussed and addressed.  No physical exam could be performed with this format.  Please refer to the patient's chart for her  consent to telehealth for [facility]  Evaluation Performed:  Follow-up visit  This visit type was conducted due to national recommendations for restrictions regarding the COVID-19 Pandemic (e.g. social distancing).  This format is felt to be most appropriate for this patient at this time.  All issues noted in this document were discussed and addressed.  No physical exam was performed (except for noted visual exam findings with Video Visits).  Please refer to the patient's chart (MyChart message for video visits and phone note for telephone visits) for the patient's consent to telehealth for [facility].  Date:  10/08/2019  ID: Jorita Bohanon, DOB 11-18-1960, MRN [id number]   Patient Location: [place] [address]   Provider location:   [place] [facility]  PCP:  Shelda Pal, DO  Cardiologist:  Jenne Campus, MD     Chief Complaint: I am still having difficulty with my blood pressure  History of Present Illness:    Destiny Henderson is a 58 y.o. female  who presents via audio/video conferencing for a telehealth visit today.  With blood pressure which seems to be  difficult to control.  There was also some emotional complement into it.  Also shortness of breath as well as remote history of DVT.  She does have virtual visit with me today.  She was unable to establish video link, therefore we have only telephone conversation.  She sounds good actually and she said she is fine but she reports to have high blood pressure in the morning typically 180/110.  On more questioning I realized that she takes medications at 10:00 in the morning and she check blood pressure before that.  I told her that she need to start taking medication earlier I told her that probably first thing to change to do a days to take her medications.  She will change her habits.  Also asked her to check her blood pressure no more than twice a day different days different time of the day and bring results to me in about 2 to 3 weeks.  We did talk about need to exercise on a regular basis which she does.  After me she sounds good today   The patient does not have symptoms concerning for COVID-19 infection (fever, chills, cough, or new SHORTNESS OF BREATH).    Prior CV studies:   The following studies were reviewed today:       Past Medical History:  Diagnosis Date  . Anemia   . Benign tumor of bladder   . DVT (deep venous thrombosis) ([place])   . Essential hypertension, benign 03/03/2013  . Fibroma of heart   . Obesity 03/03/2013    Past Surgical History:  Procedure Laterality  Date  . BACK SURGERY    . bladder tack    . DILATION AND CURETTAGE OF UTERUS     after SAB  . HEART TUMOR EXCISION       Current Meds  Medication Sig  . aspirin EC 81 MG tablet Take 1 tablet (81 mg total) by mouth 2 (two) times daily.  Marland Kitchen b complex vitamins capsule Take 1 capsule by mouth daily.  . Coenzyme Q10 (CO Q 10 PO) Take by mouth.  . diltiazem (CARDIZEM CD) 300 MG 24 hr capsule Take 1 capsule (300 mg total) by mouth daily.  . fish oil-omega-3 fatty acids 1000 MG capsule Take 2 g by mouth daily.  .  Flaxseed, Linseed, 1000 MG CAPS Take by mouth.  . furosemide (LASIX) 40 MG tablet Take 1 tablet (40 mg total) by mouth daily.  Marland Kitchen ibuprofen (ADVIL) 800 MG tablet Take 1 tablet (800 mg total) by mouth every 8 (eight) hours as needed (pain).  . Melatonin 5 MG CAPS Take by mouth as needed.   . Multiple Vitamins-Minerals (MEGA MULTIVITAMIN FOR WOMEN) TABS Take by mouth.  . potassium chloride (K-DUR) 10 MEQ tablet Take 1 tablet (10 mEq total) by mouth daily.  . Pyridoxine HCl (VITAMIN B-6) 500 MG tablet Take 500 mg by mouth daily.  Marland Kitchen spironolactone (ALDACTONE) 25 MG tablet Take 0.5 tablets (12.5 mg total) by mouth daily.  Marland Kitchen telmisartan-hydrochlorothiazide (MICARDIS HCT) 80-25 MG tablet Take 1 tablet by mouth daily.  Marland Kitchen VITAMIN A OP Apply to eye.  . vitamin B-12 (CYANOCOBALAMIN) 1000 MCG tablet Take 1,000 mcg by mouth daily.  . vitamin C (ASCORBIC ACID) 500 MG tablet Take 500 mg by mouth daily.  Marland Kitchen VITAMIN D, CHOLECALCIFEROL, PO Take by mouth.      Family History: The patient's family history includes Breast cancer in her mother; Cancer in her father, maternal aunt, maternal grandfather, and another family member; Colon cancer in her father; Diabetes in her maternal uncle; Heart failure in her maternal aunt and maternal grandmother; Hyperlipidemia in her mother; Hypertension in her maternal aunt, maternal aunt, maternal uncle, and mother; Thyroid disease in her sister and sister.   ROS:   Please see the history of present illness.     All other systems reviewed and are negative.   Labs/Other Tests and Data Reviewed:     Recent Labs: 07/30/2019: Hemoglobin 11.5; Platelets 371.0; Pro B Natriuretic peptide (BNP) 28.0; TSH 1.25 08/07/2019: ALT 12; BUN 12; Creatinine, Ser 1.00; Potassium 3.8; Sodium 145  Recent Lipid Panel    Component Value Date/Time   CHOL 201 (H) 08/07/2019 1422   TRIG 112.0 08/07/2019 1422   HDL 45.20 08/07/2019 1422   CHOLHDL 4 08/07/2019 1422   VLDL 22.4 08/07/2019  1422   LDLCALC 133 (H) 08/07/2019 1422      Exam:    Vital Signs:  BP (!) 188/101   Pulse 79   Ht 5\' 4"  (1.626 m)   Wt 218 lb (98.9 kg)   BMI 37.42 kg/m     Wt Readings from Last 3 Encounters:  10/08/19 218 lb (98.9 kg)  09/02/19 234 lb 12.8 oz (106.5 kg)  08/20/19 237 lb (107.5 kg)     Well nourished, well developed in no acute distress. Alert awake and attentive 3 quite cheerful over the phone.  Not in distress she is at home I am in our office in [facility]  Diagnosis for this visit:   1. Essential hypertension, benign   2. Mixed  hyperlipidemia   3. Pedal edema      ASSESSMENT & PLAN:    1.  Benign essential hypertension plan as outlined above.  She will change the schedule of her medication she will take first medication first thing in the morning.  She will check blood pressure no more than twice a day and reports to me. Mixed dyslipidemia we will make arrangements for fasting lipid profile. 3.  Pedal edema denies having any.  Overall is quite complicated situation.  I did review note from pulmonologist.  She was advised to exercise on the regular basis and lose some weight which I think is an excellent idea.  However I will also schedule her to have an echocardiogram to reassess left ventricle ejection fraction especially I would like to check for any evidence of left ventricle hypertrophy.  COVID-19 Education: The signs and symptoms of COVID-19 were discussed with the patient and how to seek care for testing (follow up with PCP or arrange E-visit).  The importance of social distancing was discussed today.  Patient Risk:   After full review of this patients clinical status, I feel that they are at least moderate risk at this time.  Time:   Today, I have spent 5 minutes with the patient with telehealth technology discussing pt health issues.  I spent 15 minutes reviewing her chart before the visit.  Visit was finished at 9:44 AM.    Medication Adjustments/Labs  and Tests Ordered: Current medicines are reviewed at length with the patient today.  Concerns regarding medicines are outlined above.  No orders of the defined types were placed in this encounter.  Medication changes: No orders of the defined types were placed in this encounter.    Disposition: Follow-up 1 month  Signed, Park Liter, MD, [facility] 10/08/2019 9:42 AM    [place]

## 2019-10-08 NOTE — Patient Instructions (Signed)
Medication Instructions:  Your physician recommends that you continue on your current medications as directed. Please refer to the Current Medication list given to you today.  *If you need a refill on your cardiac medications before your next appointment, please call your pharmacy*  Lab Work: None ordered  If you have labs (blood work) drawn today and your tests are completely normal, you will receive your results only by: Marland Kitchen MyChart Message (if you have MyChart) OR . A paper copy in the mail If you have any lab test that is abnormal or we need to change your treatment, we will call you to review the results.  Testing/Procedures: Your physician has requested that you have an echocardiogram. Echocardiography is a painless test that uses sound waves to create images of your heart. It provides your doctor with information about the size and shape of your heart and how well your heart's chambers and valves are working. This procedure takes approximately one hour. There are no restrictions for this procedure.    Follow-Up: At [facility], you and your health needs are our priority. ([place])   As part of our continuing mission to provide you with exceptional heart care, we have created designated Provider Care Teams.  These Care Teams include your primary Cardiologist (physician) and Advanced Practice Providers (APPs -  Physician Assistants and Nurse Practitioners) who all work together to provide you with the care you need, when you need it.  Your next appointment:   1 month(s)  The format for your next appointment:   Virtual Visit   Provider:   Jenne Campus, MD  Other Instructions  Echocardiogram An echocardiogram is a procedure that uses painless sound waves (ultrasound) to produce an image of the heart. Images from an echocardiogram can provide important information about:  Signs of coronary artery disease (CAD).  Aneurysm detection. An aneurysm is a  weak or damaged part of an artery wall that bulges out from the normal force of blood pumping through the body.  Heart size and shape. Changes in the size or shape of the heart can be associated with certain conditions, including heart failure, aneurysm, and CAD.  Heart muscle function.  Heart valve function.  Signs of a past heart attack.  Fluid buildup around the heart.  Thickening of the heart muscle.  A tumor or infectious growth around the heart valves. Tell a health care provider about:  Any allergies you have.  All medicines you are taking, including vitamins, herbs, eye drops, creams, and over-the-counter medicines.  Any blood disorders you have.  Any surgeries you have had.  Any medical conditions you have.  Whether you are pregnant or may be pregnant. What are the risks? Generally, this is a safe procedure. However, problems may occur, including:  Allergic reaction to dye (contrast) that may be used during the procedure. What happens before the procedure? No specific preparation is needed. You may eat and drink normally. What happens during the procedure?   An IV tube may be inserted into one of your veins.  You may receive contrast through this tube. A contrast is an injection that improves the quality of the pictures from your heart.  A gel will be applied to your chest.  A wand-like tool (transducer) will be moved over your chest. The gel will help to transmit the sound waves from the transducer.  The sound waves will harmlessly bounce off of your heart to allow the heart images to be  captured in real-time motion. The images will be recorded on a computer. The procedure may vary among health care providers and hospitals. What happens after the procedure?  You may return to your normal, everyday life, including diet, activities, and medicines, unless your health care provider tells you not to do that. Summary  An echocardiogram is a procedure that uses  painless sound waves (ultrasound) to produce an image of the heart.  Images from an echocardiogram can provide important information about the size and shape of your heart, heart muscle function, heart valve function, and fluid buildup around your heart.  You do not need to do anything to prepare before this procedure. You may eat and drink normally.  After the echocardiogram is completed, you may return to your normal, everyday life, unless your health care provider tells you not to do that. This information is not intended to replace advice given to you by your health care provider. Make sure you discuss any questions you have with your health care provider. Document Revised: 01/15/2019 Document Reviewed: 10/27/2016 Elsevier Patient Education  [place].

## 2019-10-08 NOTE — Telephone Encounter (Signed)
Patient had visit today.

## 2019-10-15 ENCOUNTER — Ambulatory Visit (HOSPITAL_BASED_OUTPATIENT_CLINIC_OR_DEPARTMENT_OTHER): Admission: RE | Admit: 2019-10-15 | Payer: PRIVATE HEALTH INSURANCE | Source: Ambulatory Visit

## 2019-10-16 ENCOUNTER — Other Ambulatory Visit (HOSPITAL_BASED_OUTPATIENT_CLINIC_OR_DEPARTMENT_OTHER): Payer: PRIVATE HEALTH INSURANCE

## 2019-10-20 ENCOUNTER — Other Ambulatory Visit: Payer: Self-pay

## 2019-10-22 ENCOUNTER — Other Ambulatory Visit: Payer: Self-pay

## 2019-10-22 ENCOUNTER — Ambulatory Visit (INDEPENDENT_AMBULATORY_CARE_PROVIDER_SITE_OTHER): Payer: PRIVATE HEALTH INSURANCE | Admitting: Internal Medicine

## 2019-10-22 DIAGNOSIS — R5381 Other malaise: Secondary | ICD-10-CM

## 2019-10-22 DIAGNOSIS — G479 Sleep disorder, unspecified: Secondary | ICD-10-CM

## 2019-10-22 NOTE — Progress Notes (Signed)
10/22/19- Virtual Visit via Telephone Note  Error- Patient was No Show for televisit

## 2019-10-23 ENCOUNTER — Encounter: Payer: Self-pay | Admitting: Family Medicine

## 2019-10-23 ENCOUNTER — Other Ambulatory Visit (HOSPITAL_BASED_OUTPATIENT_CLINIC_OR_DEPARTMENT_OTHER): Payer: PRIVATE HEALTH INSURANCE

## 2019-10-23 ENCOUNTER — Other Ambulatory Visit: Payer: Self-pay

## 2019-10-23 ENCOUNTER — Ambulatory Visit (INDEPENDENT_AMBULATORY_CARE_PROVIDER_SITE_OTHER): Payer: PRIVATE HEALTH INSURANCE | Admitting: Internal Medicine

## 2019-10-23 ENCOUNTER — Ambulatory Visit: Payer: PRIVATE HEALTH INSURANCE | Admitting: Family Medicine

## 2019-10-23 ENCOUNTER — Encounter: Payer: Self-pay | Admitting: Internal Medicine

## 2019-10-23 ENCOUNTER — Ambulatory Visit (HOSPITAL_BASED_OUTPATIENT_CLINIC_OR_DEPARTMENT_OTHER)
Admission: RE | Admit: 2019-10-23 | Discharge: 2019-10-23 | Disposition: A | Discharge status: Home / Self Care | Payer: PRIVATE HEALTH INSURANCE | Source: Ambulatory Visit | Attending: Cardiology | Admitting: Cardiology

## 2019-10-23 VITALS — BP 138/88 | HR 87 | Temp 96.1°F | Ht 64.0 in | Wt 242.0 lb

## 2019-10-23 DIAGNOSIS — R0600 Dyspnea, unspecified: Secondary | ICD-10-CM | POA: Diagnosis present

## 2019-10-23 DIAGNOSIS — R300 Dysuria: Secondary | ICD-10-CM | POA: Diagnosis not present

## 2019-10-23 DIAGNOSIS — M329 Systemic lupus erythematosus, unspecified: Secondary | ICD-10-CM

## 2019-10-23 DIAGNOSIS — G43809 Other migraine, not intractable, without status migrainosus: Secondary | ICD-10-CM

## 2019-10-23 DIAGNOSIS — M069 Rheumatoid arthritis, unspecified: Secondary | ICD-10-CM

## 2019-10-23 DIAGNOSIS — E559 Vitamin D deficiency, unspecified: Secondary | ICD-10-CM

## 2019-10-23 DIAGNOSIS — E010 Iodine-deficiency related diffuse (endemic) goiter: Secondary | ICD-10-CM

## 2019-10-23 DIAGNOSIS — R0609 Other forms of dyspnea: Secondary | ICD-10-CM

## 2019-10-23 DIAGNOSIS — R5383 Other fatigue: Secondary | ICD-10-CM

## 2019-10-23 HISTORY — DX: Other fatigue: R53.83

## 2019-10-23 HISTORY — DX: Systemic lupus erythematosus, unspecified: M32.9

## 2019-10-23 HISTORY — DX: Rheumatoid arthritis, unspecified: M06.9

## 2019-10-23 LAB — VITAMIN D 25 HYDROXY (VIT D DEFICIENCY, FRACTURES): Vit D, 25-Hydroxy: 18 ng/mL — ABNORMAL LOW (ref 30–100)

## 2019-10-23 LAB — POC URINALSYSI DIPSTICK (AUTOMATED)
Bilirubin, UA: NEGATIVE
Blood, UA: NEGATIVE
Glucose, UA: NEGATIVE
Ketones, UA: NEGATIVE
Leukocytes, UA: NEGATIVE
Nitrite, UA: NEGATIVE
Protein, UA: NEGATIVE
Spec Grav, UA: 1.015 (ref 1.010–1.025)
Urobilinogen, UA: NEGATIVE E.U./dL — AB
pH, UA: 7 (ref 5.0–8.0)

## 2019-10-23 LAB — T4, FREE: Free T4: 1 ng/dL (ref 0.8–1.8)

## 2019-10-23 LAB — TSH: TSH: 0.85 mIU/L (ref 0.40–4.50)

## 2019-10-23 MED ORDER — CEPHALEXIN 500 MG PO CAPS: 500.0000 mg | ORAL_CAPSULE | Freq: Four times a day (QID) | ORAL | 0 refills | Status: AC

## 2019-10-23 MED ORDER — FLUCONAZOLE 150 MG PO TABS: ORAL_TABLET | ORAL | 0 refills | Status: DC

## 2019-10-23 MED FILL — CEPHALEXIN 500 MG CAPSULE: 500 | 7 days supply | Qty: 28 | Fill #0

## 2019-10-23 MED FILL — FLUCONAZOLE 150 MG TABS: 150 | 2 days supply | Qty: 2 | Fill #0

## 2019-10-23 MED FILL — FUROSEMIDE 40 MG TAB: 40 | 30 days supply | Qty: 30 | Fill #0

## 2019-10-23 NOTE — Patient Instructions (Addendum)
Stay hydrated.   Warning signs/symptoms: Uncontrollable nausea/vomiting, fevers, worsening symptoms despite treatment, confusion.  Give [name] around 2 business days to get culture back to you.  If you do not hear anything about your referral in the next 1-2 weeks, call our office and ask for an update.  [phone], [address] [place] [address] [place]  Give [name] 2-3 business days to get the results of your labs back.   I want you to consume 75-100 g of protein daily.   Take progress photos.    Let us know if you need anything.
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
## 2019-10-23 NOTE — Progress Notes (Signed)
Chief Complaint  Patient presents with  . Weight Gain  . Dysuria  . Urinary Frequency    Subjective: Patient is a 59 y.o. female here for wt gain.  Patient has been taking Saxenda daily for the last several months.  She is working with a Clinical research associate and is restricting herself to 1200 cal daily.  She eats around 30 g of protein daily.  She was referred to the medical weight loss management clinic but has not heard anything from them.  She has gained weight.  She is not having any adverse effects from the medication. Has felt a lump in her neck intermittently over past week, would like thyroid levels checked.  Over the past week she has had burning with urination.  Denies any discharge, bleeding, frequency, fevers, nausea/vomiting, abdominal pain, or flank pain.  She has had urinary tract infections in the past and this feels very similar.  Patient continues have migraines.  She was referred to the neurology team but something seem to fallen through.  She is interested in seeing either the bowel or possibly atrium in [place].  Patient has a history of both rheumatoid arthritis and lupus.  She was referred to a rheumatologist but has not heard anything. She was seeing rheum in [place] but prefers to avoid that health care system.   Pt w hx of Vit D def. Takes 5000 u daily in add'n to MV.   ROS: Const: no fevers GU: +dysuria  Past Medical History:  Diagnosis Date  . Anemia   . Benign tumor of bladder   . DVT (deep venous thrombosis) ([place])   . Essential hypertension, benign 03/03/2013  . Fibroma of heart   . Obesity 03/03/2013    Objective: BP 138/88 (BP Location: Right Arm, Patient Position: Sitting, Cuff Size: Large)   Pulse 87   Temp (!) 96.1 F (35.6 C) (Temporal)   Ht 5\' 4"  (1.626 m)   Wt 242 lb (109.8 kg)   SpO2 96%   BMI 41.54 kg/m  General: Awake, appears stated age HEENT: MMM, EOMi Heart: RRR, no LE edema MSK: Neg Lloyd's, no CVA ttp Abd: BS+, S, NT, ND Lungs: CTAB, no  rales, wheezes or rhonchi. No accessory muscle use Psych: Age appropriate judgment and insight, normal affect and mood  Assessment and Plan: Dysuria - Plan: POCT Urinalysis Dipstick (Automated), Urine Culture, cephALEXin (KEFLEX) 500 MG capsule, fluconazole (DIFLUCAN) 150 MG tablet  Other migraine without status migrainosus, not intractable - Plan: Ambulatory referral to Neurology  Morbid obesity ([place])  Vitamin D deficiency - Plan: Vitamin D (25 hydroxy), CANCELED: Vitamin D (25 hydroxy)  Thyromegaly - Plan: T4, free, TSH, CANCELED: TSH, CANCELED: T4, free  Systemic lupus erythematosus, unspecified SLE type, unspecified organ involvement status ([place])  Cont Saxenda. Counseled on diet/exercise. Increase protein intake to 75-100 g/d. # for [facility] team given. Ck labs for Vit D and thyroid. Will look into referral for SLE and RA. Refer to Neuro thru [place] at this time. Tx for UTI empirically, cx pending.  F/u in 1 mo to reck.  The patient voiced understanding and agreement to the plan.  [place], DO 10/23/19  4:14 PM

## 2019-10-23 NOTE — Assessment & Plan Note (Signed)
Work up for possible sleep apnea already in progress through cardiology. She will continue with that plan and see [name] for pulmonary if needed in the future;

## 2019-10-23 NOTE — Patient Instructions (Signed)
Sleep problems will be evaluated through cardiology as planned

## 2019-10-23 NOTE — Progress Notes (Signed)
  Echocardiogram 2D Echocardiogram has been performed.  Destiny Henderson 10/23/2019, 4:05 PM

## 2019-10-24 LAB — URINE CULTURE
MICRO NUMBER:: 10046975
SPECIMEN QUALITY:: ADEQUATE

## 2019-10-24 LAB — ECHOCARDIOGRAM COMPLETE
Height: 64 in
Weight: 3872 oz

## 2019-10-26 ENCOUNTER — Telehealth: Payer: Self-pay

## 2019-10-26 ENCOUNTER — Other Ambulatory Visit: Payer: Self-pay | Admitting: Family Medicine

## 2019-10-26 DIAGNOSIS — E559 Vitamin D deficiency, unspecified: Secondary | ICD-10-CM

## 2019-10-26 MED ORDER — VITAMIN D (ERGOCALCIFEROL) 1.25 MG (50000 UNIT) PO CAPS: 50000.0000 [IU] | ORAL_CAPSULE | ORAL | 0 refills | Status: DC

## 2019-10-26 MED FILL — VIT D2 1.25 MG (50,000 UNIT: 1.25 MG | 28 days supply | Qty: 4 | Fill #0

## 2019-10-26 NOTE — Telephone Encounter (Signed)
PA denied. Medication not a covered benefit.

## 2019-10-26 NOTE — Telephone Encounter (Signed)
PA initiated via Covermymeds; KEY: [id number]. Awaiting determination.

## 2019-10-27 ENCOUNTER — Ambulatory Visit: Payer: 59 | Admitting: Neurology

## 2019-10-27 ENCOUNTER — Telehealth: Payer: Self-pay | Admitting: Family Medicine

## 2019-10-27 ENCOUNTER — Other Ambulatory Visit: Payer: Self-pay | Admitting: Family Medicine

## 2019-10-27 DIAGNOSIS — E559 Vitamin D deficiency, unspecified: Secondary | ICD-10-CM

## 2019-10-27 NOTE — Telephone Encounter (Signed)
Copied from [place] [id number]. Topic: General - Other >> Oct 26, 2019  4:50 PM Destiny Henderson wrote: Reason for CRM: Pt stated she was just returning the call regarding a message left about her labs. Pt requests call back   See result notes

## 2019-10-28 ENCOUNTER — Encounter: Payer: Self-pay | Admitting: Family Medicine

## 2019-10-29 ENCOUNTER — Ambulatory Visit: Payer: 59 | Admitting: Neurology

## 2019-10-30 NOTE — Telephone Encounter (Signed)
This cannot be resubmitted  It was denied.

## 2019-10-30 NOTE — Telephone Encounter (Signed)
called left message to call back

## 2019-10-30 NOTE — Telephone Encounter (Signed)
Medication isn't a covered benefit under her plan.

## 2019-10-30 NOTE — Telephone Encounter (Signed)
We received a call regarding resubmitting since it was denied. The ref# is [facility]

## 2019-10-30 NOTE — Telephone Encounter (Signed)
Let pt know plz and we can try an alternative, but it cost could be barrier. Ty.

## 2019-11-02 NOTE — Telephone Encounter (Signed)
Called left message to call back 

## 2019-11-03 ENCOUNTER — Telehealth: Payer: Self-pay

## 2019-11-03 MED ORDER — NALTREXONE-BUPROPION HCL ER 8-90 MG PO TB12: ORAL_TABLET | ORAL | 2 refills | Status: DC

## 2019-11-03 NOTE — Telephone Encounter (Signed)
Called and spoke to the patient and she would like an alternative sent in

## 2019-11-03 NOTE — Telephone Encounter (Signed)
Called patient back she is requesting to have her office visit on 11/05/2019 switched to a virtual visit. I have done this for her. No further questions.

## 2019-11-03 NOTE — Telephone Encounter (Signed)
Called informed the patient of PCP instructions. She agreed to do so.

## 2019-11-03 NOTE — Telephone Encounter (Signed)
Received a call from patient she was returning call to [facility] RN.She wanted to know her echo and lab results.Results given.Advised I will send message to [facility] for a call back.She wants to be called back at # [phone].

## 2019-11-03 NOTE — Telephone Encounter (Signed)
Contrave sent in. Looks like insurance should cover it, if they do not, she is to contact her insurance company and see what they will cover. Ty.

## 2019-11-03 NOTE — Addendum Note (Signed)
Addended by: Ames Coupe on: 11/03/2019 04:41 PM   Modules accepted: Orders

## 2019-11-03 NOTE — Telephone Encounter (Signed)
Called left message to call back 

## 2019-11-04 ENCOUNTER — Telehealth: Payer: Self-pay

## 2019-11-04 MED FILL — CONTRAVE ER 8-90 MG TABLET: 8-90 | 30 days supply | Qty: 120 | Fill #0

## 2019-11-04 NOTE — Telephone Encounter (Signed)
PA initiated via Covermymeds; KEY: [id number]. Awaiting determination.

## 2019-11-04 NOTE — Telephone Encounter (Signed)
PA denied.   Request Reference Number: [id number]. CONTRAVE TAB [id number]  is denied for not meeting the prior authorization requirement(s). Details of this decision are in the notice attached below or have been faxed to you. Appeals are not supported through [place]. Please refer to the fax case notice for appeals information and instructions.  Preferred alternatives: Diethylpropion, Xenical, or Qsymia

## 2019-11-05 ENCOUNTER — Other Ambulatory Visit: Payer: Self-pay

## 2019-11-05 ENCOUNTER — Telehealth: Payer: PRIVATE HEALTH INSURANCE | Admitting: Cardiology

## 2019-11-06 MED FILL — VIT D2 1.25 MG (50,000 UNIT: 1.25 MG | 28 days supply | Qty: 4 | Fill #0

## 2019-11-09 ENCOUNTER — Telehealth: Payer: Self-pay

## 2019-11-09 ENCOUNTER — Other Ambulatory Visit: Payer: Self-pay

## 2019-11-09 MED ORDER — ORLISTAT 120 MG PO CAPS: 120.0000 mg | ORAL_CAPSULE | Freq: Three times a day (TID) | ORAL | 1 refills | Status: DC

## 2019-11-09 NOTE — Telephone Encounter (Signed)
PA denied. Appeal letter faxed to [employer] at [phone]. Awaiting determination.

## 2019-11-09 NOTE — Telephone Encounter (Signed)
PA initiated via Covermymeds; KEY: [id number]. Awaiting determination.

## 2019-11-10 MED FILL — XENICAL 120 MG CAP: 120 | 30 days supply | Qty: 90 | Fill #0

## 2019-11-10 NOTE — Telephone Encounter (Signed)
PA approved. Effective through 05/09/2020.

## 2019-11-13 ENCOUNTER — Other Ambulatory Visit: Payer: 59

## 2019-11-19 ENCOUNTER — Ambulatory Visit (HOSPITAL_BASED_OUTPATIENT_CLINIC_OR_DEPARTMENT_OTHER): Payer: PRIVATE HEALTH INSURANCE | Attending: Cardiovascular Disease | Admitting: Cardiovascular Disease

## 2019-11-19 ENCOUNTER — Other Ambulatory Visit: Payer: Self-pay

## 2019-11-19 DIAGNOSIS — R0683 Snoring: Secondary | ICD-10-CM

## 2019-11-19 DIAGNOSIS — I10 Essential (primary) hypertension: Secondary | ICD-10-CM

## 2019-11-19 DIAGNOSIS — G479 Sleep disorder, unspecified: Secondary | ICD-10-CM | POA: Diagnosis present

## 2019-11-19 DIAGNOSIS — G4733 Obstructive sleep apnea (adult) (pediatric): Secondary | ICD-10-CM | POA: Diagnosis not present

## 2019-11-20 ENCOUNTER — Other Ambulatory Visit: Payer: PRIVATE HEALTH INSURANCE

## 2019-11-23 DIAGNOSIS — G4733 Obstructive sleep apnea (adult) (pediatric): Secondary | ICD-10-CM

## 2019-11-23 HISTORY — DX: Obstructive sleep apnea (adult) (pediatric): G47.33

## 2019-11-25 ENCOUNTER — Encounter: Payer: Self-pay | Admitting: Neurology

## 2019-11-26 ENCOUNTER — Ambulatory Visit (INDEPENDENT_AMBULATORY_CARE_PROVIDER_SITE_OTHER): Payer: 59 | Admitting: Family Medicine

## 2019-11-26 NOTE — Progress Notes (Signed)
Virtual Visit via Video Note The purpose of this virtual visit is to provide medical care while limiting exposure to the novel coronavirus.    Consent was obtained for video visit:  Yes Answered questions that patient had about telehealth interaction:  Yes I discussed the limitations, risks, security and privacy concerns of performing an evaluation and management service by telemedicine. I also discussed with the patient that there may be a patient responsible charge related to this service. The patient expressed understanding and agreed to proceed.  Pt location: Home Physician Location: office Name of referring provider:  Shelda Pal* I connected with Camora Caroli M Ron at patients initiation/request on 11/30/2019 at 11:10 AM EST by video enabled telemedicine application and verified that I am speaking with the correct person using two identifiers. Pt MRN:  [id number] Pt DOB:  02/01/1961 Video Participants:  Nehemie Caroli M Hector;    History of Present Illness:  Destiny Henderson is a 59 year old female with HTN and history of DVT who presents for migraines and neuropathy.  History supplemented by referring provider note.  Migraines: Onset:  Headaches as a child.  Current severe headaches since 83 years old. Location:  Right temple or back of head and upper neck Quality:  Throbbing  Intensity:  severe.  She denies new headache, thunderclap headache or severe headache that wakes her from sleep. Aura:  none Premonitory Phase:  none Postdrome:  none Associated symptoms:  Nausea, vomiting, blurred vision, photophobia, phonophobia, disoriented, dizziness (but not vertigo), off balance, paresthesias in fingers and legs bilaterally.  She denies weakness. Duration:  3 days severe then lingers (total 4 to 6 days) Frequency:  Every 2 week Frequency of abortive medication: takes ibuprofen or Tylenol 3 days a week. Triggers:  unknown Relieving factors:  sleep Activity:   aggravates  CT head without contrast from 11/25/2018 to evaluate headache was personally reviewed and was normal.  Treats with ibuprofen or Tylenol Extra-strength Current NSAIDS:  ASA 81mg  daily;ibuprofen 800mg  Current analgesics:  Tylenol Extra-strength Current triptans:  none Current ergotamine:  none Current anti-emetic:  Zofran ODT 4mg  Current muscle relaxants:  none Current anti-anxiolytic:  none Current sleep aide:  melatonin Current Antihypertensive medications:  Diltiazem; Lasix Current Antidepressant medications:  none Current Anticonvulsant medications:  none Current anti-CGRP:  none Current Vitamins/Herbal/Supplements:  Co ! 10; B complex; melatonin; B6; B12; D; K-dur; C; fish oil Current Antihistamines/Decongestants:  none Other therapy:  none Hormone/birth control:  none  Past NSAIDS:  Diclofenac 75mg  EC; naproxen 500mg  Past analgesics:  Tylenol #3; tramadol Past abortive triptans:  none Past abortive ergotamine:  none Past muscle relaxants:  Flexeril; Robaxin 500mg  Past anti-emetic:  none Past antihypertensive medications:  Lasix; amlodipine; HCTZ; lisinopril-HCTZ Past antidepressant medications:   none Past anticonvulsant medications:  topiramate Past anti-CGRP:  none Past vitamins/Herbal/Supplements:  none Past antihistamines/decongestants:  none Other past therapies:  none  Caffeine:  1 cup coffee 5 days a day.  No soda. Diet:  32 oz water daily.  No soda.  Sometimes skips meals. Exercise:  no Depression:  none; Anxiety:  none Other pain:  Back pain.  S/p L4-5 surgery in 1993 Sleep hygiene:  Sleep okay.  She recently had a sleep study that is pending. Family history of headache:  Daughters; son; father; grandmother  Neuropathy: Since around 2015, she has had polyarthralgia with joint stiffness and myalgias.  She also endorsed dry mouth and eyes.  She reports burning and swelling in the legs and feet.  No significant numbness in hands.  No weakness.  In  December 2019, she had a positive ANA with titer of 1:320 with negative RF and sed rate 49.  She saw rheumatology.  Labs from March 2020 included B12 344; Hgb A1c 5.8; low D 25-hydroxy 12; TSH 0.845; B6 26; RNP antibody negative; beta-2-glycoprotein IgG/IgM/IgA negative; cardiolipin IgG/IgM/IgA negative; centromere antibody negative; lupus anticoagulant negative; CRP elevated 9.9;  dsDNA negative; G6PD 12.1; Scl 70 ab negative; SSA/SSB abs negative; anti-Smith negative; B1 124; A primary rheumatologic disease was ruled out.  Neuropathy was suspected and she was supposed to have a NCV-EMG which was never scheduled.  She has chronic low back pain and underwent L4-L5 laminectomy in 1993.  MRI lumbar spine without contrast on 03/18/2018 showed small to moderate sized left paracentral disc protrusions at L2-3 and L3-4 as well as moderate degenerative disc disease with remote post-surgical changes and no significant spinal or foraminal stenosis.   Past Medical History: Past Medical History:  Diagnosis Date  . Anemia   . Benign tumor of bladder   . DVT (deep venous thrombosis) ([place])   . Essential hypertension, benign 03/03/2013  . Fibroma of heart   . Obesity 03/03/2013    Medications: Outpatient Encounter Medications as of 11/30/2019  Medication Sig  . aspirin EC 81 MG tablet Take 1 tablet (81 mg total) by mouth 2 (two) times daily.  Marland Kitchen b complex vitamins capsule Take 1 capsule by mouth daily.  . Coenzyme Q10 (CO Q 10 PO) Take by mouth.  . diltiazem (CARDIZEM CD) 300 MG 24 hr capsule Take 1 capsule (300 mg total) by mouth daily.  . Echinacea 500 MG CAPS Take by mouth.  . fish oil-omega-3 fatty acids 1000 MG capsule Take 2 g by mouth daily.  . Flaxseed, Linseed, 1000 MG CAPS Take by mouth.  . fluconazole (DIFLUCAN) 150 MG tablet Take 1 tab, repeat in 48 hours if no improvement.  Marland Kitchen ibuprofen (ADVIL) 800 MG tablet Take 1 tablet (800 mg total) by mouth every 8 (eight) hours as needed (pain).  . Melatonin  5 MG CAPS Take by mouth as needed.   . Multiple Vitamins-Minerals (MEGA MULTIVITAMIN FOR WOMEN) TABS Take by mouth.  . orlistat (XENICAL) 120 MG capsule Take 1 capsule (120 mg total) by mouth 3 (three) times daily with meals.  . potassium chloride (K-DUR) 10 MEQ tablet Take 1 tablet (10 mEq total) by mouth daily.  . Pyridoxine HCl (VITAMIN B-6) 500 MG tablet Take 500 mg by mouth daily.  Marland Kitchen telmisartan-hydrochlorothiazide (MICARDIS HCT) 80-25 MG tablet Take 1 tablet by mouth daily.  Marland Kitchen VITAMIN A OP Apply to eye.  . vitamin B-12 (CYANOCOBALAMIN) 1000 MCG tablet Take 1,000 mcg by mouth daily.  . vitamin C (ASCORBIC ACID) 500 MG tablet Take 500 mg by mouth daily.  Marland Kitchen VITAMIN D, CHOLECALCIFEROL, PO Take by mouth.  . Vitamin D, Ergocalciferol, (DRISDOL) 1.25 MG (50000 UNIT) CAPS capsule Take 1 capsule (50,000 Units total) by mouth every 7 (seven) days.  . furosemide (LASIX) 40 MG tablet Take 1 tablet (40 mg total) by mouth daily.  Marland Kitchen spironolactone (ALDACTONE) 25 MG tablet Take 0.5 tablets (12.5 mg total) by mouth daily.   No facility-administered encounter medications on file as of 11/30/2019.    Allergies: Allergies  Allergen Reactions  . Latex Rash    Other reaction(s): Chest Pain  . Lisinopril Swelling    Angioedema   . Lasix [Furosemide] Rash and Other (See Comments)    Shortness  of breath  . Prednisone Rash  . Sulfur Rash    Family History: Family History  Problem Relation Age of Onset  . Breast cancer Mother   . Hypertension Mother   . Hyperlipidemia Mother   . Colon cancer Father   . Cancer Father   . Thyroid disease Sister   . Cancer Other   . Thyroid disease Sister   . Hypertension Maternal Aunt   . Heart failure Maternal Aunt   . Cancer Maternal Aunt   . Hypertension Maternal Aunt   . Hypertension Maternal Uncle   . Diabetes Maternal Uncle   . Heart failure Maternal Grandmother   . Cancer Maternal Grandfather        prostate    Social History: Social History    Socioeconomic History  . Marital status: Married    Spouse name: Not on file  . Number of children: 3  . Years of education: 13  . Highest education level: Not on file  Occupational History  . Occupation: Network engineer  Tobacco Use  . Smoking status: Never Smoker  . Smokeless tobacco: Never Used  Substance and Sexual Activity  . Alcohol use: No  . Drug use: No  . Sexual activity: Not on file  Other Topics Concern  . Not on file  Social History Narrative  . Not on file   Social Determinants of Health   Financial Resource Strain:   . Difficulty of Paying Living Expenses: Not on file  Food Insecurity:   . Worried About Charity fundraiser in the Last Year: Not on file  . Ran Out of Food in the Last Year: Not on file  Transportation Needs:   . Lack of Transportation (Medical): Not on file  . Lack of Transportation (Non-Medical): Not on file  Physical Activity:   . Days of Exercise per Week: Not on file  . Minutes of Exercise per Session: Not on file  Stress:   . Feeling of Stress : Not on file  Social Connections:   . Frequency of Communication with Friends and Family: Not on file  . Frequency of Social Gatherings with Friends and Family: Not on file  . Attends Religious Services: Not on file  . Active Member of Clubs or Organizations: Not on file  . Attends Archivist Meetings: Not on file  . Marital Status: Not on file  Intimate Partner Violence:   . Fear of Current or Ex-Partner: Not on file  . Emotionally Abused: Not on file  . Physically Abused: Not on file  . Sexually Abused: Not on file    Observations/Objective:   Height 5\' 4"  (1.626 m), weight 240 lb (108.9 kg). No acute distress.  Alert and oriented.  Speech fluent and not dysarthric.  Language intact.  Eyes orthophoric on primary gaze.  Face symmetric.  Assessment and Plan:   1.  Migraine with aura, without status migrainosus, intractable.  Given the intractable prolonged duration of her  headaches, as well as the unusual associated symptoms such as confusion, will check MRI of brain. 2.  Neuropathy.  1. MRI of brain without contrast  2. For preventative management, start nortriptyline 10mg  at bedtime.  We can increase to 25mg  at bedtime in 4 weeks if needed. 3.  For abortive therapy, naproxen 500mg .  Stop Tylenol and ibuprofen 4.  Limit use of pain relievers to no more than 2 days out of week to prevent risk of rebound or medication-overuse headache. 5.  Keep headache diary 6.  Exercise, hydration, caffeine cessation, sleep hygiene, monitor for and avoid triggers 7.  Will check NCV-EMG of lower extremities 8. Follow up in office after NCV-EMG   Follow Up Instructions:    -I discussed the assessment and treatment plan with the patient. The patient was provided an opportunity to ask questions and all were answered. The patient agreed with the plan and demonstrated an understanding of the instructions.   The patient was advised to call back or seek an in-person evaluation if the symptoms worsen or if the condition fails to improve as anticipated.    Dudley Major, DO

## 2019-11-30 ENCOUNTER — Other Ambulatory Visit: Payer: Self-pay

## 2019-11-30 ENCOUNTER — Encounter: Payer: Self-pay | Admitting: Neurology

## 2019-11-30 ENCOUNTER — Telehealth (INDEPENDENT_AMBULATORY_CARE_PROVIDER_SITE_OTHER): Payer: PRIVATE HEALTH INSURANCE | Admitting: Neurology

## 2019-11-30 VITALS — Ht 64.0 in | Wt 240.0 lb

## 2019-11-30 DIAGNOSIS — G629 Polyneuropathy, unspecified: Secondary | ICD-10-CM | POA: Diagnosis not present

## 2019-11-30 DIAGNOSIS — G43111 Migraine with aura, intractable, with status migrainosus: Secondary | ICD-10-CM

## 2019-11-30 MED ORDER — NORTRIPTYLINE HCL 10 MG PO CAPS: 10.0000 mg | ORAL_CAPSULE | Freq: Every day | ORAL | 3 refills | Status: DC

## 2019-11-30 MED ORDER — NAPROXEN 500 MG PO TABS: 500.0000 mg | ORAL_TABLET | Freq: Two times a day (BID) | ORAL | 3 refills | Status: DC | PRN

## 2019-11-30 MED FILL — NAPROXEN 500 MG TABS: 500 | 10 days supply | Qty: 20 | Fill #0

## 2019-11-30 MED FILL — NORTRIPTYLINE HCL 10 MG CAP: 10 | 30 days supply | Qty: 30 | Fill #0

## 2019-12-03 ENCOUNTER — Ambulatory Visit: Payer: 59 | Admitting: Neurology

## 2019-12-07 ENCOUNTER — Encounter (HOSPITAL_BASED_OUTPATIENT_CLINIC_OR_DEPARTMENT_OTHER): Payer: Self-pay | Admitting: Cardiovascular Disease

## 2019-12-07 NOTE — Procedures (Signed)
     Patient Name: Destiny Henderson, Destiny Henderson Date: 11/21/2019 Gender: Female D.O.B: 01-06-1961 Age (years): 22 Referring Provider: Loel Dubonnet NP Height (inches): 64 Interpreting Physician: Shelva Majestic MD, ABSM Weight (lbs): 240 RPSGT: Jonna Coup BMI: 41 MRN: [id number] Neck Size: <br>  CLINICAL INFORMATION Sleep Study Type: HST  Indication for sleep study: Snoring, resistant hypertension, difficulty sleeping.  Epworth Sleepiness Score: 20  SLEEP STUDY TECHNIQUE A multi-channel overnight portable sleep study was performed. The channels recorded were: nasal airflow, thoracic respiratory movement, and oxygen saturation with a pulse oximetry. Snoring was also monitored.  MEDICATIONS aspirin EC 81 MG tablet  b complex vitamins capsule  Coenzyme Q10 (CO Q 10 PO)  diltiazem (CARDIZEM CD) 300 MG 24 hr capsule  Echinacea 500 MG CAPS  fish oil-omega-3 fatty acids 1000 MG capsule  Flaxseed, Linseed, 1000 MG CAPS  fluconazole (DIFLUCAN) 150 MG tablet  furosemide (LASIX) 40 MG tablet(Expired)  ibuprofen (ADVIL) 800 MG tablet  Melatonin 5 MG CAPS  Multiple Vitamins-Minerals (MEGA MULTIVITAMIN FOR WOMEN) TABS  naproxen (NAPROSYN) 500 MG tablet  nortriptyline (PAMELOR) 10 MG capsule  orlistat (XENICAL) 120 MG capsule  potassium chloride (K-DUR) 10 MEQ tablet  Pyridoxine HCl (VITAMIN B-6) 500 MG tablet  spironolactone (ALDACTONE) 25 MG tablet(Expired)  telmisartan-hydrochlorothiazide (MICARDIS HCT) 80-25 MG tablet  VITAMIN A OP  vitamin B-12 (CYANOCOBALAMIN) 1000 MCG tablet  vitamin C (ASCORBIC ACID) 500 MG tablet  VITAMIN D, CHOLECALCIFEROL, PO  Vitamin D, Ergocalciferol, (DRISDOL) 1.25 MG (50000 UNIT) CAPS capsule   Patient self administered medications include: N/A.  SLEEP ARCHITECTURE Patient was studied for 351.5 minutes. The sleep efficiency was 88.1 % and the patient was supine for 60.6%. The arousal index was 0.0 per hour.  RESPIRATORY PARAMETERS The  overall AHI was 7.3 per hour, with a central apnea index of 0.0 per hour.  The oxygen nadir was 89% during sleep.  CARDIAC DATA Mean heart rate during sleep was 68.3 bpm.  IMPRESSIONS - Mild obstructive sleep apnea occurred during this study (AHI 7.3/h). The potential severity of sleep apnea during REM sleep cannot be assessed on this home study. - No significant central sleep apnea occurred during this study (CAI = 0.0/h). - Mild oxygen desaturation to a nadir of 89%. - Patient snored 16.0% during the sleep.  DIAGNOSIS - Obstructive Sleep Apnea (327.23 [G47.33 ICD-10])  RECOMMENDATIONS - In this symptomatic patient with significant cardiovascular comorbidities including difficult to control resistant hypertension recommend an in-lab CPAP titration to assess REM sleep and determine optimal pressure required to alleviate sleep disordered breathing. - Effort should be made to optimize nasal and oropharyngeal patency. - If patient is against CPAP therapy an alternative such as a customized oral appliance may be considered. - Avoid alcohol, sedatives and other CNS depressants that may worsen sleep apnea and disrupt normal sleep architecture. - Sleep hygiene should be reviewed to assess factors that may improve sleep quality. - Weight management (BMI 41) and regular exercise should be initiated or continued. - Recommend a download after 30 days of CPAP therapy and sleep clinic evaluation.     [Electronically signed] 12/07/2019 02:55 PM  Shelva Majestic MD, Tomasa Hose Diplomate, American Board of Sleep Medicine   NPI: [id number] [place] PH: [phone]   FX: [phone] [place]

## 2019-12-08 ENCOUNTER — Encounter (INDEPENDENT_AMBULATORY_CARE_PROVIDER_SITE_OTHER): Payer: Self-pay

## 2019-12-08 ENCOUNTER — Telehealth: Payer: Self-pay | Admitting: Family Medicine

## 2019-12-08 NOTE — Telephone Encounter (Signed)
Patient states that she needs a CPE (physical appointment )  Before March 7,2021 for work purposes.  Patient also states that her mother and sister have breast cancer and she would like to be referred to genetic consulting.   Patient would also like results back from her Sleep Study Test.

## 2019-12-09 ENCOUNTER — Telehealth: Payer: Self-pay | Admitting: Cardiology

## 2019-12-09 NOTE — Telephone Encounter (Signed)
Returned call to patient she stated her B/P has been elevated today 192/104 pulse 68.Stated she has been sob with not much exertion.Increased swelling in both lower legs.Stated she is presently resting in bed she has a bad headache.Virtual appointment scheduled with Jory Sims DNP 12/10/19 at 11:45 am.Advised I will send message to [facility] for advice.

## 2019-12-09 NOTE — Telephone Encounter (Signed)
Left message for patient to return call.

## 2019-12-09 NOTE — Progress Notes (Signed)
Virtual Visit via Video Note   This visit type was conducted due to national recommendations for restrictions regarding the COVID-19 Pandemic (e.g. social distancing) in an effort to limit this patient's exposure and mitigate transmission in our community.  Due to her co-morbid illnesses, this patient is at least at moderate risk for complications without adequate follow up.  This format is felt to be most appropriate for this patient at this time.  All issues noted in this document were discussed and addressed.  A limited physical exam was performed with this format.  Please refer to the patient's chart for her consent to telehealth for [facility].   Date:  12/10/2019   ID:  Destiny Henderson, DOB Nov 08, 1960, MRN [id number]  Patient Location: Home Provider Location: Home  PCP:  Shelda Pal, DO  Cardiologist:  Jenne Campus, MD  Electrophysiologist:  None   Evaluation Performed:  Follow-Up Visit  Chief Complaint:  Uncontrolled HTN   History of Present Illness:    Evea Caroli LAJUAN Henderson is a 59 y.o. female for ongoing assessment and management of hypertension, usually elevated significantly prior to taking her medications.  She was asked to take her medications prior to taking her blood pressure readings on last visit with her primary cardiologist Dr. Agustin Cree, on 10/08/2019.  She was advised to exercise on a regular basis and lose some weight as she did have morbid obesity.  An echocardiogram was ordered to evaluate LVEF especially for evidence of LVH in the setting of hypertension.  She was also to have fasting lipids and LFTs.  Echocardiogram dated 10/23/2019 did reveal moderately increased left ventricular posterior wall thickness and moderately increased left ventricular hypertrophy with a normal EF of 60% to 65%.  She was found to have grade 1 diastolic dysfunction.  There were no valvular abnormalities.  She did not have pulmonary hypertension per echocardiogram  measurements.  She has also had a sleep study on 11/19/2019 which was read by Dr. Shelva Majestic.  This revealed mild obstructive sleep apnea.  The potential severity of sleep apnea during REM sleep could not be assessed on home study.  She was diagnosed with obstructive sleep apnea and it was recommended that she have a in lab CPAP titration study to evaluate more thoroughly.  She called her office on 12/09/2019 with complaints of shortness of breath especially with exertion.  This has been going on for couple of weeks.  As result of her ongoing symptoms she was to be seen today.  She denies salt intake and is working with Bariatric medicine for weight loss. She is continuing to have elevated BP at home, with associated symptoms with daytime fatigue and somnolence.   The patient does not have symptoms concerning for COVID-19 infection (fever, chills, cough, or new shortness of breath).    Past Medical History:  Diagnosis Date  . Anemia   . Benign tumor of bladder   . DVT (deep venous thrombosis) ([place])   . Essential hypertension, benign 03/03/2013  . Fibroma of heart   . Obesity 03/03/2013   Past Surgical History:  Procedure Laterality Date  . BACK SURGERY    . bladder tack    . DILATION AND CURETTAGE OF UTERUS     after SAB  . HEART TUMOR EXCISION       Current Meds  Medication Sig  . b complex vitamins capsule Take 1 capsule by mouth daily.  . Coenzyme Q10 (CO Q 10 PO) Take by mouth.  Marland Kitchen  diltiazem (CARDIZEM CD) 300 MG 24 hr capsule Take 1 capsule (300 mg total) by mouth daily.  . Echinacea 500 MG CAPS Take by mouth.  . fish oil-omega-3 fatty acids 1000 MG capsule Take 2 g by mouth daily.  . Flaxseed, Linseed, 1000 MG CAPS Take by mouth.  . furosemide (LASIX) 40 MG tablet Take 1 tablet (40 mg total) by mouth daily.  Marland Kitchen ibuprofen (ADVIL) 800 MG tablet Take 1 tablet (800 mg total) by mouth every 8 (eight) hours as needed (pain).  . Melatonin 5 MG CAPS Take by mouth as needed.   .  Multiple Vitamins-Minerals (MEGA MULTIVITAMIN FOR WOMEN) TABS Take by mouth.  . naproxen (NAPROSYN) 500 MG tablet Take 1 tablet (500 mg total) by mouth 2 (two) times daily as needed.  . nortriptyline (PAMELOR) 10 MG capsule Take 1 capsule (10 mg total) by mouth at bedtime.  Marland Kitchen orlistat (XENICAL) 120 MG capsule Take 1 capsule (120 mg total) by mouth 3 (three) times daily with meals.  . potassium chloride (K-DUR) 10 MEQ tablet Take 1 tablet (10 mEq total) by mouth daily.  . Pyridoxine HCl (VITAMIN B-6) 500 MG tablet Take 500 mg by mouth daily.  Marland Kitchen spironolactone (ALDACTONE) 25 MG tablet Take 0.5 tablets (12.5 mg total) by mouth daily.  Marland Kitchen telmisartan-hydrochlorothiazide (MICARDIS HCT) 80-25 MG tablet Take 1 tablet by mouth daily.  Marland Kitchen VITAMIN A OP Apply to eye.  . vitamin B-12 (CYANOCOBALAMIN) 1000 MCG tablet Take 1,000 mcg by mouth daily.  . vitamin C (ASCORBIC ACID) 500 MG tablet Take 500 mg by mouth daily.  Marland Kitchen VITAMIN D, CHOLECALCIFEROL, PO Take by mouth.     Allergies:   Latex, Lisinopril, Lasix [furosemide], Prednisone, and Sulfur   Social History   Tobacco Use  . Smoking status: Never Smoker  . Smokeless tobacco: Never Used  Substance Use Topics  . Alcohol use: No  . Drug use: No     Family Hx: The patient's family history includes Breast cancer in her mother; Cancer in her father, maternal aunt, maternal grandfather, and another family member; Colon cancer in her father; Diabetes in her maternal uncle; Heart failure in her maternal aunt and maternal grandmother; Hyperlipidemia in her mother; Hypertension in her maternal aunt, maternal aunt, maternal uncle, and mother; Thyroid disease in her sister and sister.  ROS:   Please see the history of present illness.    All other systems reviewed and are negative.   Prior CV studies:   The following studies were reviewed today: See above sleep study results.   Labs/Other Tests and Data Reviewed:    EKG:  No ECG reviewed.  Recent  Labs: 07/30/2019: Hemoglobin 11.5; Platelets 371.0; Pro B Natriuretic peptide (BNP) 28.0 08/07/2019: ALT 12; BUN 12; Creatinine, Ser 1.00; Potassium 3.8; Sodium 145 10/23/2019: TSH 0.85   Recent Lipid Panel Lab Results  Component Value Date/Time   CHOL 201 (H) 08/07/2019 02:22 PM   TRIG 112.0 08/07/2019 02:22 PM   HDL 45.20 08/07/2019 02:22 PM   CHOLHDL 4 08/07/2019 02:22 PM   LDLCALC 133 (H) 08/07/2019 02:22 PM    Wt Readings from Last 3 Encounters:  12/10/19 240 lb (108.9 kg)  11/25/19 240 lb (108.9 kg)  10/23/19 242 lb (109.8 kg)     Objective:    Vital Signs:  BP (!) 188/99   Pulse (!) 52   Ht 5\' 4"  (1.626 m)   Wt 240 lb (108.9 kg)   BMI 41.20 kg/m    VITAL SIGNS:  reviewed GEN:  no acute distress RESPIRATORY:  normal respiratory effort, symmetric expansion NEURO:  alert and oriented x 3, no obvious focal deficit PSYCH:  normal affect  ASSESSMENT & PLAN:   1. Uncontrolled hypertension: She is on multiple medications with inadequate control. Maybe related to untreated OSA, with abnormal home sleep study.  I will begin hydralazine 50 mg BID until she can have her inpatient sleep study with CPAP titration. She is to keep a log of her BP so that we can watch trends and be seen in the office in one month for evaluation of her symptoms and response to medications.   2. OSA: Abnormal home sleep study indicating need for inpatient sleep study and CPAP titration. I have explained this to her. She will be referred to [place], for scheduling and education about the test.  DOE likely due to this. HOWEVER, if she begins to have chest pain may need to consider CT of the chest to rule out PE with history of DVT, not currently on anticoagulation.  3. Diastolic CHF: No significant fluid overload, some dependent edema, likely from CCB. She is to keep feet elevated and avoid salt.   COVID-19 Education: The signs and symptoms of COVID-19 were discussed with the patient and how to  seek care for testing (follow up with PCP or arrange E-visit). The importance of social distancing was discussed today.  Time:   Today, I have spent 61minutes with the patient with telehealth technology discussing the above problems.     Medication Adjustments/Labs and Tests Ordered: Current medicines are reviewed at length with the patient today.  Concerns regarding medicines are outlined above.   Tests Ordered: No orders of the defined types were placed in this encounter.   Medication Changes: Meds ordered this encounter  Medications  . hydrALAZINE (APRESOLINE) 50 MG tablet    Sig: Take 1 tablet (50 mg total) by mouth in the morning and at bedtime.    Dispense:  180 tablet    Refill:  3    Disposition:  Follow up  One month and with Dr. Claiborne Billings post sleep study.   Signed, Phill Myron. West Pugh, ANP, AACC  12/10/2019 2:38 PM    [facility]

## 2019-12-09 NOTE — Telephone Encounter (Signed)
Called left detailed message to call back. Last CPE was on 08/07/2019 and PCP stated on AVS to schedule in one year from that date.

## 2019-12-09 NOTE — Telephone Encounter (Signed)
LVM for pt to call back and schedule for CPE if appropriate (based on last CPE date).

## 2019-12-09 NOTE — Telephone Encounter (Signed)
Pt c/o Shortness Of Breath: STAT if SOB developed within the last 24 hours or pt is noticeably SOB on the phone  1. Are you currently SOB (can you hear that pt is SOB on the phone)? No   2. How long have you been experiencing SOB? Couple of weeks  3. Are you SOB when sitting or when up moving around? Moving around  4. Are you currently experiencing any other symptoms? Swelling in ankles, headache, dizziness, & a BP of 192/104  Destiny Henderson is calling complaining of SOB, Swelling in ankles, Headache, Dizziness, and a BP of 192/104. She states the SOB only occurs when she is moving around. The patient has an appointment scheduled for this on 12/31/19, but would like a sooner appt if possible (requesting a Thursday or Friday in the afternoon). She states she was previously speaking with a nurse in regards to this and got disconnected, but there was no documentation to try and reconnect. She would also like her Echo and Sleep study results when called back. Please advise.

## 2019-12-10 ENCOUNTER — Encounter: Payer: Self-pay | Admitting: Adult Health

## 2019-12-10 ENCOUNTER — Telehealth (INDEPENDENT_AMBULATORY_CARE_PROVIDER_SITE_OTHER): Payer: PRIVATE HEALTH INSURANCE | Admitting: Adult Health

## 2019-12-10 ENCOUNTER — Ambulatory Visit: Payer: PRIVATE HEALTH INSURANCE | Admitting: Adult Health

## 2019-12-10 VITALS — BP 188/99 | HR 52 | Ht 64.0 in | Wt 240.0 lb

## 2019-12-10 DIAGNOSIS — I10 Essential (primary) hypertension: Secondary | ICD-10-CM

## 2019-12-10 DIAGNOSIS — G4733 Obstructive sleep apnea (adult) (pediatric): Secondary | ICD-10-CM

## 2019-12-10 DIAGNOSIS — I5032 Chronic diastolic (congestive) heart failure: Secondary | ICD-10-CM

## 2019-12-10 DIAGNOSIS — R0609 Other forms of dyspnea: Secondary | ICD-10-CM

## 2019-12-10 MED ORDER — HYDRALAZINE HCL 50 MG PO TABS: 50.0000 mg | ORAL_TABLET | Freq: Two times a day (BID) | ORAL | 3 refills | Status: DC

## 2019-12-10 MED FILL — hydrALAZINE HCL 50 MG TABS: 50 | 30 days supply | Qty: 60 | Fill #0

## 2019-12-10 NOTE — Patient Instructions (Signed)
Medication Instructions:   START- Hydralazine 50 mg by mouth twice a day  *If you need a refill on your cardiac medications before your next appointment, please call your pharmacy*   Lab Work: None Ordered  Testing/Procedures: None Ordered   Follow-Up: At [facility], you and your health needs are our priority.  As part of our continuing mission to provide you with exceptional heart care, we have created designated Provider Care Teams.  These Care Teams include your primary Cardiologist (physician) and Advanced Practice Providers (APPs -  Physician Assistants and Nurse Practitioners) who all work together to provide you with the care you need, when you need it.  We recommend signing up for the patient portal called "MyChart".  Sign up information is provided on this After Visit Summary.  MyChart is used to connect with patients for Virtual Visits (Telemedicine).  Patients are able to view lab/test results, encounter notes, upcoming appointments, etc.  Non-urgent messages can be sent to your provider as well.   To learn more about what you can do with MyChart, go to [email or web address].    Your next appointment:   1 month(s)  The format for your next appointment:   In Person  Provider:   Pharmacist for blood pressure check

## 2019-12-11 NOTE — Telephone Encounter (Signed)
Called left message to call back 

## 2019-12-14 NOTE — Telephone Encounter (Signed)
Called unable to get patient on the phone.

## 2019-12-15 NOTE — Telephone Encounter (Signed)
Left message for patient to return call.

## 2019-12-16 ENCOUNTER — Encounter: Payer: PRIVATE HEALTH INSURANCE | Admitting: Neurology

## 2019-12-16 ENCOUNTER — Telehealth: Payer: Self-pay | Admitting: *Deleted

## 2019-12-16 NOTE — Telephone Encounter (Signed)
Attempted to call patient again no answer and voicemail box is full will continue efforts.

## 2019-12-16 NOTE — Telephone Encounter (Signed)
Note sent through my chart to contact office for sleep study results.

## 2019-12-17 ENCOUNTER — Encounter (INDEPENDENT_AMBULATORY_CARE_PROVIDER_SITE_OTHER): Payer: Self-pay

## 2019-12-17 ENCOUNTER — Ambulatory Visit (INDEPENDENT_AMBULATORY_CARE_PROVIDER_SITE_OTHER): Payer: PRIVATE HEALTH INSURANCE | Admitting: Family Medicine

## 2019-12-17 MED FILL — XENICAL 120 MG CAP: 120 | 30 days supply | Qty: 90 | Fill #0

## 2019-12-17 MED FILL — CARTIA XT 300 MG CAPSULE SA: 300 | 30 days supply | Qty: 30 | Fill #1

## 2019-12-22 NOTE — Telephone Encounter (Signed)
Attempted to call patient. Voicemail still full and no answer.

## 2019-12-23 ENCOUNTER — Ambulatory Visit (INDEPENDENT_AMBULATORY_CARE_PROVIDER_SITE_OTHER): Payer: PRIVATE HEALTH INSURANCE | Admitting: Neurology

## 2019-12-23 ENCOUNTER — Other Ambulatory Visit: Payer: Self-pay

## 2019-12-23 DIAGNOSIS — M79604 Pain in right leg: Secondary | ICD-10-CM

## 2019-12-23 DIAGNOSIS — Z0289 Encounter for other administrative examinations: Secondary | ICD-10-CM

## 2019-12-23 DIAGNOSIS — G629 Polyneuropathy, unspecified: Secondary | ICD-10-CM | POA: Diagnosis not present

## 2019-12-23 NOTE — Procedures (Signed)
[facility]  [address]  [place], [place] [address] Tel: [phone] Fax:  [phone] Test Date:  12/23/2019  Patient: Destiny Henderson DOB: 1961/06/15 Physician: Narda Amber, DO  Sex: Female Height: 5\' 4"  Ref Phys: Metta Clines, D.O.  ID#: [id number] Temp: 32.0C Technician:    Patient Complaints: This is a 59 year old female with history of lumbar surgery referred for evaluation of bilateral leg burning pain.  NCV & EMG Findings: Electrodiagnostic testing of the right lower extremity and additional studies of the left shows: 1. Bilateral sural and superficial peroneal sensory responses are within normal limits. 2. Bilateral peroneal and tibial motor responses are within normal limits. 3. Bilateral tibial H reflex studies are within normal limits. 4. There is no evidence of active or chronic motor axonal changes affecting any of the tested muscles.  Motor unit configuration and recruitment pattern is within normal limits.   Impression: This is a normal study of the lower extremities.  In particular, there is no evidence of a sensorimotor polyneuropathy or lumbosacral radiculopathy.   ___________________________ Narda Amber, DO    Nerve Conduction Studies Anti Sensory Summary Table   Stim Site NR Peak (ms) Norm Peak (ms) P-T Amp (V) Norm P-T Amp  Left Sup Peroneal Anti Sensory (Ant Lat Mall)  32C  12 cm    2.1 <4.6 10.0 >4  Right Sup Peroneal Anti Sensory (Ant Lat Mall)  32C  12 cm    2.2 <4.6 11.1 >4  Left Sural Anti Sensory (Lat Mall)  32C  Calf    3.1 <4.6 14.8 >4  Right Sural Anti Sensory (Lat Mall)  32C  Calf    2.3 <4.6 12.3 >4   Motor Summary Table   Stim Site NR Onset (ms) Norm Onset (ms) O-P Amp (mV) Norm O-P Amp Site1 Site2 Delta-0 (ms) Dist (cm) Vel (m/s) Norm Vel (m/s)  Left Peroneal Motor (Ext Dig Brev)  32C  Ankle    3.8 <6.0 11.3 >2.5 B Fib Ankle 6.8 38.0 56 >40  B Fib    10.6  10.9  Poplt B Fib 1.2 8.0 67 >40   Poplt    11.8  10.7         Right Peroneal Motor (Ext Dig Brev)  32C  Ankle    2.7 <6.0 11.3 >2.5 B Fib Ankle 7.7 37.0 48 >40  B Fib    10.4  11.2  Poplt B Fib 1.2 8.0 67 >40  Poplt    11.6  10.7         Left Tibial Motor (Abd Hall Brev)  32C  Ankle    3.4 <6.0 8.3 >4 Knee Ankle 8.6 38.0 44 >40  Knee    12.0  5.9         Right Tibial Motor (Abd Hall Brev)  32C  Ankle    3.9 <6.0 8.5 >4 Knee Ankle 9.5 42.0 44 >40  Knee    13.4  6.3          H Reflex Studies   NR H-Lat (ms) Lat Norm (ms) L-R H-Lat (ms)  Left Tibial (Gastroc)  32C     33.61 <35 4.08  Right Tibial (Gastroc)  32C     29.52 <35 4.08   EMG   Side Muscle Ins Act Fibs Psw Fasc Number Recrt Dur Dur. Amp Amp. Poly Poly. Comment  Left AntTibialis Nml Nml Nml Nml Nml Nml Nml Nml Nml Nml Nml Nml N/A  Left Gastroc Nml  Nml Nml Nml Nml Nml Nml Nml Nml Nml Nml Nml N/A  Left Flex Dig Long Nml Nml Nml Nml Nml Nml Nml Nml Nml Nml Nml Nml N/A  Left RectFemoris Nml Nml Nml Nml Nml Nml Nml Nml Nml Nml Nml Nml N/A  Left GluteusMed Nml Nml Nml Nml Nml Nml Nml Nml Nml Nml Nml Nml N/A  Right AntTibialis Nml Nml Nml Nml Nml Nml Nml Nml Nml Nml Nml Nml N/A  Right Gastroc Nml Nml Nml Nml Nml Nml Nml Nml Nml Nml Nml Nml N/A  Right Flex Dig Long Nml Nml Nml Nml Nml Nml Nml Nml Nml Nml Nml Nml N/A  Right RectFemoris Nml Nml Nml Nml Nml Nml Nml Nml Nml Nml Nml Nml N/A  Right GluteusMed Nml Nml Nml Nml Nml Nml Nml Nml Nml Nml Nml Nml N/A      Waveforms:

## 2019-12-30 ENCOUNTER — Telehealth: Payer: Self-pay | Admitting: Cardiology

## 2019-12-30 NOTE — Telephone Encounter (Signed)
Please see additional telephone encounter.

## 2019-12-30 NOTE — Telephone Encounter (Signed)
Attempted to call patient to confirm that her appointment has been switched to virtual. No answer and voicemail was full. Will continue efforts.

## 2019-12-30 NOTE — Telephone Encounter (Signed)
Called patient again to follow up on this phone call because I still was never able to reach the patient. She said she already had a virtual visit with a Nurse practitioner Olga Millers and didn't understand why I was calling about this phone call. I informed her it is because I have still not been able to get a hold of her and wanted to make sure she was ok. She then began saying that she has a appointment with Dr. Agustin Cree tomorrow and someone was supposed to be calling her back to confirm that it got switched to virtual because she was not coming in the office. Will consult with Dr. Agustin Cree.

## 2019-12-30 NOTE — Telephone Encounter (Signed)
Patient is requesting that her appt on 12/31/19 be changed to Virtual. Is this okay? She stated that it says Virtual on her reminder that was sent home with her.

## 2019-12-31 ENCOUNTER — Telehealth: Payer: Self-pay | Admitting: Cardiology

## 2019-12-31 ENCOUNTER — Other Ambulatory Visit: Payer: Self-pay | Admitting: Neurology

## 2019-12-31 ENCOUNTER — Telehealth (INDEPENDENT_AMBULATORY_CARE_PROVIDER_SITE_OTHER): Payer: PRIVATE HEALTH INSURANCE | Admitting: Cardiology

## 2019-12-31 ENCOUNTER — Ambulatory Visit
Admission: RE | Admit: 2019-12-31 | Discharge: 2019-12-31 | Disposition: A | Discharge status: Home / Self Care | Payer: PRIVATE HEALTH INSURANCE | Source: Ambulatory Visit | Attending: Neurology | Admitting: Neurology

## 2019-12-31 ENCOUNTER — Other Ambulatory Visit: Payer: Self-pay

## 2019-12-31 ENCOUNTER — Encounter: Payer: Self-pay | Admitting: Cardiology

## 2019-12-31 ENCOUNTER — Ambulatory Visit (INDEPENDENT_AMBULATORY_CARE_PROVIDER_SITE_OTHER): Payer: Self-pay | Admitting: Family Medicine

## 2019-12-31 VITALS — BP 182/9 | HR 62 | Temp 98.2°F | Ht 64.0 in | Wt 251.0 lb

## 2019-12-31 DIAGNOSIS — R5383 Other fatigue: Secondary | ICD-10-CM

## 2019-12-31 DIAGNOSIS — M069 Rheumatoid arthritis, unspecified: Secondary | ICD-10-CM

## 2019-12-31 DIAGNOSIS — G43111 Migraine with aura, intractable, with status migrainosus: Secondary | ICD-10-CM

## 2019-12-31 DIAGNOSIS — I10 Essential (primary) hypertension: Secondary | ICD-10-CM

## 2019-12-31 DIAGNOSIS — R55 Syncope and collapse: Secondary | ICD-10-CM

## 2019-12-31 NOTE — Telephone Encounter (Signed)
Patient was contacted she is having virtual visit with Dr. Agustin Cree now.

## 2019-12-31 NOTE — Progress Notes (Signed)
Virtual Visit via Telephone Note   This visit type was conducted due to national recommendations for restrictions regarding the COVID-19 Pandemic (e.g. social distancing) in an effort to limit this patient's exposure and mitigate transmission in our community.  Due to her co-morbid illnesses, this patient is at least at moderate risk for complications without adequate follow up.  This format is felt to be most appropriate for this patient at this time.  The patient did not have access to video technology/had technical difficulties with video requiring transitioning to audio format only (telephone).  All issues noted in this document were discussed and addressed.  No physical exam could be performed with this format.  Please refer to the patient's chart for her  consent to telehealth for [facility].  Evaluation Performed:  Follow-up visit  This visit type was conducted due to national recommendations for restrictions regarding the COVID-19 Pandemic (e.g. social distancing).  This format is felt to be most appropriate for this patient at this time.  All issues noted in this document were discussed and addressed.  No physical exam was performed (except for noted visual exam findings with Video Visits).  Please refer to the patient's chart (MyChart message for video visits and phone note for telephone visits) for the patient's consent to telehealth for [facility].  Date:  12/31/2019  ID: Destiny Henderson, DOB Jun 26, 1961, MRN [id number]   Patient Location: [place] [address]   Provider location:   [place] Office  PCP:  Shelda Pal, DO  Cardiologist:  Jenne Campus, MD     Chief Complaint: I passed out  History of Present Illness:    Destiny Henderson is a 59 y.o. female  who presents via audio/video conferencing for a telehealth visit today.  With complex past medical history the biggest problem is essential hypertension  which seems to be difficult to control.  Also history of obstructive sleep apnea as well as diastolic congestive heart failure.  She does have televisit with me today.  Like always is somewhat difficult to get in touch with her family and we were able to contact her.  She described to have 2 episode of syncope.  One episode happened on Sunday when she was walking and then started feeling dizzy somewhat nauseated then she ended up going to the ground, she did not injure herself.  She is not sure if she completely passed out or not.  After that she was fine.  She did not show her blood pressure.  Few days before she had similar episode again the same, clinical scenario she was walking and then started feeling poorly did not sweat did not show shortness of breath but described to have some palpitations.  She felt her heart speeding up before episode as well as after.  She ended up not passing out that day completely. Otherwise complain of being tired exhausted.  No short of breath no leg pain no swelling more than usual.   The patient does not have symptoms concerning for COVID-19 infection (fever, chills, cough, or new SHORTNESS OF BREATH).    Prior CV studies:   The following studies were reviewed today:       Past Medical History:  Diagnosis Date  . Amenorrhea, secondary 05/12/2019   Probable menopause  >>  FSH  42.6  (+ menopause)  . Anemia   . Atypical chest pain 05/21/2018  . Benign tumor of bladder   .  Dizziness 11/24/2018  . DVT (deep venous thrombosis) ([place])   . Essential hypertension, benign 03/03/2013  . Fatigue 10/23/2019  . Fibroma of heart   . Hepatitis B immune 08/01/2019  . History of DVT (deep vein thrombosis) 02/06/2018  . History of iron deficiency anemia 05/21/2018  . IUD (intrauterine device) in place 06/03/2019   Located low in uterus 2 small fibroids  Note from 05/20/13: Needs IUD changed in 05/2014. (so was probably placed in 2010)  . LLQ pain 05/12/2019   US Showeed absent  right ovary Unable to see Left ovary Fibroid uterus (2 small ones) with IUD low in Uterus FSH showed pt is in menopause May take IUD out if desires  . Low back pain radiating to right leg 02/17/2018  . Menorrhagia 05/20/2013   Controlled with Mirena.  Probable submucosal fibroid.   . Mixed hyperlipidemia 05/21/2018  . Morbid obesity ([place]) 03/03/2013  . Obesity 03/03/2013  . OSA (obstructive sleep apnea) 11/23/2019  . Pedal edema 02/06/2018  . Rheumatoid arthritis ([place]) 10/23/2019  . Right knee injury, subsequent encounter 01/24/2018  . Right wrist injury, subsequent encounter 01/24/2018  . Sprain of MCL (medial collateral ligament) of knee 12/12/2016  . Sprain of right ankle 12/12/2016  . Systemic lupus erythematosus ([place]) 10/23/2019    Past Surgical History:  Procedure Laterality Date  . BACK SURGERY    . bladder tack    . DILATION AND CURETTAGE OF UTERUS     after SAB  . HEART TUMOR EXCISION       Current Meds  Medication Sig  . b complex vitamins capsule Take 1 capsule by mouth daily.  . Coenzyme Q10 (CO Q 10 PO) Take by mouth.  . diltiazem (CARDIZEM CD) 300 MG 24 hr capsule Take 1 capsule (300 mg total) by mouth daily.  . Echinacea 500 MG CAPS Take by mouth.  . fish oil-omega-3 fatty acids 1000 MG capsule Take 2 g by mouth daily.  . Flaxseed, Linseed, 1000 MG CAPS Take by mouth.  . hydrALAZINE (APRESOLINE) 50 MG tablet Take 1 tablet (50 mg total) by mouth in the morning and at bedtime.  Marland Kitchen ibuprofen (ADVIL) 800 MG tablet Take 1 tablet (800 mg total) by mouth every 8 (eight) hours as needed (pain).  . Melatonin 5 MG CAPS Take by mouth as needed.   . Multiple Vitamins-Minerals (MEGA MULTIVITAMIN FOR WOMEN) TABS Take by mouth.  . naproxen (NAPROSYN) 500 MG tablet Take 1 tablet (500 mg total) by mouth 2 (two) times daily as needed.  . nortriptyline (PAMELOR) 10 MG capsule Take 1 capsule (10 mg total) by mouth at bedtime.  Marland Kitchen orlistat (XENICAL) 120 MG capsule Take 1 capsule (120 mg total) by  mouth 3 (three) times daily with meals.  . potassium chloride (K-DUR) 10 MEQ tablet Take 1 tablet (10 mEq total) by mouth daily.  . Pyridoxine HCl (VITAMIN B-6) 500 MG tablet Take 500 mg by mouth daily.  Marland Kitchen telmisartan-hydrochlorothiazide (MICARDIS HCT) 80-25 MG tablet Take 1 tablet by mouth daily.  Marland Kitchen VITAMIN A OP Apply to eye.  . vitamin B-12 (CYANOCOBALAMIN) 1000 MCG tablet Take 1,000 mcg by mouth daily.  . vitamin C (ASCORBIC ACID) 500 MG tablet Take 500 mg by mouth daily.  Marland Kitchen VITAMIN D, CHOLECALCIFEROL, PO Take by mouth.      Family History: The patient's family history includes Breast cancer in her mother; Cancer in her father, maternal aunt, maternal grandfather, and another family member; Colon cancer in her father; Diabetes  in her maternal uncle; Heart failure in her maternal aunt and maternal grandmother; Hyperlipidemia in her mother; Hypertension in her maternal aunt, maternal aunt, maternal uncle, and mother; Thyroid disease in her sister and sister.   ROS:   Please see the history of present illness.     All other systems reviewed and are negative.   Labs/Other Tests and Data Reviewed:     Recent Labs: 07/30/2019: Hemoglobin 11.5; Platelets 371.0; Pro B Natriuretic peptide (BNP) 28.0 08/07/2019: ALT 12; BUN 12; Creatinine, Ser 1.00; Potassium 3.8; Sodium 145 10/23/2019: TSH 0.85  Recent Lipid Panel    Component Value Date/Time   CHOL 201 (H) 08/07/2019 1422   TRIG 112.0 08/07/2019 1422   HDL 45.20 08/07/2019 1422   CHOLHDL 4 08/07/2019 1422   VLDL 22.4 08/07/2019 1422   LDLCALC 133 (H) 08/07/2019 1422      Exam:    Vital Signs:  BP (!) 182/9   Pulse 62   Temp 98.2 F (36.8 C)   Ht 5\' 4"  (1.626 m)   Wt 251 lb (113.9 kg)   BMI 43.08 kg/m     Wt Readings from Last 3 Encounters:  12/31/19 251 lb (113.9 kg)  12/10/19 240 lb (108.9 kg)  11/25/19 240 lb (108.9 kg)     Well nourished, well developed in no acute distress. Alert awake in exam 3 talking to me  over the phone, unable to establish video link.  Not in any distress at the time of my interview  Diagnosis for this visit:   1. Syncope and collapse   2. Essential hypertension, benign   3. Rheumatoid arthritis, involving unspecified site, unspecified whether rheumatoid factor present ([place])   4. Fatigue, unspecified type      ASSESSMENT & PLAN:    1.  Syncope and collapse obviously very concerning.  It is difficult to entertain at what stage of this phenomenon is based on the story.  The fact that she has some palpitations before and after. The plan will be to put a Zio patch monitor on her to see if she gets any significant arrhythmia.  I will also bring her to the office to check her orthostatic changes.  I am worried that the addition of hydralazine make create significant orthostatic hypotension.  I will also ask her to reduce the dose of hydralazine from 50 mg twice daily to 25 mg 3 times a day. 2.  Essential hypertension is always difficult to control her blood pressure.  She reports blood pressure today 180/90.  I asked her to have pheochromocytoma work-up. 3.  Fatigue difficult symptoms multifactorial.  Sleep apnea may play some role here.  Overall is very difficult to evaluate her over the phone.  I will bring her to the office within next few days so I can evaluate her physically in the office.  COVID-19 Education: The signs and symptoms of COVID-19 were discussed with the patient and how to seek care for testing (follow up with PCP or arrange E-visit).  The importance of social distancing was discussed today.  Patient Risk:   After full review of this patients clinical status, I feel that they are at least moderate risk at this time.  Time:   Today, I have spent 5 minutes with the patient with telehealth technology discussing pt health issues.  I spent 22 minutes reviewing her chart before the visit.  Visit was finished at 11 AM.    Medication Adjustments/Labs and Tests  Ordered: Current medicines are reviewed  at length with the patient today.  Concerns regarding medicines are outlined above.  Orders Placed This Encounter  Procedures  . Metanephrines, Urine, 24 hour  . Basic metabolic panel  . LONG TERM MONITOR (3-14 DAYS)   Medication changes: No orders of the defined types were placed in this encounter.    Disposition: Within next few days  Signed, Park Liter, MD, [facility] 12/31/2019 11:35 AM    [place]

## 2019-12-31 NOTE — Telephone Encounter (Signed)
Attempted to call patient to start her virtual visit with Dr. Agustin Cree.  No answer and ringing busy.

## 2019-12-31 NOTE — Patient Instructions (Signed)
Medication Instructions:  Your physician recommends that you continue on your current medications as directed. Please refer to the Current Medication list given to you today.  *If you need a refill on your cardiac medications before your next appointment, please call your pharmacy*   Lab Work: Your physician recommends that you return for lab work within 1 week: bmp, metanephrine urine test  If you have labs (blood work) drawn today and your tests are completely normal, you will receive your results only by: Marland Kitchen MyChart Message (if you have MyChart) OR . A paper copy in the mail If you have any lab test that is abnormal or we need to change your treatment, we will call you to review the results.   Testing/Procedures:  A zio monitor was ordered today. It will remain on for 7 days. You will then return monitor and event diary in provided box. It takes 1-2 weeks for report to be downloaded and returned to [name]. We will call you with the results. If monitor falls off or has orange flashing light, please call Zio for further instructions.      Follow-Up: At [facility], you and your health needs are our priority.  As part of our continuing mission to provide you with exceptional heart care, we have created designated Provider Care Teams.  These Care Teams include your primary Cardiologist (physician) and Advanced Practice Providers (APPs -  Physician Assistants and Nurse Practitioners) who all work together to provide you with the care you need, when you need it.  We recommend signing up for the patient portal called "MyChart".  Sign up information is provided on this After Visit Summary.  MyChart is used to connect with patients for Virtual Visits (Telemedicine).  Patients are able to view lab/test results, encounter notes, upcoming appointments, etc.  Non-urgent messages can be sent to your provider as well.   To learn more about what you can do with MyChart, go to [email or web address].     Your next appointment:   1 month(s)  The format for your next appointment:   In Person  Provider:   Jenne Campus, MD   Other Instructions

## 2019-12-31 NOTE — Telephone Encounter (Signed)
New message:     Patient calling stating that she missed a call. Please call patient back.

## 2020-01-01 ENCOUNTER — Telehealth: Payer: Self-pay | Admitting: Neurology

## 2020-01-01 ENCOUNTER — Telehealth: Payer: Self-pay | Admitting: Family Medicine

## 2020-01-01 NOTE — Telephone Encounter (Signed)
Patient left vm trying to get results for EMG and MRI. Please call her back. Thanks!

## 2020-01-01 NOTE — Telephone Encounter (Signed)
Called the patient and she is needing results back from Sleep Study (12/01/2019), MRI yesterday 12/31/19) and Nerve Study (12/23/2019) she had done. She has not received any of these results yet

## 2020-01-01 NOTE — Telephone Encounter (Signed)
Patient aware of test results.

## 2020-01-01 NOTE — Telephone Encounter (Signed)
I recommend she contact the respective providers who ordered the tests (Dr. Claiborne Billings for sleep study, etc).

## 2020-01-01 NOTE — Telephone Encounter (Signed)
Called informed the patient of PCP response to request. She verbalized understanding.

## 2020-01-01 NOTE — Telephone Encounter (Signed)
Caller : Abigail Teall  Call Back # [phone]  Patient is requesting test results   Please advise

## 2020-01-01 NOTE — Telephone Encounter (Signed)
Please inform patient that MRI brain and EMG is normal.

## 2020-01-04 ENCOUNTER — Other Ambulatory Visit: Payer: Self-pay | Admitting: Family Medicine

## 2020-01-04 MED ORDER — VITAMIN D (ERGOCALCIFEROL) 1.25 MG (50000 UNIT) PO CAPS: 50000.0000 [IU] | ORAL_CAPSULE | ORAL | 0 refills | Status: DC

## 2020-01-06 ENCOUNTER — Telehealth: Payer: Self-pay | Admitting: *Deleted

## 2020-01-06 ENCOUNTER — Other Ambulatory Visit: Payer: Self-pay | Admitting: Cardiovascular Disease

## 2020-01-06 DIAGNOSIS — G4733 Obstructive sleep apnea (adult) (pediatric): Secondary | ICD-10-CM

## 2020-01-06 MED FILL — VIT D2 1.25 MG (50,000 UNIT: 1.25 MG | 28 days supply | Qty: 4 | Fill #0

## 2020-01-06 MED FILL — NITROFURANTOIN MONO-MCR 100: 100 | 5 days supply | Qty: 10 | Fill #0

## 2020-01-06 NOTE — Telephone Encounter (Signed)
Spoke with patient to discuss her HST results and recommendations. She did not receive the [employer]. States she cannot get into it. She has agreed to having CPAP titration study versus getting oral appliance.

## 2020-01-07 LAB — BASIC METABOLIC PANEL
BUN/Creatinine Ratio: 12 (ref 9–23)
BUN: 11 mg/dL (ref 6–24)
CO2: 25 mmol/L (ref 20–29)
Calcium: 9 mg/dL (ref 8.7–10.2)
Chloride: 104 mmol/L (ref 96–106)
Creatinine, Ser: 0.94 mg/dL (ref 0.57–1.00)
GFR calc Af Amer: 78 mL/min/{1.73_m2} (ref >59)
GFR calc non Af Amer: 68 mL/min/{1.73_m2} (ref >59)
Glucose: 109 mg/dL — ABNORMAL HIGH (ref 65–99)
Potassium: 4.5 mmol/L (ref 3.5–5.2)
Sodium: 142 mmol/L (ref 134–144)

## 2020-01-10 ENCOUNTER — Ambulatory Visit (INDEPENDENT_AMBULATORY_CARE_PROVIDER_SITE_OTHER): Payer: PRIVATE HEALTH INSURANCE

## 2020-01-10 DIAGNOSIS — I10 Essential (primary) hypertension: Secondary | ICD-10-CM | POA: Diagnosis not present

## 2020-01-10 DIAGNOSIS — R55 Syncope and collapse: Secondary | ICD-10-CM | POA: Diagnosis not present

## 2020-01-14 ENCOUNTER — Ambulatory Visit: Payer: PRIVATE HEALTH INSURANCE | Admitting: Family Medicine

## 2020-01-25 ENCOUNTER — Telehealth: Payer: Self-pay

## 2020-01-25 NOTE — Telephone Encounter (Signed)
Called the patient left message to call back 

## 2020-01-25 NOTE — Telephone Encounter (Signed)
Patient called in to see if [name] send her Lab order to a Lab some where in [place]. Please follow up with the patient and advise at [phone]

## 2020-01-26 NOTE — Telephone Encounter (Signed)
Called left message to call back 

## 2020-01-27 NOTE — Telephone Encounter (Signed)
Called left message to call back 

## 2020-01-28 ENCOUNTER — Other Ambulatory Visit: Payer: PRIVATE HEALTH INSURANCE

## 2020-01-29 NOTE — Telephone Encounter (Signed)
Called left message to call back 

## 2020-02-01 NOTE — Telephone Encounter (Signed)
Called left message to call back 

## 2020-02-02 NOTE — Telephone Encounter (Signed)
Unable to get the patient on the phone/will not return calls

## 2020-02-03 ENCOUNTER — Telehealth: Payer: Self-pay | Admitting: Cardiovascular Disease

## 2020-02-03 NOTE — Telephone Encounter (Signed)
New Message   Patient is calling because she spoke with Mariann Laster a few weeks ago in reference to a CPAP titration. Patient is following up in reference to this study. Please call to discuss.

## 2020-02-08 NOTE — Telephone Encounter (Signed)
Awaiting [employer] call back with portal access approval.  Registration completed.

## 2020-02-08 NOTE — Telephone Encounter (Signed)
-----   Message from Lauralee Evener, [place] sent at 01/06/2020  8:43 AM EDT ----- Titration study

## 2020-02-15 ENCOUNTER — Encounter: Payer: Self-pay | Admitting: Cardiology

## 2020-02-15 ENCOUNTER — Telehealth: Payer: Self-pay | Admitting: *Deleted

## 2020-02-15 ENCOUNTER — Telehealth: Payer: Self-pay | Admitting: Cardiology

## 2020-02-15 ENCOUNTER — Telehealth (INDEPENDENT_AMBULATORY_CARE_PROVIDER_SITE_OTHER): Payer: PRIVATE HEALTH INSURANCE | Admitting: Cardiology

## 2020-02-15 VITALS — BP 172/94 | HR 62 | Ht 64.0 in

## 2020-02-15 DIAGNOSIS — G4733 Obstructive sleep apnea (adult) (pediatric): Secondary | ICD-10-CM

## 2020-02-15 DIAGNOSIS — I10 Essential (primary) hypertension: Secondary | ICD-10-CM | POA: Diagnosis not present

## 2020-02-15 DIAGNOSIS — M069 Rheumatoid arthritis, unspecified: Secondary | ICD-10-CM

## 2020-02-15 DIAGNOSIS — R55 Syncope and collapse: Secondary | ICD-10-CM

## 2020-02-15 NOTE — Telephone Encounter (Signed)
Follow Up  Patient is returning a missed call. Please give patient a call back.

## 2020-02-15 NOTE — Telephone Encounter (Signed)
[  1:25 PM] Destiny Henderson     No auth required.Marland KitchenMarland Kitchen

## 2020-02-15 NOTE — Patient Instructions (Signed)

## 2020-02-15 NOTE — Telephone Encounter (Signed)
Patient is scheduled for lab study on 03/03/20. pt is scheduled for COVID screening on 03/01/20 11:45 prior to titration.  Patient understands her sleep study will be done at [facility] sleep lab. Patient understands she will receive a sleep packet in a week or so. Patient understands to call if she does not receive the sleep packet in a timely manner. Patient agrees with treatment and thanked me for call Left detailed message on voicemail with date and time of titration and informed patient to call back to confirm or reschedule.

## 2020-02-15 NOTE — Progress Notes (Signed)
Virtual Visit via Telephone Note   This visit type was conducted due to national recommendations for restrictions regarding the COVID-19 Pandemic (e.g. social distancing) in an effort to limit this patient's exposure and mitigate transmission in our community.  Due to her co-morbid illnesses, this patient is at least at moderate risk for complications without adequate follow up.  This format is felt to be most appropriate for this patient at this time.  The patient did not have access to video technology/had technical difficulties with video requiring transitioning to audio format only (telephone).  All issues noted in this document were discussed and addressed.  No physical exam could be performed with this format.  Please refer to the patient's chart for her  consent to telehealth for [facility].  Evaluation Performed:  Follow-up visit  This visit type was conducted due to national recommendations for restrictions regarding the COVID-19 Pandemic (e.g. social distancing).  This format is felt to be most appropriate for this patient at this time.  All issues noted in this document were discussed and addressed.  No physical exam was performed (except for noted visual exam findings with Video Visits).  Please refer to the patient's chart (MyChart message for video visits and phone note for telephone visits) for the patient's consent to telehealth for [facility].  Date:  02/15/2020  ID: Destiny Henderson, DOB 1961-02-02, MRN [id number]   Patient Location: [place] [id number]   Provider location:   [place] Office  PCP:  Shelda Pal, DO  Cardiologist:  Jenne Campus, MD     Chief Complaint: My blood pressure still elevated  History of Present Illness:    Destiny Henderson is a 59 y.o. female  who presents via audio/video conferencing for a telehealth visit today.  With complex past medical history.  That include essential  hypertension which is difficult to manage, recently recognized sleep apnea.  Also episodes of dizziness and passing out.  She had quite extensive evaluation done so far unrevealing.  Still struggling with blood pressure.  She had physical visit with [name] today but then she calls back requesting virtual visit.  She was unable to establish video link therefore I talked to her over the phone.  She denies have any chest pain tightness squeezing pressure burning chest but complain of having shortness of breath and fatigue when walk.  Still have difficulty controlling her blood pressure her blood pressure usually between 1 97-0 70 systolic.  We talked about potential way to improve with I want her to go up with hydralazine however she does not want to do it she is afraid to do it.  She was recently diagnosed with obstructive sleep apnea and titration study is being scheduled however there is still some issue with approval from insurance company.  I told her that in my opinion would be very beneficial to have sleep apnea managed if you truly does have significant sleep apnea.   The patient does not have symptoms concerning for COVID-19 infection (fever, chills, cough, or new SHORTNESS OF BREATH).    Prior CV studies:   The following studies were reviewed today:       Past Medical History:  Diagnosis Date  . Amenorrhea, secondary 05/12/2019   Probable menopause  >>  FSH  42.6  (+ menopause)  . Anemia   . Atypical chest pain 05/21/2018  . Benign tumor of bladder   . Dizziness 11/24/2018  .  DVT (deep venous thrombosis) ([place])   . Essential hypertension, benign 03/03/2013  . Fatigue 10/23/2019  . Fibroma of heart   . Hepatitis B immune 08/01/2019  . History of DVT (deep vein thrombosis) 02/06/2018  . History of iron deficiency anemia 05/21/2018  . IUD (intrauterine device) in place 06/03/2019   Located low in uterus 2 small fibroids  Note from 05/20/13: Needs IUD changed in 05/2014. (so was probably placed in  2010)  . LLQ pain 05/12/2019   US Showeed absent right ovary Unable to see Left ovary Fibroid uterus (2 small ones) with IUD low in Uterus FSH showed pt is in menopause May take IUD out if desires  . Low back pain radiating to right leg 02/17/2018  . Menorrhagia 05/20/2013   Controlled with Mirena.  Probable submucosal fibroid.   . Mixed hyperlipidemia 05/21/2018  . Morbid obesity ([place]) 03/03/2013  . Obesity 03/03/2013  . OSA (obstructive sleep apnea) 11/23/2019  . Pedal edema 02/06/2018  . Rheumatoid arthritis ([place]) 10/23/2019  . Right knee injury, subsequent encounter 01/24/2018  . Right wrist injury, subsequent encounter 01/24/2018  . Sprain of MCL (medial collateral ligament) of knee 12/12/2016  . Sprain of right ankle 12/12/2016  . Systemic lupus erythematosus ([place]) 10/23/2019    Past Surgical History:  Procedure Laterality Date  . BACK SURGERY    . bladder tack    . DILATION AND CURETTAGE OF UTERUS     after SAB  . HEART TUMOR EXCISION       Current Meds  Medication Sig  . b complex vitamins capsule Take 1 capsule by mouth daily.  . Coenzyme Q10 (CO Q 10 PO) Take by mouth.  . diltiazem (CARDIZEM CD) 300 MG 24 hr capsule Take 1 capsule (300 mg total) by mouth daily.  . Echinacea 500 MG CAPS Take by mouth.  . fish oil-omega-3 fatty acids 1000 MG capsule Take 2 g by mouth daily.  . Flaxseed, Linseed, 1000 MG CAPS Take by mouth.  . furosemide (LASIX) 40 MG tablet Take 1 tablet (40 mg total) by mouth daily.  . hydrALAZINE (APRESOLINE) 50 MG tablet Take 1 tablet (50 mg total) by mouth in the morning and at bedtime.  Marland Kitchen ibuprofen (ADVIL) 800 MG tablet Take 1 tablet (800 mg total) by mouth every 8 (eight) hours as needed (pain).  . Melatonin 5 MG CAPS Take by mouth as needed.   . Multiple Vitamins-Minerals (MEGA MULTIVITAMIN FOR WOMEN) TABS Take by mouth.  . naproxen (NAPROSYN) 500 MG tablet Take 1 tablet (500 mg total) by mouth 2 (two) times daily as needed.  . nortriptyline (PAMELOR) 10 MG  capsule Take 1 capsule (10 mg total) by mouth at bedtime.  Marland Kitchen orlistat (XENICAL) 120 MG capsule Take 1 capsule (120 mg total) by mouth 3 (three) times daily with meals.  . potassium chloride (K-DUR) 10 MEQ tablet Take 1 tablet (10 mEq total) by mouth daily.  . Pyridoxine HCl (VITAMIN B-6) 500 MG tablet Take 500 mg by mouth daily.  Marland Kitchen spironolactone (ALDACTONE) 25 MG tablet Take 0.5 tablets (12.5 mg total) by mouth daily.  Marland Kitchen telmisartan-hydrochlorothiazide (MICARDIS HCT) 80-25 MG tablet Take 1 tablet by mouth daily.  Marland Kitchen VITAMIN A OP Apply to eye.  . vitamin B-12 (CYANOCOBALAMIN) 1000 MCG tablet Take 1,000 mcg by mouth daily.  . vitamin C (ASCORBIC ACID) 500 MG tablet Take 500 mg by mouth daily.  Marland Kitchen VITAMIN D, CHOLECALCIFEROL, PO Take by mouth.  . Vitamin D, Ergocalciferol, (DRISDOL) 1.25  MG (50000 UNIT) CAPS capsule Take 1 capsule (50,000 Units total) by mouth every 7 (seven) days.      Family History: The patient's family history includes Breast cancer in her mother; Cancer in her father, maternal aunt, maternal grandfather, and another family member; Colon cancer in her father; Diabetes in her maternal uncle; Heart failure in her maternal aunt and maternal grandmother; Hyperlipidemia in her mother; Hypertension in her maternal aunt, maternal aunt, maternal uncle, and mother; Thyroid disease in her sister and sister.   ROS:   Please see the history of present illness.     All other systems reviewed and are negative.   Labs/Other Tests and Data Reviewed:     Recent Labs: 07/30/2019: Hemoglobin 11.5; Platelets 371.0; Pro B Natriuretic peptide (BNP) 28.0 08/07/2019: ALT 12 10/23/2019: TSH 0.85 01/06/2020: BUN 11; Creatinine, Ser 0.94; Potassium 4.5; Sodium 142  Recent Lipid Panel    Component Value Date/Time   CHOL 201 (H) 08/07/2019 1422   TRIG 112.0 08/07/2019 1422   HDL 45.20 08/07/2019 1422   CHOLHDL 4 08/07/2019 1422   VLDL 22.4 08/07/2019 1422   LDLCALC 133 (H) 08/07/2019 1422       Exam:    Vital Signs:  BP (!) 172/94   Pulse 62   Ht 5\' 4"  (1.626 m)   BMI 43.08 kg/m     Wt Readings from Last 3 Encounters:  12/31/19 251 lb (113.9 kg)  12/10/19 240 lb (108.9 kg)  11/25/19 240 lb (108.9 kg)     Well nourished, well developed in no acute distress. Alert awake in exam 3.  Somewhat unhappy on the phone.  Denies have any symptoms at the time of my interview.  Diagnosis for this visit:   1. Essential hypertension, benign   2. Syncope and collapse   3. OSA (obstructive sleep apnea)   4. Rheumatoid arthritis, involving unspecified site, unspecified whether rheumatoid factor present ([place])      ASSESSMENT & PLAN:    1.  Essential hypertension very difficult to controlled with try different medication with limited success.  I have a hope that area with proper management of his sleep apnea blood pressure will be better without addition of any medications.  Obviously now we waiting for sleep study to be done. 2.  Syncope and collapse: I did review her monitor which showed only APCs and PVCs, multiple trigger events more than 30 majority of time for sinus rhythm sometimes APCs sometimes PVCs noted. 4.  Obstructive sleep apnea recent discovery.  Awaiting approval for a titration study. 5.  Rheumatoid arthritis followed by internal medicine team.  Overall difficult situation I hope that the proper management of sleep apnea will help with the situation.  COVID-19 Education: The signs and symptoms of COVID-19 were discussed with the patient and how to seek care for testing (follow up with PCP or arrange E-visit).  The importance of social distancing was discussed today.  Patient Risk:   After full review of this patients clinical status, I feel that they are at least moderate risk at this time.  Time:   Today, I have spent 10 minutes with the patient with telehealth technology discussing pt health issues.  I spent 15 minutes reviewing her chart before the visit.   Visit was finished at 1153.    Medication Adjustments/Labs and Tests Ordered: Current medicines are reviewed at length with the patient today.  Concerns regarding medicines are outlined above.  No orders of the defined types were placed in  this encounter.  Medication changes: No orders of the defined types were placed in this encounter.    Disposition: Follow-up in 3 months  Signed, Park Liter, MD, [facility] 02/15/2020 11:50 AM    [place]

## 2020-02-15 NOTE — Telephone Encounter (Signed)
Destiny Henderson is calling requesting a nurse give her a call in regards to her monitor. She also changed her appointment scheduled for today 02/15/20 at 11:00 am to virtual due to being in [place]. Please advise.

## 2020-02-15 NOTE — Telephone Encounter (Signed)
Called patient. At first she asked me why I was calling and that she didn't want to talk to me. I advised her that that I was Dr. Wendy Poet nurse and she had called in stating she wanted a nurse to call her back. She then proceeded to ask about her monitor results. I informed her they are back however Dr. Agustin Cree hasn't reviewed them yet and will mostly likely review before her virtual visit today and discuss then. Confirmed her virtual appointment with her. No further questions.

## 2020-02-26 IMAGING — DX RIGHT KNEE - COMPLETE 4+ VIEW
4 series · 4 of 4 positions shown · non-contrast
Comparison: 01/21/2018

CLINICAL DATA: Fall

EXAM:
RIGHT KNEE - COMPLETE 4+ VIEW

[knee ap]
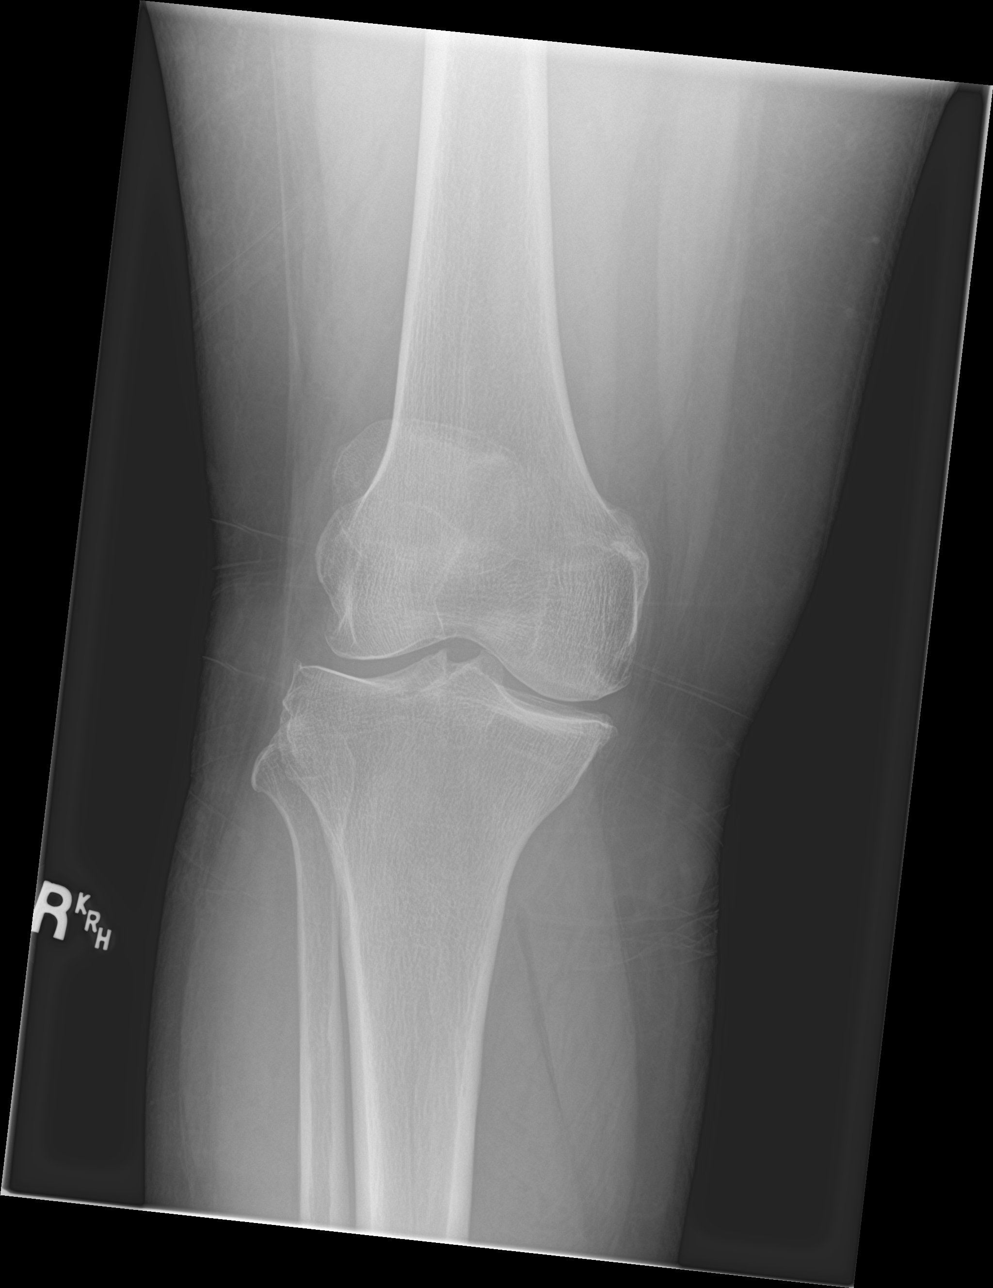

[knee lat]
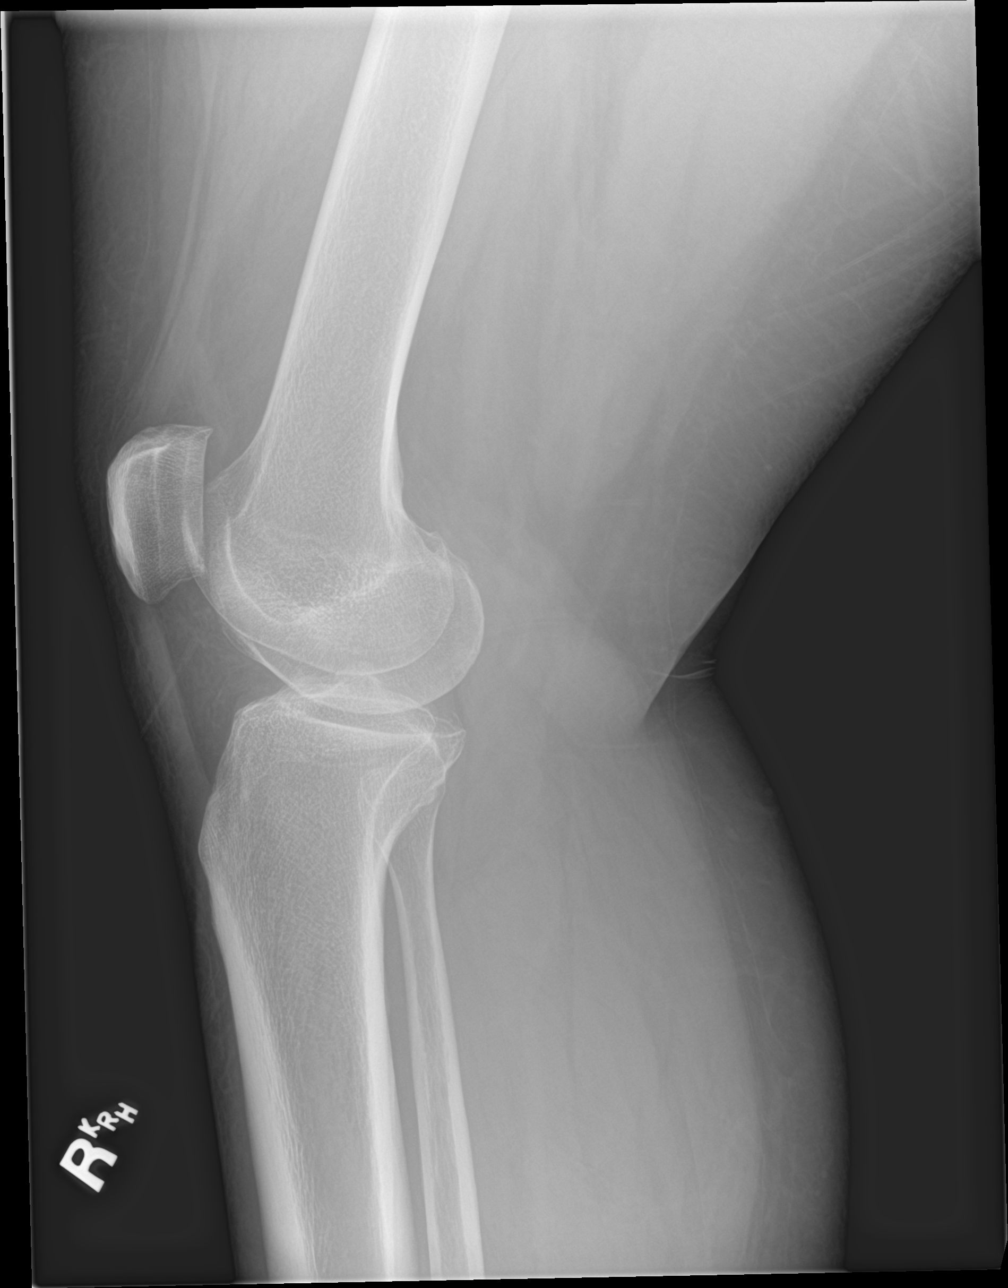

[knee obl (1 of 2)]
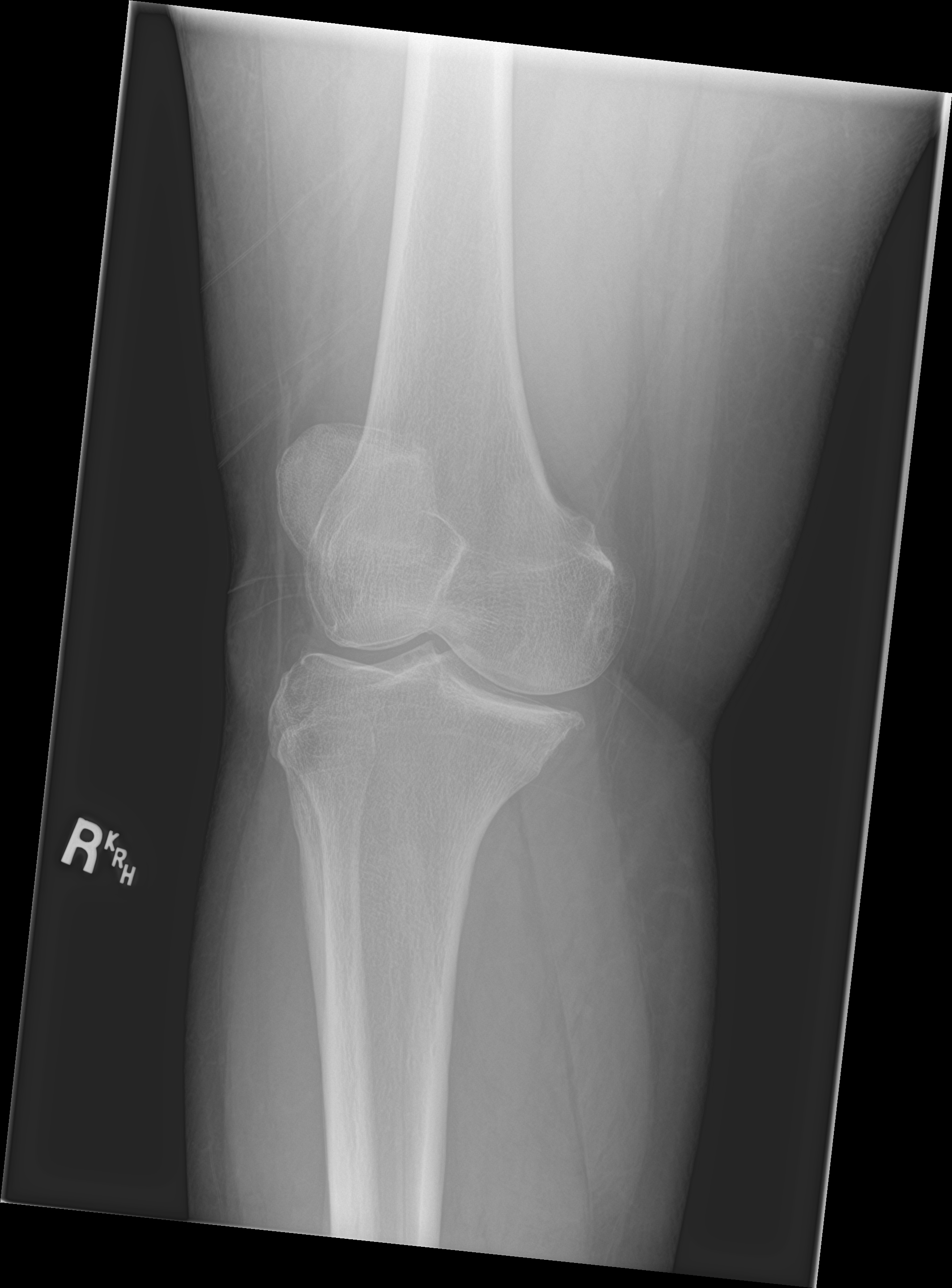

[knee obl (2 of 2)]
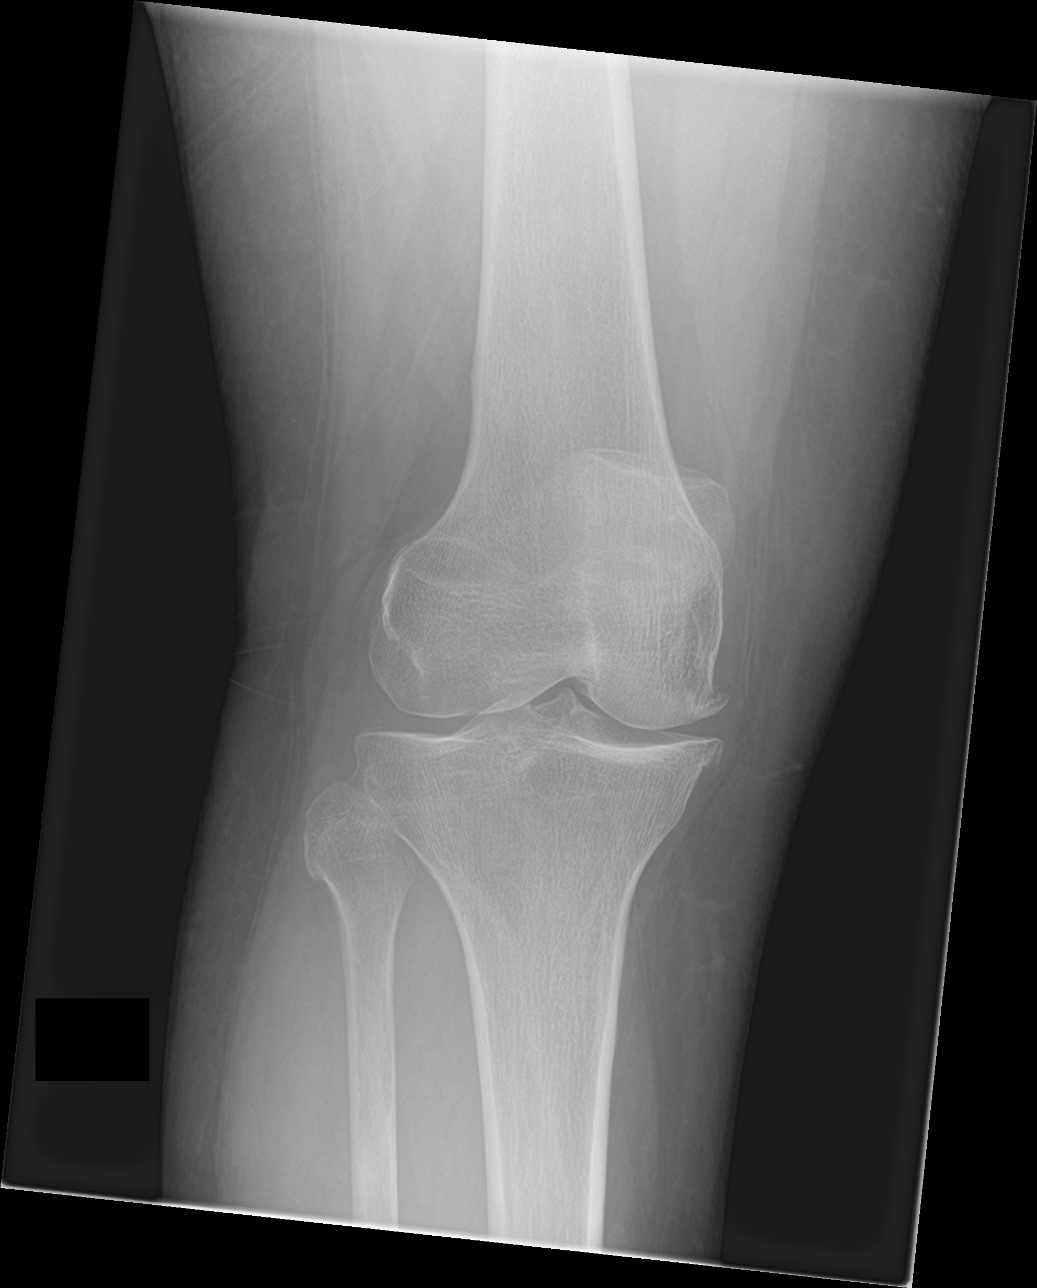

[4 of 4 positions shown; findings below may reference images not displayed]

FINDINGS: No evidence of fracture, dislocation, or joint effusion. No evidence
of arthropathy or other focal bone abnormality. Soft tissues are
unremarkable.
IMPRESSION: Negative.

## 2020-03-01 ENCOUNTER — Other Ambulatory Visit (HOSPITAL_COMMUNITY)
Admission: RE | Admit: 2020-03-01 | Discharge: 2020-03-01 | Disposition: A | Discharge status: Home / Self Care | Payer: PRIVATE HEALTH INSURANCE | Source: Ambulatory Visit | Attending: Cardiovascular Disease | Admitting: Cardiovascular Disease

## 2020-03-01 DIAGNOSIS — Z20822 Contact with and (suspected) exposure to covid-19: Secondary | ICD-10-CM | POA: Diagnosis not present

## 2020-03-01 DIAGNOSIS — Z01812 Encounter for preprocedural laboratory examination: Secondary | ICD-10-CM | POA: Diagnosis not present

## 2020-03-01 LAB — SARS CORONAVIRUS 2 (TAT 6-24 HRS): SARS Coronavirus 2: NEGATIVE

## 2020-03-03 ENCOUNTER — Ambulatory Visit (HOSPITAL_BASED_OUTPATIENT_CLINIC_OR_DEPARTMENT_OTHER): Payer: PRIVATE HEALTH INSURANCE | Attending: Cardiovascular Disease | Admitting: Cardiovascular Disease

## 2020-03-03 ENCOUNTER — Other Ambulatory Visit: Payer: Self-pay

## 2020-03-03 DIAGNOSIS — F4489 Other dissociative and conversion disorders: Secondary | ICD-10-CM | POA: Diagnosis not present

## 2020-03-03 DIAGNOSIS — G4733 Obstructive sleep apnea (adult) (pediatric): Secondary | ICD-10-CM

## 2020-03-03 DIAGNOSIS — R0683 Snoring: Secondary | ICD-10-CM | POA: Diagnosis not present

## 2020-03-03 DIAGNOSIS — M1990 Unspecified osteoarthritis, unspecified site: Secondary | ICD-10-CM | POA: Diagnosis not present

## 2020-03-03 DIAGNOSIS — R5383 Other fatigue: Secondary | ICD-10-CM | POA: Diagnosis not present

## 2020-03-12 NOTE — Telephone Encounter (Signed)
[  1:25 PM] Destiny Henderson     No auth required...  Patient is scheduled for lab study on 03/03/20. pt is scheduled for COVID screening on 03/01/20 11:45 prior to titration.

## 2020-03-14 ENCOUNTER — Telehealth: Payer: Self-pay | Admitting: Family Medicine

## 2020-03-14 NOTE — Telephone Encounter (Signed)
Called left message to call back 

## 2020-03-14 NOTE — Telephone Encounter (Signed)
The patient returned call and did inform her per PCP instructions she would need to contact MD that did the sleep study.  The patient also requested a lab order for Vitamin D  mailed to her home address/ in [place] to take to [place] to have done while in that area for work.The patient informed she just had labs done (last month) in [place] that included Vitamin D. She was requesting her vitamin D prescription be refilled and she was informed PCP would need the most recent lab results before refill could be done. The patient stated she would have them faxed to our office. The patient verbalized understanding to call the location where sleep study was done to get those results. Also verbalized to have most recent labs faxed to our office for PCP to review before refilling Vitamin D.

## 2020-03-14 NOTE — Telephone Encounter (Signed)
Please have her contact her sleep specialist. Ty.

## 2020-03-14 NOTE — Telephone Encounter (Signed)
Patient is requesting a call back in regards to sleep study results .   Please advise

## 2020-03-14 NOTE — Telephone Encounter (Signed)
Also updated medication list and added ozempic (pullled from reconciled meds.).

## 2020-03-19 NOTE — Procedures (Signed)
Patient Name: Destiny Henderson, Destiny Henderson Date: 03/03/2020 Gender: Female D.O.B: [birth year] Age (years): 25 Referring Provider: Loel Dubonnet NP Height (inches): 64 Interpreting Physician: Shelva Majestic MD, ABSM Weight (lbs): 241 RPSGT: Jorge Ny BMI: 41 MRN: [id number] Neck Size: 16.00  CLINICAL INFORMATION The patient is referred for a CPAP titration to treat sleep apnea.  Epworth Sleepiness Scale: 20  Date of  HST: 11/19/2019:  AHI 7.3/h; O2 nadir 89%.  SLEEP STUDY TECHNIQUE As per the AASM Manual for the Scoring of Sleep and Associated Events v2.3 (April 2016) with a hypopnea requiring 4% desaturations.  The channels recorded and monitored were frontal, central and occipital EEG, electrooculogram (EOG), submentalis EMG (chin), nasal and oral airflow, thoracic and abdominal wall motion, anterior tibialis EMG, snore microphone, electrocardiogram, and pulse oximetry. Continuous positive airway pressure (CPAP) was initiated at the beginning of the study and titrated to treat sleep-disordered breathing.  MEDICATIONS b complex vitamins capsule Coenzyme Q10 (CO Q 10 PO) diltiazem (CARDIZEM CD) 300 MG 24 hr capsule Echinacea 500 MG CAPS fish oil-omega-3 fatty acids 1000 MG capsule Flaxseed, Linseed, 1000 MG CAPS furosemide (LASIX) 40 MG tablet(Expired) hydrALAZINE (APRESOLINE) 50 MG tablet(Expired) ibuprofen (ADVIL) 800 MG tablet Melatonin 5 MG CAPS Multiple Vitamins-Minerals (MEGA MULTIVITAMIN FOR WOMEN) TABS naproxen (NAPROSYN) 500 MG tablet nortriptyline (PAMELOR) 10 MG capsule orlistat (XENICAL) 120 MG capsule OZEMPIC, 1 MG/DOSE, 4 MG/3ML SOPN potassium chloride (K-DUR) 10 MEQ tablet Pyridoxine HCl (VITAMIN B-6) 500 MG tablet spironolactone (ALDACTONE) 25 MG tablet(Expired) telmisartan-hydrochlorothiazide (MICARDIS HCT) 80-25 MG tablet VITAMIN A OP vitamin B-12 (CYANOCOBALAMIN) 1000 MCG tablet vitamin C (ASCORBIC ACID) 500 MG tablet VITAMIN D,  CHOLECALCIFEROL, PO Vitamin D, Ergocalciferol, (DRISDOL) 1.25 MG (50000 UNIT) CAPS capsule Medications self-administered by patient taken the night of the study : N/A  TECHNICIAN COMMENTS Comments added by technician: NONE Comments added by scorer: N/A  RESPIRATORY PARAMETERS Optimal PAP Pressure (cm): 18 AHI at Optimal Pressure (/hr): 0.0 Overall Minimal O2 (%): 86.0 Supine % at Optimal Pressure (%): 0 Minimal O2 at Optimal Pressure (%): 95.0   SLEEP ARCHITECTURE The study was initiated at 10:26:48 PM and ended at 6:03:14 AM.  Sleep onset time was 2.9 minutes and the sleep efficiency was 92.0%%. The total sleep time was 420 minutes.  The patient spent 4.5%% of the night in stage N1 sleep, 63.5%% in stage N2 sleep, 0.0%% in stage N3 and 32% in REM.Stage REM latency was 52.0 minutes  Wake after sleep onset was 33.5. Alpha intrusion was absent. Supine sleep was 71.19%.  CARDIAC DATA The 2 lead EKG demonstrated sinus rhythm. The mean heart rate was 64.8 beats per minute. Other EKG findings include: None.   LEG MOVEMENT DATA The total Periodic Limb Movements of Sleep (PLMS) were 0. The PLMS index was 0.0. A PLMS index of <15 is considered normal in adults.  IMPRESSIONS - CPAP was initiated at 6 cm and was titrated to optimal PAP pressure at 18 cm of water; AHI 0; O2 nadir 95%. REM sleep was achieved for 29.5 minutes at 18 cm pressue. Although AHI's were excellent at pressures 9 and above, snoring persisted and ultimately resolved at 18 cm. If patient has difficulty with high pressure, consider initial trial of Auto CPAP 11-20 cm of water. - Central sleep apnea was not noted during this titration (CAI = 0.0/h). - Moderate oxygen desaturations were observed during this titration to a  nadir of 86.0% at 7 cm of water. - The patient snored with moderate snoring volume  during this titration study. - No cardiac abnormalities were observed during this study. - Clinically significant periodic  limb movements were not noted during this study. Arousals associated with PLMs were rare.  DIAGNOSIS - Obstructive Sleep Apnea (327.23 [G47.33 ICD-10])  RECOMMENDATIONS - Trial of CPAP therapy with EPR of 3 at 18 cm H2O with heated humidification. If patient has difficulty with high pressure, consider initial trial of Auto CPAP 11-20 cm of water.  A Medium Cushion size [employer]ics Minimal Museum/gallery curator Under Nose Frame (M) mask was used for the titration. - Effort should be made to optimize nasal and oropharyngeal patency. - Avoid alcohol, sedatives and other CNS depressants that may worsen sleep apnea and disrupt normal sleep architecture. - Sleep hygiene should be reviewed to assess factors that may improve sleep quality. - Weight management and regular exercise should be initiated or continued. - Recommend a download in 30 days and sleep clinic evaluation after 4 weeks of therapy   [Electronically signed] 03/19/2020 12:55 PM  Shelva Majestic MD, [facility], ABSM Diplomate, American Board of Sleep Medicine   NPI: [id number] [place] PH: [phone]   FX: [phone] [place]

## 2020-03-21 NOTE — Progress Notes (Deleted)
NEUROLOGY FOLLOW UP OFFICE NOTE  Destiny Henderson [id number]  HISTORY OF PRESENT ILLNESS: Destiny Henderson is a 59 year old female with HTN and history of DVT who follows up for migraines and neuropathy.  UPDATE: MRI of brain without contrast on 12/31/2019 personally reviewed was unremarkable. NCV-EMG on 12/23/2019 was normal.  Intensity:  *** Duration:  *** Frequency:  *** Frequency of abortive medication: ***    Current NSAIDS:  ASA 81mg  daily; naproxen 500mg  Current analgesics:   Current triptans:  none Current ergotamine:  none Current anti-emetic:  Zofran ODT 4mg  Current muscle relaxants:  none Current anti-anxiolytic:  none Current sleep aide:  melatonin Current Antihypertensive medications:  Diltiazem; Lasix Current Antidepressant medications:  nortriptyline 10mg  at bedtime Current Anticonvulsant medications:  none Current anti-CGRP:  none Current Vitamins/Herbal/Supplements:  Co ! 10; B complex; melatonin; B6; B12; D; K-dur; C; fish oil Current Antihistamines/Decongestants:  none Other therapy:  none Hormone/birth control:  none  Caffeine:  1 cup coffee 5 days a day.  No soda. Diet:  32 oz water daily.  No soda.  Sometimes skips meals. Exercise:  no Depression:  none; Anxiety:  none Other pain:  Back pain.  S/p L4-5 surgery in 1993 Sleep hygiene:  ***  HISTORY: Migraines: Onset:  Headaches as a child.  Current severe headaches since 64 years old. Location:  Right temple or back of head and upper neck Quality:  Throbbing  Intensity:  severe.  She denies new headache, thunderclap headache or severe headache that wakes her from sleep. Aura:  none Premonitory Phase:  none Postdrome:  none Associated symptoms:  Nausea, vomiting, blurred vision, photophobia, phonophobia, disoriented, dizziness (but not vertigo), off balance, paresthesias in fingers and legs bilaterally.  She denies weakness. Duration:  3 days severe then lingers (total 4 to 6  days) Frequency:  Every 2 week Frequency of abortive medication: takes ibuprofen or Tylenol 3 days a week. Triggers:  unknown Relieving factors:  sleep Activity:  aggravates  CT head without contrast from 11/25/2018 to evaluate headache was personally reviewed and was normal.  Past NSAIDS:  Diclofenac 75mg  EC; ibuprofen 800mg  Past analgesics:  Tylenol #3; tramadol; Tylenol Extra-strength Past abortive triptans:  none Past abortive ergotamine:  none Past muscle relaxants:  Flexeril; Robaxin 500mg  Past anti-emetic:  none Past antihypertensive medications:  Lasix; amlodipine; HCTZ; lisinopril-HCTZ Past antidepressant medications:   none Past anticonvulsant medications:  topiramate Past anti-CGRP:  none Past vitamins/Herbal/Supplements:  none Past antihistamines/decongestants:  none Other past therapies:  none   Family history of headache:  Daughters; son; father; grandmother  Neuropathy: Since around 2015, she has had polyarthralgia with joint stiffness and myalgias.  She also endorsed dry mouth and eyes.  She reports burning and swelling in the legs and feet.  No significant numbness in hands.  No weakness.  In December 2019, she had a positive ANA with titer of 1:320 with negative RF and sed rate 49.  She saw rheumatology.  Labs from March 2020 included B12 344; Hgb A1c 5.8; low D 25-hydroxy 12; TSH 0.845; B6 26; RNP antibody negative; beta-2-glycoprotein IgG/IgM/IgA negative; cardiolipin IgG/IgM/IgA negative; centromere antibody negative; lupus anticoagulant negative; CRP elevated 9.9;  dsDNA negative; G6PD 12.1; Scl 70 ab negative; SSA/SSB abs negative; anti-Smith negative; B1 124; A primary rheumatologic disease was ruled out.  Neuropathy was suspected and she was supposed to have a NCV-EMG which was never scheduled.  She has chronic low back pain and underwent L4-L5 laminectomy in 1993.  MRI  lumbar spine without contrast on 03/18/2018 showed small to moderate sized left paracentral  disc protrusions at L2-3 and L3-4 as well as moderate degenerative disc disease with remote post-surgical changes and no significant spinal or foraminal stenosis.  PAST MEDICAL HISTORY: Past Medical History:  Diagnosis Date  . Amenorrhea, secondary 05/12/2019   Probable menopause  >>  FSH  42.6  (+ menopause)  . Anemia   . Atypical chest pain 05/21/2018  . Benign tumor of bladder   . Dizziness 11/24/2018  . DVT (deep venous thrombosis) ([place])   . Essential hypertension, benign 03/03/2013  . Fatigue 10/23/2019  . Fibroma of heart   . Hepatitis B immune 08/01/2019  . History of DVT (deep vein thrombosis) 02/06/2018  . History of iron deficiency anemia 05/21/2018  . IUD (intrauterine device) in place 06/03/2019   Located low in uterus 2 small fibroids  Note from 05/20/13: Needs IUD changed in 05/2014. (so was probably placed in 2010)  . LLQ pain 05/12/2019   US Showeed absent right ovary Unable to see Left ovary Fibroid uterus (2 small ones) with IUD low in Uterus FSH showed pt is in menopause May take IUD out if desires  . Low back pain radiating to right leg 02/17/2018  . Menorrhagia 05/20/2013   Controlled with Mirena.  Probable submucosal fibroid.   . Mixed hyperlipidemia 05/21/2018  . Morbid obesity ([place]) 03/03/2013  . Obesity 03/03/2013  . OSA (obstructive sleep apnea) 11/23/2019  . Pedal edema 02/06/2018  . Rheumatoid arthritis ([place]) 10/23/2019  . Right knee injury, subsequent encounter 01/24/2018  . Right wrist injury, subsequent encounter 01/24/2018  . Sprain of MCL (medial collateral ligament) of knee 12/12/2016  . Sprain of right ankle 12/12/2016  . Systemic lupus erythematosus ([place]) 10/23/2019    MEDICATIONS: Current Outpatient Medications on File Prior to Visit  Medication Sig Dispense Refill  . b complex vitamins capsule Take 1 capsule by mouth daily.    . Coenzyme Q10 (CO Q 10 PO) Take by mouth.    . diltiazem (CARDIZEM CD) 300 MG 24 hr capsule Take 1 capsule (300 mg total) by mouth daily.  30 capsule 2  . Echinacea 500 MG CAPS Take by mouth.    . fish oil-omega-3 fatty acids 1000 MG capsule Take 2 g by mouth daily.    . Flaxseed, Linseed, 1000 MG CAPS Take by mouth.    . furosemide (LASIX) 40 MG tablet Take 1 tablet (40 mg total) by mouth daily. 90 tablet 1  . hydrALAZINE (APRESOLINE) 50 MG tablet Take 1 tablet (50 mg total) by mouth in the morning and at bedtime. 180 tablet 3  . ibuprofen (ADVIL) 800 MG tablet Take 1 tablet (800 mg total) by mouth every 8 (eight) hours as needed (pain). 30 tablet 0  . Melatonin 5 MG CAPS Take by mouth as needed.     . Multiple Vitamins-Minerals (MEGA MULTIVITAMIN FOR WOMEN) TABS Take by mouth.    . naproxen (NAPROSYN) 500 MG tablet Take 1 tablet (500 mg total) by mouth 2 (two) times daily as needed. 20 tablet 3  . nortriptyline (PAMELOR) 10 MG capsule Take 1 capsule (10 mg total) by mouth at bedtime. 30 capsule 3  . orlistat (XENICAL) 120 MG capsule Take 1 capsule (120 mg total) by mouth 3 (three) times daily with meals. 90 capsule 1  . OZEMPIC, 1 MG/DOSE, 4 MG/3ML SOPN Inject 1 mg as directed every 7 (seven) days.    . potassium chloride (K-DUR) 10 MEQ  tablet Take 1 tablet (10 mEq total) by mouth daily. 90 tablet 1  . Pyridoxine HCl (VITAMIN B-6) 500 MG tablet Take 500 mg by mouth daily.    Marland Kitchen spironolactone (ALDACTONE) 25 MG tablet Take 0.5 tablets (12.5 mg total) by mouth daily. 30 tablet 1  . telmisartan-hydrochlorothiazide (MICARDIS HCT) 80-25 MG tablet Take 1 tablet by mouth daily. 90 tablet 1  . VITAMIN A OP Apply to eye.    . vitamin B-12 (CYANOCOBALAMIN) 1000 MCG tablet Take 1,000 mcg by mouth daily.    . vitamin C (ASCORBIC ACID) 500 MG tablet Take 500 mg by mouth daily.    Marland Kitchen VITAMIN D, CHOLECALCIFEROL, PO Take by mouth.    . Vitamin D, Ergocalciferol, (DRISDOL) 1.25 MG (50000 UNIT) CAPS capsule Take 1 capsule (50,000 Units total) by mouth every 7 (seven) days. 12 capsule 0   No current facility-administered medications on file prior  to visit.    ALLERGIES: Allergies  Allergen Reactions  . Latex Rash    Other reaction(s): Chest Pain  . Lisinopril Swelling    Angioedema   . Lasix [Furosemide] Rash and Other (See Comments)    Shortness of breath  . Prednisone Rash  . Sulfur Rash    FAMILY HISTORY: Family History  Problem Relation Age of Onset  . Breast cancer Mother   . Hypertension Mother   . Hyperlipidemia Mother   . Colon cancer Father   . Cancer Father   . Thyroid disease Sister   . Cancer Other   . Thyroid disease Sister   . Hypertension Maternal Aunt   . Heart failure Maternal Aunt   . Cancer Maternal Aunt   . Hypertension Maternal Aunt   . Hypertension Maternal Uncle   . Diabetes Maternal Uncle   . Heart failure Maternal Grandmother   . Cancer Maternal Grandfather        prostate    SOCIAL HISTORY: Social History   Socioeconomic History  . Marital status: Married    Spouse name: Not on file  . Number of children: 3  . Years of education: 37  . Highest education level: Not on file  Occupational History  . Occupation: Network engineer  Tobacco Use  . Smoking status: Never Smoker  . Smokeless tobacco: Never Used  Vaping Use  . Vaping Use: Never used  Substance and Sexual Activity  . Alcohol use: No  . Drug use: No  . Sexual activity: Not on file  Other Topics Concern  . Not on file  Social History Narrative  . Not on file   Social Determinants of Health   Financial Resource Strain:   . Difficulty of Paying Living Expenses:   Food Insecurity:   . Worried About Charity fundraiser in the Last Year:   . Arboriculturist in the Last Year:   Transportation Needs:   . Film/video editor (Medical):   Marland Kitchen Lack of Transportation (Non-Medical):   Physical Activity:   . Days of Exercise per Week:   . Minutes of Exercise per Session:   Stress:   . Feeling of Stress :   Social Connections:   . Frequency of Communication with Friends and Family:   . Frequency of Social Gatherings with  Friends and Family:   . Attends Religious Services:   . Active Member of Clubs or Organizations:   . Attends Archivist Meetings:   Marland Kitchen Marital Status:   Intimate Partner Violence:   . Fear of Current or  Ex-Partner:   . Emotionally Abused:   Marland Kitchen Physically Abused:   . Sexually Abused:     PHYSICAL EXAM: *** General: No acute distress.  Patient appears well-groomed.   Head:  Normocephalic/atraumatic Eyes:  Fundi examined but not visualized Neck: supple, no paraspinal tenderness, full range of motion Heart:  Regular rate and rhythm Lungs:  Clear to auscultation bilaterally Back: No paraspinal tenderness Neurological Exam: alert and oriented to person, place, and time. Attention span and concentration intact, recent and remote memory intact, fund of knowledge intact.  Speech fluent and not dysarthric, language intact.  CN II-XII intact. Bulk and tone normal, muscle strength 5/5 throughout.  Sensation to light touch, temperature and vibration intact.  Deep tendon reflexes 2+ throughout, toes downgoing.  Finger to nose and heel to shin testing intact.  Gait normal, Romberg negative.  IMPRESSION: 1.  Migraine with aura, without status migrainosus, *** 2.  Neuropathy.  ***  PLAN: 1.  For preventative management, *** 2.  For abortive therapy, *** 3.  Limit use of pain relievers to no more than 2 days out of week to prevent risk of rebound or medication-overuse headache. 4.  Keep headache diary 5.  Exercise, hydration, caffeine cessation, sleep hygiene, monitor for and avoid triggers 6. Follow up ***   Metta Clines, DO  CC: Riki Sheer, DO

## 2020-03-22 ENCOUNTER — Ambulatory Visit: Payer: PRIVATE HEALTH INSURANCE | Admitting: Neurology

## 2020-03-23 NOTE — Addendum Note (Signed)
Addended by: Freada Bergeron on: 03/23/2020 05:16 PM   Modules accepted: Orders

## 2020-03-23 NOTE — Telephone Encounter (Signed)
Informed patient of sleep study results and patient understanding was verbalized. Patient understands her sleep study showed  IMPRESSIONS - CPAP was initiated at 6 cm and was titrated to optimal PAP pressure at 18 cm of water; AHI 0; O2 nadir 95%. REM sleep was achieved for 29.5 minutes at 18 cm pressue. Although AHI's were excellent at pressures 9 and above, snoring persisted and ultimately resolved at 18 cm. If patient has difficulty with high pressure, consider initial trial of Auto CPAP 11-20 cm of water.  - Moderate oxygen desaturations were observed during this titration to a  nadir of 86.0% at 7 cm of water.  -DIAGNOSIS - Obstructive Sleep Apnea (327.23 [G47.33 ICD-10])  RECOMMENDATIONS - Trial of CPAP therapy with EPR of 3 at 18 cm H2O with heated humidification. If patient has difficulty with high pressure, consider initial trial of Auto CPAP 11-20 cm of water.  A Medium Cushion size [employer] Resp[employer]ics Minimal Museum/gallery curator Under Nose Frame (M) mask was used for the titration.  Pt is aware and agreeable to her results. Upon patient request DME selection is [facility] Patient understands she will be contacted by [facility] to set up her cpap. Patient understands to call if [facility] does not contact her with new setup in a timely manner. Patient understands they will be called once confirmation has been received from [facility] that they have received their new machine to schedule 10 week follow up appointment.  [facility] notified of new cpap order  Please add to airview Patient was grateful for the call and thanked me.

## 2020-03-24 ENCOUNTER — Telehealth: Payer: Self-pay | Admitting: Neurology

## 2020-03-24 ENCOUNTER — Other Ambulatory Visit: Payer: Self-pay

## 2020-03-24 DIAGNOSIS — G130 Paraneoplastic neuromyopathy and neuropathy: Secondary | ICD-10-CM

## 2020-03-24 DIAGNOSIS — C3490 Malignant neoplasm of unspecified part of unspecified bronchus or lung: Secondary | ICD-10-CM

## 2020-03-24 DIAGNOSIS — G629 Polyneuropathy, unspecified: Secondary | ICD-10-CM

## 2020-03-24 MED ORDER — GABAPENTIN 100 MG PO CAPS: 100.0000 mg | ORAL_CAPSULE | Freq: Three times a day (TID) | ORAL | 0 refills | Status: DC

## 2020-03-24 MED FILL — GABAPENTIN 100 MG CAPSULE: 100 | 30 days supply | Qty: 90 | Fill #0

## 2020-03-24 NOTE — Telephone Encounter (Signed)
Pt states she will take the Gabapentin for right now but she do not like taking it. It make her sleepy and feels like that do not help for pain. She took in the past.  Pt advised of Referral to [facility].

## 2020-03-24 NOTE — Telephone Encounter (Signed)
Caller was just speaking with a nurse and has information from another provider that she needs to give her  She has Diabetes and they think she has diabetic neuropathy she wants something else other than gabapentin   Left message with the after hour service on 03-24-20 @ 12:00

## 2020-03-24 NOTE — Telephone Encounter (Signed)
Pt states she takes Ozempic 1mg  for the last two weeks now. Pt states the PCP state she was told the pain may be coming from Diabetic Neuropathy.

## 2020-03-24 NOTE — Telephone Encounter (Signed)
Patient called and said, "My legs have been killing me for the last four days, I have neuropathy. I need some advice on how to get some relief."  [facility]

## 2020-03-24 NOTE — Telephone Encounter (Signed)
We can start gabapentin 100mg  three times daily for nerve pain.  I want to send her to podiatry for punch skin biopsy to evaluate for neuropathy.

## 2020-04-05 ENCOUNTER — Telehealth: Payer: Self-pay | Admitting: Cardiovascular Disease

## 2020-04-05 NOTE — Telephone Encounter (Signed)
Patient requesting a call back from [place]. Patient hung up on me when asking what it was in regards to.

## 2020-04-14 ENCOUNTER — Ambulatory Visit: Payer: PRIVATE HEALTH INSURANCE | Admitting: Podiatry

## 2020-04-15 ENCOUNTER — Ambulatory Visit: Payer: PRIVATE HEALTH INSURANCE | Admitting: Podiatry

## 2020-04-21 ENCOUNTER — Ambulatory Visit: Payer: PRIVATE HEALTH INSURANCE | Admitting: Family Medicine

## 2020-04-21 ENCOUNTER — Telehealth: Payer: Self-pay

## 2020-04-21 NOTE — Telephone Encounter (Signed)
Patient called and asked to speak with a nurse or provider. Patient states she has blood in her urine. I confirmed that she has an appointment today at 115pm with Dr. Nehemiah Settle.   Patient then argues that she got a text that her appointment is at 1:30 pm. I made her aware looking that the schedule we have her down for 1:15pm. I asked then what else I could help her with and she said well do you have anything earlier since you brought it up. I told her she can be seen today at 1:15 pm. Patient agreed and hung up.   Front desk receptionist came back and informed me that patient had spoken with her ten minutes prior to our call and had asked to see Dr. Ihor Dow next Wednesday and she made her aware that there is no availability that day- patient hung up on front desk recptionist. Kathrene Alu RN

## 2020-04-28 ENCOUNTER — Ambulatory Visit: Payer: PRIVATE HEALTH INSURANCE | Admitting: Podiatry

## 2020-05-10 ENCOUNTER — Ambulatory Visit: Payer: PRIVATE HEALTH INSURANCE | Admitting: Podiatry

## 2020-05-13 ENCOUNTER — Ambulatory Visit: Payer: PRIVATE HEALTH INSURANCE | Admitting: Cardiology

## 2020-05-19 ENCOUNTER — Ambulatory Visit: Payer: PRIVATE HEALTH INSURANCE | Admitting: Podiatry

## 2020-05-24 NOTE — Telephone Encounter (Signed)
lmtcb concerning questions or concerns that the patient had. Told patient to disregard message if concerns had been addressed.

## 2020-06-09 ENCOUNTER — Encounter (INDEPENDENT_AMBULATORY_CARE_PROVIDER_SITE_OTHER): Payer: PRIVATE HEALTH INSURANCE | Admitting: Podiatry

## 2020-06-09 NOTE — Progress Notes (Signed)
This encounter was created in error - please disregard.

## 2020-06-17 ENCOUNTER — Other Ambulatory Visit: Payer: Self-pay

## 2020-06-17 ENCOUNTER — Telehealth: Payer: Self-pay | Admitting: Medical

## 2020-06-17 ENCOUNTER — Ambulatory Visit: Payer: PRIVATE HEALTH INSURANCE | Attending: Internal Medicine

## 2020-06-17 ENCOUNTER — Ambulatory Visit (INDEPENDENT_AMBULATORY_CARE_PROVIDER_SITE_OTHER): Payer: PRIVATE HEALTH INSURANCE | Admitting: Medical

## 2020-06-17 VITALS — BP 147/86 | HR 78 | Temp 98.5°F | Resp 20 | Ht 65.0 in | Wt 237.2 lb

## 2020-06-17 DIAGNOSIS — Z7189 Other specified counseling: Secondary | ICD-10-CM | POA: Diagnosis not present

## 2020-06-17 DIAGNOSIS — R300 Dysuria: Secondary | ICD-10-CM | POA: Diagnosis not present

## 2020-06-17 DIAGNOSIS — Z7185 Encounter for immunization safety counseling: Secondary | ICD-10-CM

## 2020-06-17 DIAGNOSIS — Z23 Encounter for immunization: Secondary | ICD-10-CM

## 2020-06-17 LAB — POC URINALSYSI DIPSTICK (AUTOMATED)
Bilirubin, UA: NEGATIVE
Blood, UA: NEGATIVE
Glucose, UA: NEGATIVE
Ketones, UA: NEGATIVE
Leukocytes, UA: NEGATIVE
Nitrite, UA: NEGATIVE
Protein, UA: NEGATIVE
Spec Grav, UA: 1.02 (ref 1.010–1.025)
Urobilinogen, UA: 0.2 E.U./dL
pH, UA: 7 (ref 5.0–8.0)

## 2020-06-17 MED ORDER — CEPHALEXIN 500 MG PO CAPS: 500.0000 mg | ORAL_CAPSULE | Freq: Two times a day (BID) | ORAL | 0 refills | Status: DC

## 2020-06-17 MED ORDER — PHENAZOPYRIDINE HCL 200 MG PO TABS: 200.0000 mg | ORAL_TABLET | Freq: Three times a day (TID) | ORAL | 0 refills | Status: DC | PRN

## 2020-06-17 MED FILL — PHENAZOPYRIDINE 200 MG TAB: 200 | 3 days supply | Qty: 10 | Fill #0

## 2020-06-17 MED FILL — CEPHALEXIN 500 MG CAPSULE: 500 | 7 days supply | Qty: 14 | Fill #0

## 2020-06-17 NOTE — Telephone Encounter (Signed)
Called left message to call back 

## 2020-06-17 NOTE — Telephone Encounter (Signed)
Yes, plz sched at her convenience. Ty.

## 2020-06-17 NOTE — Telephone Encounter (Signed)
Needs appt

## 2020-06-17 NOTE — Progress Notes (Signed)
Subjective:    Patient ID: Destiny Henderson, female    DOB: 02/19/1961, 59 y.o.   MRN: [id number]  HPI  Pt in today reporting urinary symptoms. She is convinced has uti. Signs and symptoms for 3 dya.  Dysuria- yes Frequent urination-yes Hesitancy- Suprapubic pressure-yes Fever-no chills-no Nausea-no Vomiting- CVA pain-no. History of UTI- Gross hematuria-no  Pt states this year some uti.  Pt wants flu vaccine and shingles vaccine. Pt wants to get covid vaccine.  She states reaction to macrobid.    Review of Systems  Constitutional: Negative for chills, fatigue and fever.  Respiratory: Negative for cough, chest tightness and shortness of breath.   Cardiovascular: Negative for palpitations.  Gastrointestinal: Negative for abdominal pain and constipation.  Genitourinary:       See hpi  Musculoskeletal: Positive for back pain.       Some rt cva reported. But see exam.  Skin: Negative for rash.  Hematological: Negative for adenopathy. Does not bruise/bleed easily.   Past Medical History:  Diagnosis Date  . Amenorrhea, secondary 05/12/2019   Probable menopause  >>  FSH  42.6  (+ menopause)  . Anemia   . Atypical chest pain 05/21/2018  . Benign tumor of bladder   . Dizziness 11/24/2018  . DVT (deep venous thrombosis) ([place])   . Essential hypertension, benign 03/03/2013  . Fatigue 10/23/2019  . Fibroma of heart   . Hepatitis B immune 08/01/2019  . History of DVT (deep vein thrombosis) 02/06/2018  . History of iron deficiency anemia 05/21/2018  . IUD (intrauterine device) in place 06/03/2019   Located low in uterus 2 small fibroids  Note from 05/20/13: Needs IUD changed in 05/2014. (so was probably placed in 2010)  . LLQ pain 05/12/2019   US Showeed absent right ovary Unable to see Left ovary Fibroid uterus (2 small ones) with IUD low in Uterus FSH showed pt is in menopause May take IUD out if desires  . Low back pain radiating to right leg 02/17/2018  . Menorrhagia  05/20/2013   Controlled with Mirena.  Probable submucosal fibroid.   . Mixed hyperlipidemia 05/21/2018  . Morbid obesity ([place]) 03/03/2013  . Obesity 03/03/2013  . OSA (obstructive sleep apnea) 11/23/2019  . Pedal edema 02/06/2018  . Rheumatoid arthritis ([place]) 10/23/2019  . Right knee injury, subsequent encounter 01/24/2018  . Right wrist injury, subsequent encounter 01/24/2018  . Sprain of MCL (medial collateral ligament) of knee 12/12/2016  . Sprain of right ankle 12/12/2016  . Systemic lupus erythematosus ([place]) 10/23/2019     Social History   Socioeconomic History  . Marital status: Married    Spouse name: Not on file  . Number of children: 3  . Years of education: 3  . Highest education level: Not on file  Occupational History  . Occupation: Network engineer  Tobacco Use  . Smoking status: Never Smoker  . Smokeless tobacco: Never Used  Vaping Use  . Vaping Use: Never used  Substance and Sexual Activity  . Alcohol use: No  . Drug use: No  . Sexual activity: Not on file  Other Topics Concern  . Not on file  Social History Narrative  . Not on file   Social Determinants of Health   Financial Resource Strain:   . Difficulty of Paying Living Expenses: Not on file  Food Insecurity:   . Worried About Charity fundraiser in the Last Year: Not on file  . Ran Out of Food in the Last Year:  Not on file  Transportation Needs:   . Lack of Transportation (Medical): Not on file  . Lack of Transportation (Non-Medical): Not on file  Physical Activity:   . Days of Exercise per Week: Not on file  . Minutes of Exercise per Session: Not on file  Stress:   . Feeling of Stress : Not on file  Social Connections:   . Frequency of Communication with Friends and Family: Not on file  . Frequency of Social Gatherings with Friends and Family: Not on file  . Attends Religious Services: Not on file  . Active Member of Clubs or Organizations: Not on file  . Attends Archivist Meetings: Not on file    . Marital Status: Not on file  Intimate Partner Violence:   . Fear of Current or Ex-Partner: Not on file  . Emotionally Abused: Not on file  . Physically Abused: Not on file  . Sexually Abused: Not on file    Past Surgical History:  Procedure Laterality Date  . BACK SURGERY    . bladder tack    . DILATION AND CURETTAGE OF UTERUS     after SAB  . HEART TUMOR EXCISION      Family History  Problem Relation Age of Onset  . Breast cancer Mother   . Hypertension Mother   . Hyperlipidemia Mother   . Colon cancer Father   . Cancer Father   . Thyroid disease Sister   . Cancer Other   . Thyroid disease Sister   . Hypertension Maternal Aunt   . Heart failure Maternal Aunt   . Cancer Maternal Aunt   . Hypertension Maternal Aunt   . Hypertension Maternal Uncle   . Diabetes Maternal Uncle   . Heart failure Maternal Grandmother   . Cancer Maternal Grandfather        prostate    Allergies  Allergen Reactions  . Latex Rash    Other reaction(s): Chest Pain  . Lisinopril Swelling    Angioedema   . Lasix [Furosemide] Rash and Other (See Comments)    Shortness of breath  . Prednisone Rash  . Sulfur Rash    Current Outpatient Medications on File Prior to Visit  Medication Sig Dispense Refill  . b complex vitamins capsule Take 1 capsule by mouth daily.    . Coenzyme Q10 (CO Q 10 PO) Take by mouth.    . diltiazem (CARDIZEM CD) 300 MG 24 hr capsule Take 1 capsule (300 mg total) by mouth daily. 30 capsule 2  . Echinacea 500 MG CAPS Take by mouth.    . fish oil-omega-3 fatty acids 1000 MG capsule Take 2 g by mouth daily.    . Flaxseed, Linseed, 1000 MG CAPS Take by mouth.    . furosemide (LASIX) 40 MG tablet Take 1 tablet (40 mg total) by mouth daily. 90 tablet 1  . gabapentin (NEURONTIN) 100 MG capsule Take 1 capsule (100 mg total) by mouth 3 (three) times daily. 90 capsule 0  . hydrALAZINE (APRESOLINE) 50 MG tablet Take 1 tablet (50 mg total) by mouth in the morning and at  bedtime. 180 tablet 3  . ibuprofen (ADVIL) 800 MG tablet Take 1 tablet (800 mg total) by mouth every 8 (eight) hours as needed (pain). 30 tablet 0  . Melatonin 5 MG CAPS Take by mouth as needed.     . Multiple Vitamins-Minerals (MEGA MULTIVITAMIN FOR WOMEN) TABS Take by mouth.    . naproxen (NAPROSYN) 500 MG tablet Take  1 tablet (500 mg total) by mouth 2 (two) times daily as needed. 20 tablet 3  . nortriptyline (PAMELOR) 10 MG capsule Take 1 capsule (10 mg total) by mouth at bedtime. 30 capsule 3  . orlistat (XENICAL) 120 MG capsule Take 1 capsule (120 mg total) by mouth 3 (three) times daily with meals. 90 capsule 1  . OZEMPIC, 1 MG/DOSE, 4 MG/3ML SOPN Inject 1 mg as directed every 7 (seven) days.    . potassium chloride (K-DUR) 10 MEQ tablet Take 1 tablet (10 mEq total) by mouth daily. 90 tablet 1  . Pyridoxine HCl (VITAMIN B-6) 500 MG tablet Take 500 mg by mouth daily.    Marland Kitchen spironolactone (ALDACTONE) 25 MG tablet Take 0.5 tablets (12.5 mg total) by mouth daily. 30 tablet 1  . telmisartan-hydrochlorothiazide (MICARDIS HCT) 80-25 MG tablet Take 1 tablet by mouth daily. 90 tablet 1  . VITAMIN A OP Apply to eye.    . vitamin B-12 (CYANOCOBALAMIN) 1000 MCG tablet Take 1,000 mcg by mouth daily.    . vitamin C (ASCORBIC ACID) 500 MG tablet Take 500 mg by mouth daily.    Marland Kitchen VITAMIN D, CHOLECALCIFEROL, PO Take by mouth.    . Vitamin D, Ergocalciferol, (DRISDOL) 1.25 MG (50000 UNIT) CAPS capsule Take 1 capsule (50,000 Units total) by mouth every 7 (seven) days. 12 capsule 0   No current facility-administered medications on file prior to visit.    BP (!) 147/86 (BP Location: Left Arm, Patient Position: Sitting, Cuff Size: Large)   Pulse 78   Temp 98.5 F (36.9 C) (Oral)   Resp 20   Ht 5\' 5"  (1.651 m)   Wt 237 lb 3.2 oz (107.6 kg)   SpO2 99%   BMI 39.47 kg/m       Objective:   Physical Exam  General- No acute distress. Pleasant patient. Neck- Full range of motion, no jvd Lungs- Clear,  even and unlabored. Heart- regular rate and rhythm. Neurologic- CNII- XII grossly intact. Abdomen- soft,nt,nd, +bs, no rebound or guarding. Does have faint suprapubic tenderness. Back- no cva tenderness either side on exam.      Assessment & Plan:  Your appear to have a urinary tract infection. I am prescribing keflex  antibiotic for the probable infection. Hydrate well. I am sending out a urine culture. During the interim if your signs and symptoms worsen rather than improving please notify us. We will notify your when the culture results are back.  Also rx pyridium for pain.  Counseled on vaccines today.  [facility] with [facility] I explained to her that  if she can get a COVID booster scheduled within the next week that would take priority over flu vaccine.  Then schedule for flu vaccine in about 3 to 4 weeks.  Also explained to her that it is she is told she is not eligible for Covid vaccine for another month then she she could get flu vaccine today.   Follow up in 7 days or as needed.  Time spent with patient today was 30 minutes which consisted of chart review, discussing diagnosis, work up, treatment, vaccine counseling and documentation. Also sent notes to pcp regarding her sleep apnea, diet med questions and disability paperwork.

## 2020-06-17 NOTE — Telephone Encounter (Signed)
We had placed a referral back in 07/2019. I'm not sure she saw them? Can we give her contact info for appt? Ty.

## 2020-06-17 NOTE — Telephone Encounter (Signed)
Pt wants to know what is going with her cpap. She is using and expecting results/feed back from pulmonlogist office. Pt states her machine is stating she is not sleeping more than 3 hours.   Pt does not know number of where she went. Will you call pt and give her number.

## 2020-06-17 NOTE — Patient Instructions (Addendum)
Your appear to have a urinary tract infection. I am prescribing keflex  antibiotic for the probable infection. Hydrate well. I am sending out a urine culture. During the interim if your signs and symptoms worsen rather than improving please notify us. We will notify your when the culture results are back.  Also rx pyridium for pain.  Counseled on vaccines today.  [facility] I explained to her that  if she can get a COVID booster scheduled within the next week that would take priority over flu vaccine.  Then schedule for flu vaccine in about 3 to 4 weeks.  Also explained to her that it is she is told she is not eligible for Covid vaccine for another month then she she could get flu vaccine today.  Follow up in 7 days or as needed.

## 2020-06-17 NOTE — Telephone Encounter (Addendum)
Also diet med question. Pt likley needs follow up with [name]. Will you call her about this as well. Also disabilty paperwork question. [phone].

## 2020-06-17 NOTE — Progress Notes (Signed)
   Covid-19 Vaccination Clinic  Name:  Destiny Henderson    MRN: [id number] DOB: [birth year]  06/17/2020  Ms. Strike was observed post Covid-19 immunization for 15 minutes without incident. She was provided with Vaccine Information Sheet and instruction to access the V-Safe system.   [name] was instructed to call 911 with any severe reactions post vaccine: Marland Kitchen Difficulty breathing  . Swelling of face and throat  . A fast heartbeat  . A bad rash all over body  . Dizziness and weakness

## 2020-06-18 LAB — URINE CULTURE
MICRO NUMBER:: 10935568
Result:: NO GROWTH
SPECIMEN QUALITY:: ADEQUATE

## 2020-06-20 NOTE — Telephone Encounter (Signed)
Called left message to call back 

## 2020-06-20 NOTE — Telephone Encounter (Signed)
Called the patient to inform needs appt. She put me a hold for extended period of time/was then disconnected.

## 2020-06-23 NOTE — Telephone Encounter (Signed)
Appt scheduled for 07/08/2020.

## 2020-07-07 ENCOUNTER — Telehealth: Payer: Self-pay | Admitting: Neurology

## 2020-07-07 ENCOUNTER — Telehealth: Payer: Self-pay | Admitting: Internal Medicine

## 2020-07-07 NOTE — Telephone Encounter (Signed)
Reviewed pt chart. I don not see any restrictions.

## 2020-07-07 NOTE — Telephone Encounter (Signed)
lmtcb for pt to see if its ok to give information to [facility].

## 2020-07-07 NOTE — Telephone Encounter (Signed)
Christy from [facility] called to ask if patient has been placed on any work restrictions from our office. Please call.

## 2020-07-08 ENCOUNTER — Ambulatory Visit: Payer: PRIVATE HEALTH INSURANCE | Admitting: Family Medicine

## 2020-07-08 NOTE — Telephone Encounter (Signed)
LMTCB and will close per protocol  

## 2020-07-11 ENCOUNTER — Telehealth: Payer: Self-pay | Admitting: Internal Medicine

## 2020-07-11 NOTE — Telephone Encounter (Signed)
I never placed her on any work restrictions

## 2020-07-11 NOTE — Telephone Encounter (Signed)
Telephone call to [place] at [place]: Per DR.Jaffe pt never placed on any work Restrictions by him at any time.

## 2020-07-11 NOTE — Telephone Encounter (Signed)
Per Dr. Janee Morn last OV note-OSA workup was in process by cardiology.  I have left a detail message for patient and recommended that she contact cardiology regarding this matter.  Nothing further needed at this time.

## 2020-07-14 DIAGNOSIS — D151 Benign neoplasm of heart: Secondary | ICD-10-CM | POA: Insufficient documentation

## 2020-07-14 DIAGNOSIS — I82409 Acute embolism and thrombosis of unspecified deep veins of unspecified lower extremity: Secondary | ICD-10-CM | POA: Insufficient documentation

## 2020-07-14 DIAGNOSIS — D649 Anemia, unspecified: Secondary | ICD-10-CM | POA: Insufficient documentation

## 2020-07-14 DIAGNOSIS — D303 Benign neoplasm of bladder: Secondary | ICD-10-CM | POA: Insufficient documentation

## 2020-07-15 ENCOUNTER — Ambulatory Visit (INDEPENDENT_AMBULATORY_CARE_PROVIDER_SITE_OTHER): Payer: PRIVATE HEALTH INSURANCE | Admitting: Cardiology

## 2020-07-15 ENCOUNTER — Other Ambulatory Visit: Payer: Self-pay

## 2020-07-15 ENCOUNTER — Encounter: Payer: Self-pay | Admitting: Cardiology

## 2020-07-15 VITALS — BP 168/110 | HR 75 | Ht 65.0 in | Wt 239.0 lb

## 2020-07-15 DIAGNOSIS — G4733 Obstructive sleep apnea (adult) (pediatric): Secondary | ICD-10-CM | POA: Diagnosis not present

## 2020-07-15 DIAGNOSIS — I10 Essential (primary) hypertension: Secondary | ICD-10-CM

## 2020-07-15 DIAGNOSIS — R5383 Other fatigue: Secondary | ICD-10-CM | POA: Diagnosis not present

## 2020-07-15 NOTE — Progress Notes (Signed)
Cardiology Office Note:    Date:  07/15/2020   ID:  Destiny Henderson, DOB 1961/02/03, MRN [id number]  PCP:  Shelda Pal, DO  Cardiologist:  Jenne Campus, MD    Referring MD: Shelda Pal*   No chief complaint on file. I am not doing well  History of Present Illness:    Destiny Henderson is a 59 y.o. female with very complex past medical history.  She is quite a difficult patient.  The issue that could dealing with it with is management of her blood pressure.  She comes today she is upset about her blood pressure not being well controlled the list of the medication we have in the chart is completely not what she is taking she have difficulty recalling what medication she takes she thinks she is taking Cardizem CD, Micardis hydrochlorothiazide, losartan.  However when the call pharmacist and she said she used 2 different pharmacies for her medications it looks like on May 21 she pick up a 30-day supply for for losartan 25, and in September she did not pick up any medication for her high blood pressure.  In different pharmacy in March she pick up hydralazine 50 mg twice daily.  I am ordering that significant problem here could be noncompliance.  Pharmacy told as there are multiple refills that she supposed to pick up but that she never did.  We find out from the pharmacist that she got a different account open for her name.  I asked her today to call us back later tomorrow and tell us exactly what medication she got into drawing how she takes dose.  This is absolute mess which will be difficult to sort through.  She tells me that she is used CPAP mask with some relief, but she does not think mask is properly adjusted.  Described fatigue tiredness she is upset with the fact that she still have to work in spite of her high blood pressure being uncontrolled.  Past Medical History:  Diagnosis Date  . Amenorrhea, secondary 05/12/2019   Probable menopause  >>  FSH   42.6  (+ menopause)  . Anemia   . Atypical chest pain 05/21/2018  . Benign tumor of bladder   . Dizziness 11/24/2018  . DVT (deep venous thrombosis) ([place])   . Essential hypertension, benign 03/03/2013  . Fatigue 10/23/2019  . Fibroma of heart   . Hepatitis B immune 08/01/2019  . History of DVT (deep vein thrombosis) 02/06/2018  . History of iron deficiency anemia 05/21/2018  . IUD (intrauterine device) in place 06/03/2019   Located low in uterus 2 small fibroids  Note from 05/20/13: Needs IUD changed in 05/2014. (so was probably placed in 2010)  . LLQ pain 05/12/2019   US Showeed absent right ovary Unable to see Left ovary Fibroid uterus (2 small ones) with IUD low in Uterus FSH showed pt is in menopause May take IUD out if desires  . Low back pain radiating to right leg 02/17/2018  . Menorrhagia 05/20/2013   Controlled with Mirena.  Probable submucosal fibroid.   . Mixed hyperlipidemia 05/21/2018  . Morbid obesity ([place]) 03/03/2013  . Obesity 03/03/2013  . OSA (obstructive sleep apnea) 11/23/2019  . Pedal edema 02/06/2018  . Rheumatoid arthritis ([place]) 10/23/2019  . Right knee injury, subsequent encounter 01/24/2018  . Right wrist injury, subsequent encounter 01/24/2018  . Sprain of MCL (medial collateral ligament) of knee 12/12/2016  . Sprain of right ankle 12/12/2016  .  Systemic lupus erythematosus ([place]) 10/23/2019    Past Surgical History:  Procedure Laterality Date  . BACK SURGERY    . bladder tack    . DILATION AND CURETTAGE OF UTERUS     after SAB  . HEART TUMOR EXCISION      Current Medications: Current Meds  Medication Sig  . b complex vitamins capsule Take 1 capsule by mouth daily.  . Coenzyme Q10 (CO Q 10 PO) Take by mouth.  . diltiazem (CARDIZEM CD) 300 MG 24 hr capsule Take 1 capsule (300 mg total) by mouth daily.  . Echinacea 500 MG CAPS Take by mouth.  . fish oil-omega-3 fatty acids 1000 MG capsule Take 2 g by mouth daily.  . Flaxseed, Linseed, 1000 MG CAPS Take by mouth.  .  gabapentin (NEURONTIN) 100 MG capsule Take 1 capsule (100 mg total) by mouth 3 (three) times daily.  Marland Kitchen ibuprofen (ADVIL) 800 MG tablet Take 1 tablet (800 mg total) by mouth every 8 (eight) hours as needed (pain).  . Melatonin 5 MG CAPS Take by mouth as needed.   . Multiple Vitamins-Minerals (MEGA MULTIVITAMIN FOR WOMEN) TABS Take by mouth.  . naproxen (NAPROSYN) 500 MG tablet Take 1 tablet (500 mg total) by mouth 2 (two) times daily as needed.  Marland Kitchen OZEMPIC, 1 MG/DOSE, 4 MG/3ML SOPN Inject 1 mg as directed every 7 (seven) days.  . potassium chloride (K-DUR) 10 MEQ tablet Take 1 tablet (10 mEq total) by mouth daily.  . Pyridoxine HCl (VITAMIN B-6) 500 MG tablet Take 500 mg by mouth daily.  Marland Kitchen spironolactone (ALDACTONE) 25 MG tablet Take 0.5 tablets (12.5 mg total) by mouth daily.  Marland Kitchen telmisartan-hydrochlorothiazide (MICARDIS HCT) 80-25 MG tablet Take 1 tablet by mouth daily.  Marland Kitchen VITAMIN A OP Apply to eye.  . vitamin B-12 (CYANOCOBALAMIN) 1000 MCG tablet Take 1,000 mcg by mouth daily.  . vitamin C (ASCORBIC ACID) 500 MG tablet Take 500 mg by mouth daily.  Marland Kitchen VITAMIN D, CHOLECALCIFEROL, PO Take by mouth.  . Vitamin D, Ergocalciferol, (DRISDOL) 1.25 MG (50000 UNIT) CAPS capsule Take 1 capsule (50,000 Units total) by mouth every 7 (seven) days.     Allergies:   Latex, Lisinopril, Lasix [furosemide], Prednisone, and Sulfur   Social History   Socioeconomic History  . Marital status: Married    Spouse name: Not on file  . Number of children: 3  . Years of education: 69  . Highest education level: Not on file  Occupational History  . Occupation: Network engineer  Tobacco Use  . Smoking status: Never Smoker  . Smokeless tobacco: Never Used  Vaping Use  . Vaping Use: Never used  Substance and Sexual Activity  . Alcohol use: No  . Drug use: No  . Sexual activity: Not on file  Other Topics Concern  . Not on file  Social History Narrative  . Not on file   Social Determinants of Health   Financial  Resource Strain:   . Difficulty of Paying Living Expenses: Not on file  Food Insecurity:   . Worried About Charity fundraiser in the Last Year: Not on file  . Ran Out of Food in the Last Year: Not on file  Transportation Needs:   . Lack of Transportation (Medical): Not on file  . Lack of Transportation (Non-Medical): Not on file  Physical Activity:   . Days of Exercise per Week: Not on file  . Minutes of Exercise per Session: Not on file  Stress:   .  Feeling of Stress : Not on file  Social Connections:   . Frequency of Communication with Friends and Family: Not on file  . Frequency of Social Gatherings with Friends and Family: Not on file  . Attends Religious Services: Not on file  . Active Member of Clubs or Organizations: Not on file  . Attends Archivist Meetings: Not on file  . Marital Status: Not on file     Family History: The patient's family history includes Breast cancer in her mother; Cancer in her father, maternal aunt, maternal grandfather, and another family member; Colon cancer in her father; Diabetes in her maternal uncle; Heart failure in her maternal aunt and maternal grandmother; Hyperlipidemia in her mother; Hypertension in her maternal aunt, maternal aunt, maternal uncle, and mother; Thyroid disease in her sister and sister. ROS:   Please see the history of present illness.    All 14 point review of systems negative except as described per history of present illness  EKGs/Labs/Other Studies Reviewed:      Recent Labs: 07/30/2019: Hemoglobin 11.5; Platelets 371.0; Pro B Natriuretic peptide (BNP) 28.0 08/07/2019: ALT 12 10/23/2019: TSH 0.85 01/06/2020: BUN 11; Creatinine, Ser 0.94; Potassium 4.5; Sodium 142  Recent Lipid Panel    Component Value Date/Time   CHOL 201 (H) 08/07/2019 1422   TRIG 112.0 08/07/2019 1422   HDL 45.20 08/07/2019 1422   CHOLHDL 4 08/07/2019 1422   VLDL 22.4 08/07/2019 1422   LDLCALC 133 (H) 08/07/2019 1422    Physical  Exam:    VS:  BP (!) 168/110   Pulse 75   Ht 5\' 5"  (1.651 m)   Wt 239 lb (108.4 kg)   BMI 39.77 kg/m     Wt Readings from Last 3 Encounters:  07/15/20 239 lb (108.4 kg)  06/17/20 237 lb 3.2 oz (107.6 kg)  03/03/20 240 lb (108.9 kg)     GEN:  Well nourished, well developed in no acute distress HEENT: Normal NECK: No JVD; No carotid bruits LYMPHATICS: No lymphadenopathy CARDIAC: RRR, no murmurs, no rubs, no gallops RESPIRATORY:  Clear to auscultation without rales, wheezing or rhonchi  ABDOMEN: Soft, non-tender, non-distended MUSCULOSKELETAL:  No edema; No deformity  SKIN: Warm and dry LOWER EXTREMITIES: no swelling NEUROLOGIC:  Alert and oriented x 3 PSYCHIATRIC:  Normal affect   ASSESSMENT:    1. Essential hypertension, benign   2. OSA (obstructive sleep apnea)   3. Fatigue, unspecified type    PLAN:    In order of problems listed above:  Essential hypertension will be be difficult to control I suspect significant portion of the problem is noncompliance will call all pharmacy that she used to take her medication we find out multiple discrepancies the fact that she did not pick up her refill I am simply worried that she does not take her medications the way she should.  Again I asked her to call us later today or next week and tell us exactly what medication she takes. 2.  Obstructive sleep apnea.  We will send a message to our sleep apnea specialist to see if they can contact her in the chest her setting. 3.  Overall is a sad situation.  She wanted [name] to help her and honestly I do not know how I can help if she is not picking up her medication and she is not taking this medication the proper way.  Regardless we will try to help her the best we can I will talk to her about  what medication she takes and will try to make it simpler for her.  Medication Adjustments/Labs and Tests Ordered: Current medicines are reviewed at length with the patient today.  Concerns regarding  medicines are outlined above.  Orders Placed This Encounter  Procedures  . EKG 12-Lead   Medication changes: No orders of the defined types were placed in this encounter.   Signed, Park Liter, MD, [facility] 07/15/2020 4:03 PM    [facility]

## 2020-07-15 NOTE — Patient Instructions (Signed)

## 2020-08-26 ENCOUNTER — Ambulatory Visit: Payer: PRIVATE HEALTH INSURANCE | Admitting: Orthopaedic Surgery

## 2020-09-09 ENCOUNTER — Encounter: Payer: Self-pay | Admitting: Orthopaedic Surgery

## 2020-09-09 ENCOUNTER — Other Ambulatory Visit: Payer: Self-pay

## 2020-09-09 ENCOUNTER — Other Ambulatory Visit: Payer: Self-pay | Admitting: Family Medicine

## 2020-09-09 ENCOUNTER — Telehealth: Payer: Self-pay | Admitting: Family Medicine

## 2020-09-09 ENCOUNTER — Telehealth (INDEPENDENT_AMBULATORY_CARE_PROVIDER_SITE_OTHER): Payer: PRIVATE HEALTH INSURANCE | Admitting: Family

## 2020-09-09 ENCOUNTER — Other Ambulatory Visit: Payer: Self-pay | Admitting: Family

## 2020-09-09 ENCOUNTER — Ambulatory Visit (INDEPENDENT_AMBULATORY_CARE_PROVIDER_SITE_OTHER): Payer: PRIVATE HEALTH INSURANCE

## 2020-09-09 ENCOUNTER — Ambulatory Visit (INDEPENDENT_AMBULATORY_CARE_PROVIDER_SITE_OTHER): Payer: PRIVATE HEALTH INSURANCE | Admitting: Orthopaedic Surgery

## 2020-09-09 DIAGNOSIS — S8261XA Displaced fracture of lateral malleolus of right fibula, initial encounter for closed fracture: Secondary | ICD-10-CM

## 2020-09-09 DIAGNOSIS — R300 Dysuria: Secondary | ICD-10-CM

## 2020-09-09 DIAGNOSIS — J40 Bronchitis, not specified as acute or chronic: Secondary | ICD-10-CM | POA: Diagnosis not present

## 2020-09-09 MED ORDER — ACETAMINOPHEN-CODEINE #3 300-30 MG PO TABS: 1.0000 | ORAL_TABLET | Freq: Four times a day (QID) | ORAL | 2 refills | Status: DC | PRN

## 2020-09-09 MED ORDER — DICLOFENAC SODIUM 75 MG PO TBEC: 75.0000 mg | DELAYED_RELEASE_TABLET | Freq: Two times a day (BID) | ORAL | 2 refills | Status: DC

## 2020-09-09 MED ORDER — DICLOFENAC SODIUM 1 % EX GEL: 2.0000 g | Freq: Four times a day (QID) | CUTANEOUS | 2 refills | Status: DC

## 2020-09-09 MED ORDER — CHERATUSSIN AC 100-10 MG/5ML PO SOLN: 5.0000 mL | Freq: Three times a day (TID) | ORAL | 0 refills | Status: DC | PRN

## 2020-09-09 MED ORDER — AZITHROMYCIN 250 MG PO TABS: ORAL_TABLET | ORAL | 0 refills | Status: DC

## 2020-09-09 MED FILL — AZITHROMYCIN 250 MG TABLET: 250 | 5 days supply | Qty: 6 | Fill #0

## 2020-09-09 MED FILL — DICLOFENAC SODIUM 75 MG TAB: 75 | 30 days supply | Qty: 60 | Fill #0

## 2020-09-09 MED FILL — GUAIATUSSIN AC LIQUID: 100-10 | 5 days supply | Qty: 75 | Fill #0

## 2020-09-09 MED FILL — DICLOFENAC SODIUM 1 % GEL: 1 | 25 days supply | Qty: 200 | Fill #0

## 2020-09-09 MED FILL — ACETAMINOPHEN-COD #3 TABLET: 300-30 | 7 days supply | Qty: 30 | Fill #0

## 2020-09-09 MED FILL — CARTIA XT 300 MG CAPSULE SA: 300 | 30 days supply | Qty: 30 | Fill #0

## 2020-09-09 NOTE — Progress Notes (Signed)
Office Visit Note   Patient: Destiny Henderson           Date of Birth: 1961-04-15           MRN: [id number] Visit Date: 09/09/2020              Requested by: Shelda Pal, [address] [place],  [place] [address] PCP: Shelda Pal, DO   Assessment & Plan: Visit Diagnoses:  1. Displaced fracture of lateral malleolus of right fibula, initial encounter for closed fracture     Plan: Impression is right ankle peroneal tendinitis.  The patient notes that she already has a cam walker at home so we have suggested that she start wearing this for the next few weeks.  She may then wean into an ASO brace which we provided today.  I have called in Voltaren pills as well as Voltaren gel to use as well.  Also called in Tylenol 3 for breakthrough pain.  She will follow up with [name] in the next 4 to 6 weeks if her symptoms have not improved.  Call with concerns or questions in the meantime.  Follow-Up Instructions: Return if symptoms worsen or fail to improve.   Orders:  Orders Placed This Encounter  Procedures  . XR Ankle Complete Right   Meds ordered this encounter  Medications  . acetaminophen-codeine (TYLENOL #3) 300-30 MG tablet    Sig: Take 1 tablet by mouth every 6 (six) hours as needed for moderate pain.    Dispense:  30 tablet    Refill:  2  . diclofenac (VOLTAREN) 75 MG EC tablet    Sig: Take 1 tablet (75 mg total) by mouth 2 (two) times daily.    Dispense:  60 tablet    Refill:  2  . diclofenac Sodium (VOLTAREN) 1 % GEL    Sig: Apply 2 g topically 4 (four) times daily.    Dispense:  150 g    Refill:  2      Procedures: No procedures performed   Clinical Data: No additional findings.   Subjective: Chief Complaint  Patient presents with  . Right Ankle - Pain    HPI patient is a 59 year old female who comes in today with right ankle pain after she is trying to work out recently.  She was walking on the treadmill quite a bit  when she noticed symptoms to the lateral ankle.  She has had intermittent swelling which has seemed to have improved.  The pain she has is primarily aggravated when wearing high heels or with eversion of the ankle.  She has been using ice, heat over-the-counter pain medication as well as elevating her ankle with mild relief of symptoms.  Of note, she is approximately 1 year out right ankle Weber B distal fibula fracture.  Review of Systems as detailed in HPI.  All other reviewed and are negative.   Objective: Vital Signs: There were no vitals taken for this visit.  Physical Exam well-developed well-nourished female no acute distress.  Alert oriented x3.  Ortho Exam right ankle exam shows mild swelling.  She has moderate tenderness along the peroneal tendon.  She has increased pain with resisted eversion of the ankle.  She is neurovascular intact distally  Specialty Comments:  No specialty comments available.  Imaging: XR Ankle Complete Right  Result Date: 09/09/2020 No acute or structural abnormalities    PMFS History: Patient Active Problem List   Diagnosis Date  Noted  . Fibroma of heart   . DVT (deep venous thrombosis) ([place])   . Benign tumor of bladder   . Anemia   . Syncope and collapse 12/31/2019  . OSA (obstructive sleep apnea) 11/23/2019  . Fatigue 10/23/2019  . Systemic lupus erythematosus ([place]) 10/23/2019  . Rheumatoid arthritis ([place]) 10/23/2019  . Hepatitis B immune 08/01/2019  . IUD (intrauterine device) in place 06/03/2019  . LLQ pain 05/12/2019  . Amenorrhea, secondary 05/12/2019  . Dizziness 11/24/2018  . Atypical chest pain 05/21/2018  . History of iron deficiency anemia 05/21/2018  . Mixed hyperlipidemia 05/21/2018  . Low back pain radiating to right leg 02/17/2018  . Pedal edema 02/06/2018  . History of DVT (deep vein thrombosis) 02/06/2018  . Right wrist injury, subsequent encounter 01/24/2018  . Right knee injury, subsequent encounter 01/24/2018  .  Sprain of MCL (medial collateral ligament) of knee 12/12/2016  . Sprain of right ankle 12/12/2016  . Menorrhagia 05/20/2013  . Essential hypertension, benign 03/03/2013  . Morbid obesity ([place]) 03/03/2013  . Obesity 03/03/2013   Past Medical History:  Diagnosis Date  . Amenorrhea, secondary 05/12/2019   Probable menopause  >>  FSH  42.6  (+ menopause)  . Anemia   . Atypical chest pain 05/21/2018  . Benign tumor of bladder   . Dizziness 11/24/2018  . DVT (deep venous thrombosis) ([place])   . Essential hypertension, benign 03/03/2013  . Fatigue 10/23/2019  . Fibroma of heart   . Hepatitis B immune 08/01/2019  . History of DVT (deep vein thrombosis) 02/06/2018  . History of iron deficiency anemia 05/21/2018  . IUD (intrauterine device) in place 06/03/2019   Located low in uterus 2 small fibroids  Note from 05/20/13: Needs IUD changed in 05/2014. (so was probably placed in 2010)  . LLQ pain 05/12/2019   US Showeed absent right ovary Unable to see Left ovary Fibroid uterus (2 small ones) with IUD low in Uterus FSH showed pt is in menopause May take IUD out if desires  . Low back pain radiating to right leg 02/17/2018  . Menorrhagia 05/20/2013   Controlled with Mirena.  Probable submucosal fibroid.   . Mixed hyperlipidemia 05/21/2018  . Morbid obesity ([place]) 03/03/2013  . Obesity 03/03/2013  . OSA (obstructive sleep apnea) 11/23/2019  . Pedal edema 02/06/2018  . Rheumatoid arthritis ([place]) 10/23/2019  . Right knee injury, subsequent encounter 01/24/2018  . Right wrist injury, subsequent encounter 01/24/2018  . Sprain of MCL (medial collateral ligament) of knee 12/12/2016  . Sprain of right ankle 12/12/2016  . Systemic lupus erythematosus ([place]) 10/23/2019    Family History  Problem Relation Age of Onset  . Breast cancer Mother   . Hypertension Mother   . Hyperlipidemia Mother   . Colon cancer Father   . Cancer Father   . Thyroid disease Sister   . Cancer Other   . Thyroid disease Sister   . Hypertension  Maternal Aunt   . Heart failure Maternal Aunt   . Cancer Maternal Aunt   . Hypertension Maternal Aunt   . Hypertension Maternal Uncle   . Diabetes Maternal Uncle   . Heart failure Maternal Grandmother   . Cancer Maternal Grandfather        prostate    Past Surgical History:  Procedure Laterality Date  . BACK SURGERY    . bladder tack    . DILATION AND CURETTAGE OF UTERUS     after SAB  . HEART TUMOR EXCISION  Social History   Occupational History  . Occupation: Network engineer  Tobacco Use  . Smoking status: Never Smoker  . Smokeless tobacco: Never Used  Vaping Use  . Vaping Use: Never used  Substance and Sexual Activity  . Alcohol use: No  . Drug use: No  . Sexual activity: Not on file

## 2020-09-09 NOTE — Telephone Encounter (Signed)
Patient states Melissa forgot to send in the diflucan since she is taking antibiotic. Patient was seeing by Earlie Counts.   [place], [facility]  [place], [place]  [address]  Phone:  [phone] Fax:  [phone]

## 2020-09-09 NOTE — Telephone Encounter (Signed)
Error

## 2020-09-09 NOTE — Progress Notes (Signed)
Virtual Visit via Video Note  I connected with Destiny Henderson on 09/09/20 at  3:40 PM EST by a video enabled telemedicine application and verified that I am speaking with the correct person using two identifiers.  Location: Patient: parked car Provider: home   I discussed the limitations of evaluation and management by telemedicine and the availability of in person appointments. The patient expressed understanding and agreed to proceed. Only the patient and myself were present for today's video call.   History of Present Illness:   Pt reports cough/chest congestion, watery eyes "like I have a cold."  She has had a booster shot. Took a rapid test before thanksgiving and it was negative.  Took another test 2 days ago and it was negative. She has also had a flu shot. She reports that cough began on 11/20.  She has been sweating at night but max temp was 99.  She has tried otc coricidin and theraflu.  She did not note much improvement. She denies sob.    Mild wheezing at night while sleeping. She went to an urgent care in [place] and had a reportedly negative cxr on Wednesday 09/07/20.    Observations/Objective:   Gen: Awake, alert, no acute distress Resp: Breathing is even and non-labored Psych: calm/pleasant demeanor Neuro: Alert and Oriented x 3, + facial symmetry, speech is clear.   Assessment and Plan:  Bronchitis- given duration of symptoms and lack of improvement, I advised pt to begin azithromycin 500mg  PO today then 250 mg daily x 4 more days.  She is having a lot of difficulty sleeping due to cough, so I also gave her an rx for cheratussin cough syrup. She understands not to take if she needs to work/drive as it may make her drowsy. During the day I recommended that she use delsym. She is advised to let us know if symptoms worsen or if symptoms are not improved in 3-4 days.    Follow Up Instructions:    I discussed the assessment and treatment plan with the patient.  The patient was provided an opportunity to ask questions and all were answered. The patient agreed with the plan and demonstrated an understanding of the instructions.   The patient was advised to call back or seek an in-person evaluation if the symptoms worsen or if the condition fails to improve as anticipated.  Nance Pear, NP

## 2020-09-12 MED ORDER — FLUCONAZOLE 150 MG PO TABS: ORAL_TABLET | ORAL | 0 refills | Status: DC

## 2020-09-12 MED FILL — FLUCONAZOLE 150 MG TABS: 150 | 2 days supply | Qty: 2 | Fill #0

## 2020-09-12 NOTE — Telephone Encounter (Signed)
Left detail message for patient to be aware that prescription was sent to her pharmacy as requested

## 2020-09-12 NOTE — Telephone Encounter (Signed)
Rx has been sent  

## 2020-10-21 ENCOUNTER — Ambulatory Visit: Payer: PRIVATE HEALTH INSURANCE | Admitting: Orthopaedic Surgery

## 2020-10-31 MED FILL — ACETAMINOPHEN-COD #3 TABLET: 300-30 | 7 days supply | Qty: 30 | Fill #1

## 2020-11-02 ENCOUNTER — Telehealth: Payer: Self-pay | Admitting: Cardiology

## 2020-11-02 ENCOUNTER — Telehealth: Payer: Self-pay | Admitting: Internal Medicine

## 2020-11-02 NOTE — Telephone Encounter (Signed)
Looks like encounter might have been opened in error. Closing encounter.

## 2020-11-02 NOTE — Telephone Encounter (Signed)
1) What problem are you experiencing?  She states she is not getting enough air   2) Who is your medical equipment company?  Per patient, [employer]    Please route to the sleep study assistant.

## 2020-11-04 ENCOUNTER — Ambulatory Visit: Payer: PRIVATE HEALTH INSURANCE | Admitting: Orthopaedic Surgery

## 2020-11-18 ENCOUNTER — Telehealth: Payer: Self-pay | Admitting: *Deleted

## 2020-11-18 NOTE — Telephone Encounter (Signed)
Returned a call to patient. She states that she is "not getting enough air." message sent to [employer] to link patient in airview. Patient informed that I will pull a download report and have Dr Claiborne Billings to review and make adjustments accordingly. Once this has been done I will call her to advise of his findings and recommendations. Patient also informed a compliance visit is needed. I will have the scheduler  To contact her for this. Patient agrees with the plan.

## 2020-12-01 ENCOUNTER — Telehealth: Payer: Self-pay | Admitting: *Deleted

## 2020-12-01 NOTE — Telephone Encounter (Signed)
[place] and spoke with Caryl Pina informing her that I will need to have patient linked to Dr Claiborne Billings in [place]. She tells me, same as the other [employer] staff in the past that they do not have the patient as getting a machine from them I told her the patient states that she came to their location and received the machine. Caryl Pina states that she will contact the patient to get the SN from the machine so that she can link her. I called and left the patient a message informing her to expect a call from [place] requesting this information.

## 2020-12-02 ENCOUNTER — Telehealth: Payer: Self-pay

## 2020-12-02 ENCOUNTER — Other Ambulatory Visit: Payer: Self-pay

## 2020-12-02 ENCOUNTER — Telehealth: Payer: PRIVATE HEALTH INSURANCE | Admitting: Medical

## 2020-12-02 NOTE — Telephone Encounter (Signed)
This is grounds for dismissal for abuse of staff/team members. I will see her Monday, but would like to d/c her. This type of behavior is unacceptable. Ty.

## 2020-12-02 NOTE — Progress Notes (Addendum)
   Subjective:    Patient ID: Destiny Henderson, female    DOB: 1961-01-01, 60 y.o.   MRN: [id number]  HPI  Very high bp dizzy, ha with potential neuro signs and symptoms. Advised pt to go to ED or uc. She also is in state of  and did not have  video connection making visual neuroloigic exam impossible.  Had Simonne Come RN. Talk and explain to pt as well.   I explained under the above scenario what recommendation was for her safety. Explained impossible to correctly evaluate with just phone visit. We were approcahing weekend as well.  I had asked where she was initially after seeing high bp and knowing she had ha with dizziness. I was [facility] so I could perform neurologic exam but she told me was in [place].  Location:        History of Present Illness: Pt bp is very high today. She was scheduled for virtual visit. Video link failed.     Observations/Objective:   Assessment and Plan:   Follow Up Instructions:    I discussed the assessment and treatment plan with the patient. The patient was provided an opportunity to ask questions and all were answered. The patient agreed with the plan and demonstrated an understanding of the instructions.   The patient was advised to call back or seek an in-person evaluation if the symptoms worsen or if the condition fails to improve as anticipated.     Mackie Pai, PA-C   Review of Systems     Objective:   Physical Exam        Assessment & Plan:

## 2020-12-02 NOTE — Telephone Encounter (Signed)
Per request of Mackie Pai, [place], spoke to patient regarding todays appointment.  Patient had virtual appointment for sinus pressure (negative COVID), however states her BP this morning was 188/100 and reports dizziness with headache.  Patient originally stated she is located in [place]. Patient was advised by Mackie Pai, PA to go to [facility] for symptoms.  She was also advised by this nurse to go to [facility] since she has symptoms and high BP. Patient began yelling and cursing stating she was not going to UC. When I explained we could not complete the visit if she is not in [place], she then yelled she was in [place] but her pharmacy is in [place].  Patient scheduled for f/u with PCP for Monday, 12/05/20. Advised to go to ER with symptoms before appointment. Patient yelled and cursed that she was not having symptoms, then hung up.

## 2020-12-05 ENCOUNTER — Encounter: Payer: Self-pay | Admitting: Family Medicine

## 2020-12-05 ENCOUNTER — Ambulatory Visit: Payer: PRIVATE HEALTH INSURANCE | Admitting: Family Medicine

## 2020-12-05 DIAGNOSIS — Z0289 Encounter for other administrative examinations: Secondary | ICD-10-CM

## 2020-12-07 ENCOUNTER — Telehealth: Payer: Self-pay | Admitting: Family Medicine

## 2020-12-07 NOTE — Telephone Encounter (Signed)
That's fine to schedule as we can address acute needs for the next 30 d. Ty.

## 2020-12-07 NOTE — Telephone Encounter (Signed)
Patient called requesting a appointment for runny nose and headache. Patient would like to know if she can be seen virtually. Patient has been dismissed from out department   Please advise

## 2020-12-08 ENCOUNTER — Telehealth (INDEPENDENT_AMBULATORY_CARE_PROVIDER_SITE_OTHER): Payer: PRIVATE HEALTH INSURANCE | Admitting: Family Medicine

## 2020-12-08 ENCOUNTER — Other Ambulatory Visit: Payer: Self-pay | Admitting: Family Medicine

## 2020-12-08 DIAGNOSIS — B379 Candidiasis, unspecified: Secondary | ICD-10-CM | POA: Diagnosis not present

## 2020-12-08 DIAGNOSIS — R519 Headache, unspecified: Secondary | ICD-10-CM | POA: Diagnosis not present

## 2020-12-08 DIAGNOSIS — R0981 Nasal congestion: Secondary | ICD-10-CM

## 2020-12-08 DIAGNOSIS — J309 Allergic rhinitis, unspecified: Secondary | ICD-10-CM

## 2020-12-08 MED ORDER — FLUCONAZOLE 150 MG PO TABS: 150.0000 mg | ORAL_TABLET | Freq: Once | ORAL | 0 refills | Status: DC

## 2020-12-08 MED ORDER — DOXYCYCLINE HYCLATE 100 MG PO TABS: 100.0000 mg | ORAL_TABLET | Freq: Two times a day (BID) | ORAL | 0 refills | Status: DC

## 2020-12-08 MED FILL — DOXYCYCLINE HYCLATE 100 MG: 100 | 10 days supply | Qty: 20 | Fill #0

## 2020-12-08 MED FILL — FLUCONAZOLE 150 MG TABS: 150 | 1 days supply | Qty: 1 | Fill #0

## 2020-12-08 NOTE — Patient Instructions (Signed)
-  I sent the medication(s) we discussed to your pharmacy: Meds ordered this encounter  Medications  . doxycycline (VIBRA-TABS) 100 MG tablet    Sig: Take 1 tablet (100 mg total) by mouth 2 (two) times daily.    Dispense:  20 tablet    Refill:  0  . fluconazole (DIFLUCAN) 150 MG tablet    Sig: Take 1 tablet (150 mg total) by mouth once for 1 dose.    Dispense:  1 tablet    Refill:  0   Consider retesting for Covid.  We are seeing frequent negative test results earlier on course of the illness with the new omicron variants.  Stay home while sick.  Take a daily antihistamine such as Allegra or Zyrtec, and Flonase 2 sprays each nostril daily nasal saline twice daily.  If your symptoms are worsening or are not improving with these measures please start the antibiotic, doxycycline.  I hope you are feeling better soon!  Seek in person care promptly if your symptoms worsen, new concerns arise or you are not improving with treatment.  It was nice to meet you today. I help [facility] out with telemedicine visits on Tuesdays and Thursdays and am available for visits on those days. If you have any concerns or questions following this visit please schedule a follow up visit with your Primary Care doctor or seek care at a local urgent care clinic to avoid delays in care.

## 2020-12-08 NOTE — Progress Notes (Signed)
Virtual Visit via Video Note  I connected with Ivalene  on 12/08/20 at 12:20 PM EST by a video enabled telemedicine application and verified that I am speaking with the correct person using two identifiers.  Location patient: home, [place] Location provider:work or home office Persons participating in the virtual visit: patient, provider  I discussed the limitations of evaluation and management by telemedicine and the availability of in person appointments. The patient expressed understanding and agreed to proceed.   HPI:  Acute telemedicine visit for : -Onset: has had bad nasal congestion with allergies for several weeks, but worse the last 5 days ago -home covid test 2 days ago was negative -Symptoms include: nasal congestion, cough, green mucus, eye watery, sinus pain and pressure -Denies:fevers, SOB, CP, NVD, loss of taste or smell, inability to eat/drink/get out of bed -Has tried: musinex, therflu -Pertinent past medical history: seasonal allergies   -Pertinent medication allergies: Latex, lisinopril, lasix, prednisone, sulfa -no chance of pregnancy -COVID-19 vaccine status: vaccinated x 3, had flu shot -She feels an antibiotic is likely needed, however she gets vaginal yeast and Diflucan as needed.  She request this medication.  ROS: See pertinent positives and negatives per HPI.  Past Medical History:  Diagnosis Date  . Amenorrhea, secondary 05/12/2019   Probable menopause  >>  FSH  42.6  (+ menopause)  . Anemia   . Atypical chest pain 05/21/2018  . Benign tumor of bladder   . Dizziness 11/24/2018  . DVT (deep venous thrombosis) ([place])   . Essential hypertension, benign 03/03/2013  . Fatigue 10/23/2019  . Fibroma of heart   . Hepatitis B immune 08/01/2019  . History of DVT (deep vein thrombosis) 02/06/2018  . History of iron deficiency anemia 05/21/2018  . IUD (intrauterine device) in place 06/03/2019   Located low in uterus 2 small fibroids  Note from 05/20/13: Needs IUD changed in  05/2014. (so was probably placed in 2010)  . LLQ pain 05/12/2019   US Showeed absent right ovary Unable to see Left ovary Fibroid uterus (2 small ones) with IUD low in Uterus FSH showed pt is in menopause May take IUD out if desires  . Low back pain radiating to right leg 02/17/2018  . Menorrhagia 05/20/2013   Controlled with Mirena.  Probable submucosal fibroid.   . Mixed hyperlipidemia 05/21/2018  . Morbid obesity ([place]) 03/03/2013  . Obesity 03/03/2013  . OSA (obstructive sleep apnea) 11/23/2019  . Pedal edema 02/06/2018  . Rheumatoid arthritis ([place]) 10/23/2019  . Right knee injury, subsequent encounter 01/24/2018  . Right wrist injury, subsequent encounter 01/24/2018  . Sprain of MCL (medial collateral ligament) of knee 12/12/2016  . Sprain of right ankle 12/12/2016  . Systemic lupus erythematosus ([place]) 10/23/2019    Past Surgical History:  Procedure Laterality Date  . BACK SURGERY    . bladder tack    . DILATION AND CURETTAGE OF UTERUS     after SAB  . HEART TUMOR EXCISION       Current Outpatient Medications:  .  doxycycline (VIBRA-TABS) 100 MG tablet, Take 1 tablet (100 mg total) by mouth 2 (two) times daily., Disp: 20 tablet, Rfl: 0 .  fluconazole (DIFLUCAN) 150 MG tablet, Take 1 tablet (150 mg total) by mouth once for 1 dose., Disp: 1 tablet, Rfl: 0 .  acetaminophen-codeine (TYLENOL #3) 300-30 MG tablet, Take 1 tablet by mouth every 6 (six) hours as needed for moderate pain., Disp: 30 tablet, Rfl: 2 .  azithromycin (ZITHROMAX) 250 MG  tablet, Take 2 tabs by mouth today, then one tablet once daily for 4 more days. (Patient not taking: Reported on 12/02/2020), Disp: 6 tablet, Rfl: 0 .  b complex vitamins capsule, Take 1 capsule by mouth daily., Disp: , Rfl:  .  CARTIA XT 300 MG 24 hr capsule, TAKE 1 CAPSULE (300 MG TOTAL) BY MOUTH DAILY., Disp: 90 capsule, Rfl: 2 .  Coenzyme Q10 (CO Q 10 PO), Take by mouth., Disp: , Rfl:  .  diclofenac (VOLTAREN) 75 MG EC tablet, Take 1 tablet (75 mg total) by  mouth 2 (two) times daily., Disp: 60 tablet, Rfl: 2 .  diclofenac Sodium (VOLTAREN) 1 % GEL, Apply 2 g topically 4 (four) times daily., Disp: 150 g, Rfl: 2 .  Echinacea 500 MG CAPS, Take by mouth., Disp: , Rfl:  .  fish oil-omega-3 fatty acids 1000 MG capsule, Take 2 g by mouth daily., Disp: , Rfl:  .  Flaxseed, Linseed, 1000 MG CAPS, Take by mouth., Disp: , Rfl:  .  furosemide (LASIX) 40 MG tablet, Take 1 tablet (40 mg total) by mouth daily., Disp: 90 tablet, Rfl: 1 .  gabapentin (NEURONTIN) 100 MG capsule, Take 1 capsule (100 mg total) by mouth 3 (three) times daily., Disp: 90 capsule, Rfl: 0 .  guaiFENesin-codeine (CHERATUSSIN AC) 100-10 MG/5ML syrup, Take 5 mLs by mouth 3 (three) times daily as needed for cough., Disp: 75 mL, Rfl: 0 .  hydrALAZINE (APRESOLINE) 50 MG tablet, Take 1 tablet (50 mg total) by mouth in the morning and at bedtime., Disp: 180 tablet, Rfl: 3 .  ibuprofen (ADVIL) 800 MG tablet, Take 1 tablet (800 mg total) by mouth every 8 (eight) hours as needed (pain)., Disp: 30 tablet, Rfl: 0 .  Melatonin 5 MG CAPS, Take by mouth as needed. , Disp: , Rfl:  .  Multiple Vitamins-Minerals (MEGA MULTIVITAMIN FOR WOMEN) TABS, Take by mouth., Disp: , Rfl:  .  naproxen (NAPROSYN) 500 MG tablet, Take 1 tablet (500 mg total) by mouth 2 (two) times daily as needed., Disp: 20 tablet, Rfl: 3 .  nortriptyline (PAMELOR) 10 MG capsule, Take 1 capsule (10 mg total) by mouth at bedtime., Disp: 30 capsule, Rfl: 3 .  orlistat (XENICAL) 120 MG capsule, Take 1 capsule (120 mg total) by mouth 3 (three) times daily with meals., Disp: 90 capsule, Rfl: 1 .  OZEMPIC, 1 MG/DOSE, 4 MG/3ML SOPN, Inject 1 mg as directed every 7 (seven) days., Disp: , Rfl:  .  potassium chloride (K-DUR) 10 MEQ tablet, Take 1 tablet (10 mEq total) by mouth daily., Disp: 90 tablet, Rfl: 1 .  Pyridoxine HCl (VITAMIN B-6) 500 MG tablet, Take 500 mg by mouth daily., Disp: , Rfl:  .  spironolactone (ALDACTONE) 25 MG tablet, Take 0.5  tablets (12.5 mg total) by mouth daily., Disp: 30 tablet, Rfl: 1 .  telmisartan-hydrochlorothiazide (MICARDIS HCT) 80-25 MG tablet, Take 1 tablet by mouth daily., Disp: 90 tablet, Rfl: 1 .  VITAMIN A OP, Apply to eye., Disp: , Rfl:  .  vitamin B-12 (CYANOCOBALAMIN) 1000 MCG tablet, Take 1,000 mcg by mouth daily., Disp: , Rfl:  .  vitamin C (ASCORBIC ACID) 500 MG tablet, Take 500 mg by mouth daily., Disp: , Rfl:  .  VITAMIN D, CHOLECALCIFEROL, PO, Take by mouth., Disp: , Rfl:  .  Vitamin D, Ergocalciferol, (DRISDOL) 1.25 MG (50000 UNIT) CAPS capsule, Take 1 capsule (50,000 Units total) by mouth every 7 (seven) days., Disp: 12 capsule, Rfl: 0  EXAM:  VITALS per patient if applicable:  GENERAL: alert, oriented, appears well and in no acute distress  HEENT: atraumatic, conjunttiva clear, no obvious abnormalities on inspection of external nose and ears  NECK: normal movements of the head and neck  LUNGS: on inspection no signs of respiratory distress, breathing rate appears normal, no obvious gross SOB, gasping or wheezing  CV: no obvious cyanosis  MS: moves all visible extremities without noticeable abnormality  PSYCH/NEURO: pleasant and cooperative, no obvious depression or anxiety, speech and thought processing grossly intact  ASSESSMENT AND PLAN:  Discussed the following assessment and plan:  Nasal congestion  Facial discomfort  Allergic rhinitis, unspecified seasonality, unspecified trigger  Candidiasis  -we discussed possible serious and likely etiologies, options for evaluation and workup, limitations of telemedicine visit vs in person visit, treatment, treatment risks and precautions. Pt prefers to treat via telemedicine empirically rather than in person at this moment.  Query developing bacterial sinusitis given several weeks of symptoms with worsening, versus new acute viral illness over Chronic allergies versus other.  Did discuss possibility of Covid with false negative  testing, given recent frequent false negative test results with the new variance of Covid.  Advised Covid retest.  Opted to bolster her allergy regimen with antihistamine, nasal saline and INS.  Also sent Rx for antibiotic in case worsening or not improving on an improved allergy regimen over the next several days. She also reports infection when taking antibiotics and requests Diflucan.  Rx sent. Advised to seek prompt in person care if worsening, new symptoms arise, or if is not improving with treatment. Discussed options for inperson care if PCP office not available. Did let this patient know that I only do telemedicine on Tuesdays and Thursdays for [place]. Advised to schedule follow up visit with PCP or [facility] if any further questions or concerns to avoid delays in care.   I discussed the assessment and treatment plan with the patient. The patient was provided an opportunity to ask questions and all were answered. The patient agreed with the plan and demonstrated an understanding of the instructions.     Lucretia Kern, DO

## 2020-12-27 ENCOUNTER — Telehealth: Payer: Self-pay

## 2020-12-27 NOTE — Telephone Encounter (Signed)
Patient called today and wanted explanation behind the reason for dismissal letter. Patient was very upset about the dismissal and advised she wanted to hear the "recorded" call from 12/02/20, the date she was told she could not have a visit with Mackie Pai, PA, since she was not located in [place] and was advised to seek medical attention at either an urgent care or ER in the state of [place], since patient was in that state at the time of the telephone call.  Patient advised me she was recording the phone call and she also advised me she had someone with her listening to our conversation.  Patient stated the information documented in her chart is not true and she wants her side to be heard.  I read to the patient the documentation/resaoning behind the decision for dismissal.  She stated she still had an appointment for her physical in April.  I advised patient her primary care provider could only see her for acute visits 30 days from the date of the dismissal letter.  Patient then stated, "well I have an ear infection."  I stated to the patient since that was an acute need she may want to get that seen about as soon as possible and offered her an appointment options as soon as tomorrow with times of 12:45 pm, 1:45 pm & 2:00.  Patient then said nevermind, she was just advised not to come here and would go to urgent care.  At the end of the call she asked for my name and the practice administrator's name which I provided both to her.

## 2020-12-30 NOTE — Telephone Encounter (Signed)
We will need to obtain download to see what pressures settings are to adjust

## 2021-01-02 NOTE — Telephone Encounter (Signed)
I have made multiple attempts to get this done. [employer] keeps telling me that they do not have her in their system. She insists that she received the machine from their office on [address]. When they try to reach out to her to possibly bring the machine to them to correct the issue they cannot make contact. I have spoken with the patient and told her that we cannot adjust her pressures until we know what pressures she is on and what her Hollace Kinnier is reading. This cannot be done until she either takes the machine to [employer] or [employer] can get her linked into the airview system.

## 2021-01-06 ENCOUNTER — Encounter: Payer: PRIVATE HEALTH INSURANCE | Admitting: Family Medicine

## 2021-01-25 ENCOUNTER — Other Ambulatory Visit (HOSPITAL_BASED_OUTPATIENT_CLINIC_OR_DEPARTMENT_OTHER): Payer: Self-pay

## 2021-01-25 MED FILL — Acetaminophen w/ Codeine Tab 300-30 MG: ORAL | 7 days supply | Qty: 28 | Fill #0 | Status: AC

## 2021-01-25 MED FILL — Doxycycline Hyclate Tab 100 MG: ORAL | 10 days supply | Qty: 20 | Fill #0 | Status: AC

## 2021-03-29 ENCOUNTER — Telehealth: Payer: Self-pay | Admitting: Internal Medicine

## 2021-03-30 NOTE — Telephone Encounter (Signed)
Nothing noted in message. Will close encounter.  

## 2021-04-03 ENCOUNTER — Telehealth: Payer: Self-pay | Admitting: *Deleted

## 2021-04-03 NOTE — Telephone Encounter (Signed)
Left message to return a call to me. Checking to see if the problem with her CPAP machine has been resolved.

## 2021-04-11 ENCOUNTER — Telehealth: Payer: Self-pay

## 2021-04-11 NOTE — Telephone Encounter (Signed)
Pt called stating she has blood in her urine, she states she was [place] in the ED out state and was told that she doesn't have a UTI. Pt is scheduled for an OV on 04/17/21 but wants to be seen sooner. Pt made aware that we can not get her scheduled sooner but she can go to the ED if symptoms worsen. Understanding was voiced. Dakai Braithwaite l Kynslie Ringle, CMA

## 2021-04-17 ENCOUNTER — Other Ambulatory Visit (HOSPITAL_BASED_OUTPATIENT_CLINIC_OR_DEPARTMENT_OTHER): Payer: Self-pay

## 2021-04-17 ENCOUNTER — Other Ambulatory Visit: Payer: Self-pay

## 2021-04-17 ENCOUNTER — Ambulatory Visit (INDEPENDENT_AMBULATORY_CARE_PROVIDER_SITE_OTHER): Payer: No Typology Code available for payment source | Admitting: Obstetrics & Gynecology

## 2021-04-17 ENCOUNTER — Encounter: Payer: Self-pay | Admitting: Obstetrics & Gynecology

## 2021-04-17 ENCOUNTER — Other Ambulatory Visit (HOSPITAL_COMMUNITY)
Admission: RE | Admit: 2021-04-17 | Discharge: 2021-04-17 | Disposition: A | Discharge status: Home / Self Care | Payer: No Typology Code available for payment source | Source: Ambulatory Visit | Attending: Obstetrics & Gynecology | Admitting: Obstetrics & Gynecology

## 2021-04-17 VITALS — BP 155/79 | HR 68 | Wt 228.0 lb

## 2021-04-17 DIAGNOSIS — Z202 Contact with and (suspected) exposure to infections with a predominantly sexual mode of transmission: Secondary | ICD-10-CM

## 2021-04-17 DIAGNOSIS — Z113 Encounter for screening for infections with a predominantly sexual mode of transmission: Secondary | ICD-10-CM | POA: Insufficient documentation

## 2021-04-17 DIAGNOSIS — Z124 Encounter for screening for malignant neoplasm of cervix: Secondary | ICD-10-CM | POA: Diagnosis present

## 2021-04-17 DIAGNOSIS — B379 Candidiasis, unspecified: Secondary | ICD-10-CM

## 2021-04-17 DIAGNOSIS — N3001 Acute cystitis with hematuria: Secondary | ICD-10-CM

## 2021-04-17 DIAGNOSIS — R319 Hematuria, unspecified: Secondary | ICD-10-CM

## 2021-04-17 DIAGNOSIS — R87611 Atypical squamous cells cannot exclude high grade squamous intraepithelial lesion on cytologic smear of cervix (ASC-H): Secondary | ICD-10-CM | POA: Insufficient documentation

## 2021-04-17 MED ORDER — FLUCONAZOLE 150 MG PO TABS
150.0000 mg | ORAL_TABLET | Freq: Once | ORAL | 0 refills | Status: DC
Start: 2021-04-17 — End: 2021-04-17
  Filled 2021-04-17: qty 1, 1d supply, fill #0

## 2021-04-17 MED ORDER — FLUCONAZOLE 150 MG PO TABS
150.0000 mg | ORAL_TABLET | Freq: Once | ORAL | 0 refills | Status: AC
  Filled 2021-04-17: qty 1, 1d supply, fill #0

## 2021-04-17 MED ORDER — DOXYCYCLINE HYCLATE 100 MG PO CAPS
100.0000 mg | ORAL_CAPSULE | Freq: Two times a day (BID) | ORAL | 1 refills | Status: AC
Start: 2021-04-17 — End: 2021-04-24
  Filled 2021-04-17: qty 14, 7d supply, fill #0

## 2021-04-17 NOTE — Progress Notes (Signed)
GYNECOLOGY OFFICE VISIT NOTE  History:   Destiny Henderson is a 60 y.o. [phone] here today for evaluation of blood in her urine for two weeks.  Also just learned that her partner has chlamydia.  Reports abdominal pain associated with urination. She also reports having a yeast infection, desires treatment. She denies any abnormal vaginal bleeding or other concerns.    Past Medical History:  Diagnosis Date   Amenorrhea, secondary 05/12/2019   Probable menopause  >>  FSH  42.6  (+ menopause)   Anemia    Atypical chest pain 05/21/2018   Benign tumor of bladder    Dizziness 11/24/2018   DVT (deep venous thrombosis) ([place])    Essential hypertension, benign 03/03/2013   Fatigue 10/23/2019   Fibroma of heart    Hepatitis B immune 08/01/2019   History of DVT (deep vein thrombosis) 02/06/2018   History of iron deficiency anemia 05/21/2018   IUD (intrauterine device) in place 06/03/2019   Located low in uterus 2 small fibroids  Note from 05/20/13: Needs IUD changed in 05/2014. (so was probably placed in 2010)   LLQ pain 05/12/2019   US Showeed absent right ovary Unable to see Left ovary Fibroid uterus (2 small ones) with IUD low in Uterus FSH showed pt is in menopause May take IUD out if desires   Low back pain radiating to right leg 02/17/2018   Menorrhagia 05/20/2013   Controlled with Mirena.  Probable submucosal fibroid.    Mixed hyperlipidemia 05/21/2018   Morbid obesity ([place]) 03/03/2013   Obesity 03/03/2013   OSA (obstructive sleep apnea) 11/23/2019   Pedal edema 02/06/2018   Rheumatoid arthritis ([place]) 10/23/2019   Right knee injury, subsequent encounter 01/24/2018   Right wrist injury, subsequent encounter 01/24/2018   Sprain of MCL (medial collateral ligament) of knee 12/12/2016   Sprain of right ankle 12/12/2016   Systemic lupus erythematosus ([place]) 10/23/2019    Past Surgical History:  Procedure Laterality Date   BACK SURGERY     bladder tack     DILATION AND CURETTAGE OF UTERUS     after SAB    HEART TUMOR EXCISION      The following portions of the patient's history were reviewed and updated as appropriate: allergies, current medications, past family history, past medical history, past social history, past surgical history and problem list.   Health Maintenance:  Normal pap and negative HRHPV on 02/05/2018.  Normal mammogram on 02/24/2021 done at [facility] (report in [place]).   Review of Systems:  Pertinent items noted in HPI and remainder of comprehensive ROS otherwise negative.  Physical Exam:  BP (!) 155/79   Pulse 68   Wt 228 lb (103.4 kg)   BMI 37.94 kg/m  CONSTITUTIONAL: Well-developed, well-nourished female in no acute distress.  HEENT:  Normocephalic, atraumatic. External right and left ear normal. No scleral icterus.  NECK: Normal range of motion, supple, no masses noted on observation SKIN: No rash noted. Not diaphoretic. No erythema. No pallor. MUSCULOSKELETAL: Normal range of motion. No edema noted. NEUROLOGIC: Alert and oriented to person, place, and time. Normal muscle tone coordination. No cranial nerve deficit noted. PSYCHIATRIC: Normal mood and affect. Normal behavior. Normal judgment and thought content. CARDIOVASCULAR: Normal heart rate noted RESPIRATORY: Effort and breath sounds normal, no problems with respiration noted ABDOMEN: No masses noted. No other overt distention noted.  Mild suprapubic tenderness PELVIC: Normal appearing external genitalia; normal urethral meatus; normal appearing vaginal mucosa and cervix. No evidence of blood  in vagina.  Scant Alpert discharge noted, testing sample obtained.  Pap smear also obtained. Normal uterine size, no other palpable masses, no uterine or adnexal tenderness. Performed in the presence of a chaperone     Assessment and Plan:    1. Hematuria, unspecified type POCT urinalysis dipstick showed moderate blood and LE. No nitrates - Urine Culture sent, will follow up results and manage accordingly.  2.  Exposure to chlamydia Testing done, will also treat presumptively. - Cervicovaginal ancillary only( [place]) - doxycycline (VIBRAMYCIN) 100 MG capsule; Take 1 capsule (100 mg total) by mouth 2 (two) times daily for 7 days.  Dispense: 14 capsule; Refill: 1  3. Routine screening for STI (sexually transmitted infection) STI screen done, will follow up results and manage accordingly. Patient and sex partner(s) should abstain from unprotected sexual activity for seven days after everyone receives appropriate treatment.  She was advised to practice safe sex at all times. - Cervicovaginal ancillary only( ) - Hepatitis B surface antigen - Hepatitis C antibody - RPR - HIV Antibody (routine testing w rflx)  4. Pap smear for cervical cancer screening - Cytology - PAP done, will follow up results and manage accordingly.  5. Yeast infection - fluconazole (DIFLUCAN) 150 MG tablet; Take 1 tablet (150 mg total) by mouth once for 1 dose.  Dispense: 1 tablet; Refill: 0   Return for any gynecologic concerns.    I spent 20 minutes dedicated to the care of this patient including pre-visit review of records, face to face time with the patient discussing her conditions and treatments and post visit ordering of testing.    Verita Schneiders, MD, [place] [facility] [employer], [place]

## 2021-04-18 LAB — CERVICOVAGINAL ANCILLARY ONLY
Bacterial Vaginitis (gardnerella): NEGATIVE
Candida Glabrata: NEGATIVE
Candida Vaginitis: NEGATIVE
Chlamydia: NEGATIVE
Comment: NEGATIVE
Comment: NEGATIVE
Comment: NEGATIVE
Comment: NEGATIVE
Comment: NEGATIVE
Comment: NORMAL
Neisseria Gonorrhea: NEGATIVE
Trichomonas: NEGATIVE

## 2021-04-18 LAB — HEPATITIS C ANTIBODY: Hep C Virus Ab: <0.1 s/co ratio (ref 0.0–0.9)

## 2021-04-18 LAB — HEPATITIS B SURFACE ANTIGEN: Hepatitis B Surface Ag: NEGATIVE

## 2021-04-18 LAB — RPR: RPR Ser Ql: NONREACTIVE

## 2021-04-18 LAB — HIV ANTIBODY (ROUTINE TESTING W REFLEX): HIV Screen 4th Generation wRfx: NONREACTIVE

## 2021-04-19 ENCOUNTER — Telehealth: Payer: Self-pay

## 2021-04-19 NOTE — Telephone Encounter (Signed)
Pt called requesting lab results. Pt made aware that her STI swab and blood work was negative.  Understanding was voiced. Emmons Toth l Foster Sonnier, CMA

## 2021-04-20 ENCOUNTER — Other Ambulatory Visit (HOSPITAL_BASED_OUTPATIENT_CLINIC_OR_DEPARTMENT_OTHER): Payer: Self-pay

## 2021-04-20 LAB — URINE CULTURE

## 2021-04-20 MED ORDER — CEFADROXIL 500 MG PO CAPS
500.0000 mg | ORAL_CAPSULE | Freq: Two times a day (BID) | ORAL | 0 refills | Status: AC
  Filled 2021-04-20 – 2021-05-02 (×2): qty 10, 5d supply, fill #0

## 2021-04-20 NOTE — Addendum Note (Signed)
Addended by: Verita Schneiders A on: 04/20/2021 05:27 PM   Modules accepted: Orders

## 2021-04-21 ENCOUNTER — Other Ambulatory Visit (HOSPITAL_BASED_OUTPATIENT_CLINIC_OR_DEPARTMENT_OTHER): Payer: Self-pay

## 2021-04-27 ENCOUNTER — Encounter: Payer: Self-pay | Admitting: Obstetrics & Gynecology

## 2021-04-27 ENCOUNTER — Telehealth: Payer: Self-pay

## 2021-04-27 LAB — CYTOLOGY - PAP
Comment: NEGATIVE
Diagnosis: HIGH — AB
High risk HPV: NEGATIVE

## 2021-04-27 NOTE — Telephone Encounter (Signed)
-----   Message from Osborne Oman, MD sent at 04/27/2021  1:03 PM EDT ----- Please schedule patient for colposcopy for pap smear of cervix with ASCUS, cannot exclude HGSIL done on 04/17/2021.  Please call to inform patient of results and need for appointment.

## 2021-04-27 NOTE — Telephone Encounter (Signed)
Called pt to regarding PAP results and to schedule a Colposcopy. Pt made aware that her PAP smear was abnormal and she needs a Colposcopy. Pt also made aware that the next available appt is in September. Pt states since it isn't important, she can wait until December. Then she hung up the phone.  Destiny Henderson l Twanna Resh, CMA

## 2021-04-28 ENCOUNTER — Other Ambulatory Visit (HOSPITAL_BASED_OUTPATIENT_CLINIC_OR_DEPARTMENT_OTHER): Payer: Self-pay

## 2021-04-28 NOTE — Telephone Encounter (Signed)
Pt is scheduled for a Colposcopy on 05/05/21. Hershy Flenner l Dynver Clemson, CMA

## 2021-05-02 ENCOUNTER — Other Ambulatory Visit (HOSPITAL_BASED_OUTPATIENT_CLINIC_OR_DEPARTMENT_OTHER): Payer: Self-pay

## 2021-05-05 ENCOUNTER — Other Ambulatory Visit: Payer: Self-pay

## 2021-05-05 ENCOUNTER — Ambulatory Visit (INDEPENDENT_AMBULATORY_CARE_PROVIDER_SITE_OTHER): Payer: No Typology Code available for payment source | Admitting: Obstetrics & Gynecology

## 2021-05-05 ENCOUNTER — Encounter: Payer: Self-pay | Admitting: General Practice

## 2021-05-05 ENCOUNTER — Other Ambulatory Visit (HOSPITAL_BASED_OUTPATIENT_CLINIC_OR_DEPARTMENT_OTHER): Payer: Self-pay

## 2021-05-05 ENCOUNTER — Other Ambulatory Visit (HOSPITAL_COMMUNITY)
Admission: RE | Admit: 2021-05-05 | Discharge: 2021-05-05 | Disposition: A | Discharge status: Home / Self Care | Payer: No Typology Code available for payment source | Source: Ambulatory Visit | Attending: Obstetrics & Gynecology | Admitting: Obstetrics & Gynecology

## 2021-05-05 VITALS — BP 133/84 | HR 80 | Wt 228.0 lb

## 2021-05-05 DIAGNOSIS — B379 Candidiasis, unspecified: Secondary | ICD-10-CM | POA: Diagnosis not present

## 2021-05-05 DIAGNOSIS — R87611 Atypical squamous cells cannot exclude high grade squamous intraepithelial lesion on cytologic smear of cervix (ASC-H): Secondary | ICD-10-CM | POA: Insufficient documentation

## 2021-05-05 DIAGNOSIS — Z0000 Encounter for general adult medical examination without abnormal findings: Secondary | ICD-10-CM

## 2021-05-05 MED ORDER — FLUCONAZOLE 150 MG PO TABS
ORAL_TABLET | ORAL | 1 refills | Status: DC
Start: 2021-05-05 — End: 2022-04-26
  Filled 2021-05-05: qty 1, 1d supply, fill #0

## 2021-05-05 NOTE — Patient Instructions (Signed)
[email or web address]  Colposcopy, Care After This sheet gives you information about how to care for yourself after your procedure. Your health care provider may also give you more specific instructions. If you have problems or questions, contact your health careprovider. What can I expect after the procedure? If you had a colposcopy without a biopsy, you can expect to feel fine right away after your procedure. However, you may have some spotting of blood for afew days. You can return to your normal activities. If you had a colposcopy with a biopsy, it is common after the procedure to have: Soreness and mild pain. These may last for a few days. Light-headedness. Mild vaginal bleeding or discharge that is dark-colored and grainy. This may last for a few days. The discharge may be caused by a liquid (solution) that was used during the procedure. You may need to wear a sanitary pad during this time. Spotting of blood for at least 48 hours after the procedure. Follow these instructions at home: Medicines Take over-the-counter and prescription medicines only as told by your health care provider. Talk with your health care provider about what type of over-the-counter pain medicine and prescription medicine you can start to take again. It is especially important to talk with your health care provider if you take blood thinners. Activity Limit your physical activity for the first day after your procedure as told by your health care provider. Avoid using douche products, using tampons, or having sex for at least 3 days after the procedure or for as long as told. Return to your normal activities as told by your health care provider. Ask your health care provider what activities are safe for you. General instructions  Drink enough fluid to keep your urine pale yellow. Ask your health care provider if you may take baths, swim, or use a hot tub. You may take showers. If you use  birth control (contraception), continue to use it. Keep all follow-up visits as told by your health care provider. This is important.  Contact a health care provider if: You develop a skin rash. Get help right away if: You bleed a lot from your vagina or pass blood clots. This includes using more than one sanitary pad each hour for 2 hours in a row. You have a fever or chills. You have vaginal discharge that is abnormal, is yellow in color, or smells bad. This could be a sign of infection. You have severe pain or cramps in your lower abdomen that do not go away with medicine. You faint. Summary If you had a colposcopy without a biopsy, you can expect to feel fine right away, but you may have some spotting of blood for a few days. You can return to your normal activities. If you had a colposcopy with a biopsy, it is common to have mild pain for a few days and spotting for 48 hours after the procedure. Avoid using douche products, using tampons, and having sex for at least 3 days after the procedure or for as long as told by your health care provider. Get help right away if you have heavy bleeding, severe pain, or signs of infection. This information is not intended to replace advice given to you by your health care provider. Make sure you discuss any questions you have with your healthcare provider. Document Revised: 09/23/2019 Document Reviewed: 09/23/2019 Elsevier Patient Education  2022 [employer].

## 2021-05-05 NOTE — Progress Notes (Signed)
Patient given informed consent, signed copy in the chart, time out was performed.  Placed in lithotomy position. Cervix viewed with speculum and colposcope after application of acetic acid.  04/17/2021  Negative    Adequacy Satisfactory for evaluation; transformation zone component PRESENT.   Diagnosis - Atypical squamous cells, cannot exclude high grade squamous Abnormal    Diagnosis intraepithelial lesion (ASC-H) Abnormal    Comment Normal Reference Range HPV - Negative    Colposcopy adequate?  yes Acetowhite lesions? no Punctation? no Mosaicism?  no Abnormal vasculature?  no Biopsies? yes ECC? yes  Patient was given post procedure instructions.  She will return in 2 weeks for results.  Diagnoses and all orders for this visit:  Pap smear of cervix with ASCUS, cannot exclude HGSIL on 04/17/2021 -     Surgical pathology  Yeast infection -     fluconazole (DIFLUCAN) 150 MG tablet; Take 1 tablet by mouth. Repeat in 3 days if symptoms persists.  Well woman exam (no gynecological exam) -     CBC -     TSH -     Hemoglobin A1c -     Comprehensive metabolic panel -     Lipid panel  The labs were not done at her [facility] visit so were obtained today   Dashel Goines L. Harraway-Smith, M.D., [facility]

## 2021-05-06 LAB — CBC
Hematocrit: 40.7 % (ref 34.0–46.6)
Hemoglobin: 12.9 g/dL (ref 11.1–15.9)
MCH: 28.2 pg (ref 26.6–33.0)
MCHC: 31.7 g/dL (ref 31.5–35.7)
MCV: 89 fL (ref 79–97)
Platelets: 410 10*3/uL (ref 150–450)
RBC: 4.58 x10E6/uL (ref 3.77–5.28)
RDW: 14.7 % (ref 11.7–15.4)
WBC: 6.6 10*3/uL (ref 3.4–10.8)

## 2021-05-06 LAB — LIPID PANEL
Chol/HDL Ratio: 3.9 ratio (ref 0.0–4.4)
Cholesterol, Total: 213 mg/dL — ABNORMAL HIGH (ref 100–199)
HDL: 54 mg/dL (ref >39)
LDL Chol Calc (NIH): 143 mg/dL — ABNORMAL HIGH (ref 0–99)
Triglycerides: 91 mg/dL (ref 0–149)
VLDL Cholesterol Cal: 16 mg/dL (ref 5–40)

## 2021-05-06 LAB — HEMOGLOBIN A1C
Est. average glucose Bld gHb Est-mCnc: 111 mg/dL
Hgb A1c MFr Bld: 5.5 % (ref 4.8–5.6)

## 2021-05-06 LAB — COMPREHENSIVE METABOLIC PANEL
ALT: 17 IU/L (ref 0–32)
AST: 22 IU/L (ref 0–40)
Albumin/Globulin Ratio: 1.3 (ref 1.2–2.2)
Albumin: 4 g/dL (ref 3.8–4.9)
Alkaline Phosphatase: 69 IU/L (ref 44–121)
BUN/Creatinine Ratio: 9 (ref 9–23)
BUN: 10 mg/dL (ref 6–24)
Bilirubin Total: 0.3 mg/dL (ref 0.0–1.2)
CO2: 23 mmol/L (ref 20–29)
Calcium: 9.3 mg/dL (ref 8.7–10.2)
Chloride: 100 mmol/L (ref 96–106)
Creatinine, Ser: 1.15 mg/dL — ABNORMAL HIGH (ref 0.57–1.00)
Globulin, Total: 3.1 g/dL (ref 1.5–4.5)
Glucose: 91 mg/dL (ref 65–99)
Potassium: 4.9 mmol/L (ref 3.5–5.2)
Sodium: 140 mmol/L (ref 134–144)
Total Protein: 7.1 g/dL (ref 6.0–8.5)
eGFR: 55 mL/min/{1.73_m2} — ABNORMAL LOW (ref >59)

## 2021-05-06 LAB — TSH: TSH: 1.1 u[IU]/mL (ref 0.450–4.500)

## 2021-05-08 DIAGNOSIS — R1031 Right lower quadrant pain: Secondary | ICD-10-CM | POA: Diagnosis not present

## 2021-05-08 DIAGNOSIS — I878 Other specified disorders of veins: Secondary | ICD-10-CM | POA: Diagnosis not present

## 2021-05-08 DIAGNOSIS — R3129 Other microscopic hematuria: Secondary | ICD-10-CM | POA: Diagnosis not present

## 2021-05-08 LAB — SURGICAL PATHOLOGY

## 2021-05-09 ENCOUNTER — Telehealth: Payer: Self-pay

## 2021-05-09 NOTE — Telephone Encounter (Signed)
Pt called requesting Endometrial bx results. Pt made aware that she will receive a call after the provider reviews her results. Understanding was voiced. A message was sent to the provider. Airyn Ellzey l Nyia Tsao, CMA

## 2021-05-09 NOTE — Telephone Encounter (Signed)
Pt called requesting results of her Colpo. Pt made aware that she will receive a call after the doctor reviews her results. Understanding was voiced. Destiny Henderson l Amer Alcindor, CMA

## 2021-05-09 NOTE — Telephone Encounter (Signed)
Pt called back requesting lab results. Pt made aware that her her cholesterol is elevated and her hemoglobin is WNL. I advised pt to f/u with her PCP.Understanding was voiced. Syris Brookens l Jhair Witherington, CMA

## 2021-05-10 ENCOUNTER — Telehealth: Payer: Self-pay

## 2021-05-10 NOTE — Telephone Encounter (Signed)
Pt called the office back requesting Colpo results. Pt made aware that her Colpo showed low grade changes and it is recommended that she repeats her PAP in 1 year. Pt also made aware that her Cholesterol is elevated and she will need to f/u with her PCP, she states she doesn't have a PCP and she is requesting to have her Vitamin D labs drawn in this office.  A message will be sent to the provider. Understanding was voiced. Cristin Penaflor l Rital Cavey, CMA

## 2021-05-10 NOTE — Telephone Encounter (Signed)
Called patient and unable to leave message for her to return call to office. Left text notification. Kathrene Alu RN

## 2021-05-10 NOTE — Telephone Encounter (Signed)
-----   Message from Lavonia Drafts, MD sent at 05/10/2021  2:01 PM EDT ----- Regarding: FW: colpo results et al.  ----- Message ----- From: Lavonia Drafts, MD Sent: 05/10/2021   1:58 PM EDT To: Lavonia Drafts, MD Subject: colpo results et al.                           Please call pt. Her PAP shows low grade changes. She needs a repeat PAP in 1 year. NO other management needed at this time.   Her lipids were elevated and we need to refer her to primary care for further eval. She does not have a current primary care. Please send her an AVS on elevated chol diet.   Thanks,  Clh-S   ----- Message ----- From: Lavonia Drafts, MD Sent: 05/09/2021   2:09 PM EDT To: Lavonia Drafts, MD  Needs call or note RE labs

## 2021-05-10 NOTE — Telephone Encounter (Signed)
Patient called this morning wanting colposcopy results.  Informed Mrs Hubbs that we have sent them to the provider for review. She replied "I dont know why I have to wait so long for results"  I explained that we have to let the doctor review and give [name] the plan of care.  Re assured her that Dr. Ihor Dow would be in the office this afternoon and we would have an answer for her.  Patient states she has another obgyn and can that doctor see her results. I informed her the results are in her mychart but would be best for the treating physician should review and give her a plan of care and assured her we would call her back this afternoon with Dr. Casandra Doffing plan. Kathrene Alu RN

## 2021-05-11 ENCOUNTER — Ambulatory Visit: Payer: No Typology Code available for payment source

## 2021-05-22 ENCOUNTER — Other Ambulatory Visit: Payer: Self-pay | Admitting: Obstetrics & Gynecology

## 2021-05-22 DIAGNOSIS — E559 Vitamin D deficiency, unspecified: Secondary | ICD-10-CM

## 2021-06-14 ENCOUNTER — Ambulatory Visit: Payer: No Typology Code available for payment source | Admitting: Obstetrics & Gynecology

## 2021-07-10 ENCOUNTER — Ambulatory Visit: Payer: No Typology Code available for payment source | Admitting: Obstetrics & Gynecology

## 2021-08-14 ENCOUNTER — Ambulatory Visit: Payer: No Typology Code available for payment source | Admitting: Obstetrics & Gynecology

## 2021-09-18 ENCOUNTER — Telehealth: Payer: Self-pay

## 2021-09-18 ENCOUNTER — Telehealth: Payer: Self-pay | Admitting: *Deleted

## 2021-09-18 ENCOUNTER — Other Ambulatory Visit: Payer: Self-pay

## 2021-09-18 ENCOUNTER — Encounter: Payer: Self-pay | Admitting: Obstetrics & Gynecology

## 2021-09-18 ENCOUNTER — Other Ambulatory Visit (HOSPITAL_COMMUNITY)
Admission: RE | Admit: 2021-09-18 | Discharge: 2021-09-18 | Disposition: A | Discharge status: Home / Self Care | Payer: No Typology Code available for payment source | Source: Ambulatory Visit | Attending: Obstetrics & Gynecology | Admitting: Obstetrics & Gynecology

## 2021-09-18 ENCOUNTER — Ambulatory Visit (INDEPENDENT_AMBULATORY_CARE_PROVIDER_SITE_OTHER): Payer: No Typology Code available for payment source | Admitting: Obstetrics & Gynecology

## 2021-09-18 VITALS — BP 157/84 | HR 76 | Ht 64.0 in | Wt 231.0 lb

## 2021-09-18 DIAGNOSIS — Z113 Encounter for screening for infections with a predominantly sexual mode of transmission: Secondary | ICD-10-CM | POA: Insufficient documentation

## 2021-09-18 DIAGNOSIS — N950 Postmenopausal bleeding: Secondary | ICD-10-CM | POA: Diagnosis not present

## 2021-09-18 NOTE — Telephone Encounter (Signed)
Patient called and spoke with front desk and was transferred to me due to how she was speaking with front desk representative, [place].  Patient called me and was upset that she was on hold for a long time (when I picked up the call she was on hold for 8 minutes- due to patients checking in and out).  Patient states that she needs appointment for follow up from emergency room visit for abdominal pain. Patient made aware that Dr. Ihor Dow doesn't have any openings this week. Patient states that she would like to be seen at another office and gave her [facility] office number as she requested, but then she states she already called there and they gave her our number. Patient then states " I dont think you understand" and we have lengthy conversation about her signing a records release because she has an appointment with an OBGYN in [place] later today- that she made while on hold with our office. Patient advised to speak with Mellody Dance, office manager due to front desk staff not comfortable with how patient speaks to her. Janit Pagan Tamala Julian has already spoken with this patient previously on how she treats our office staff- pt states that it was probably her sister who actually spoke with Mellody Dance). Gave patient Mellody Dance phone number and she states she already has spoken with her. Transferred patient to Azzie Glatter RN

## 2021-09-18 NOTE — Progress Notes (Signed)
   Subjective:    Patient ID: Destiny Henderson, female    DOB: Mar 12, 1961, 60 y.o.   MRN: [id number]  HPI Destiny Henderson is a 60 year old female who presents to our office for follow-up of vaginal bleeding and abdominal pain.  Patient was seen in the ED at [facility] and has the below CT abdomen pelvis with contrast results. DATE OF SERVICE:  08/18/2021 5:32 pm   EXAM:  CT ABDOMEN AND PELVIS WITH IV CONTRAST   CLINICAL HISTORY:  Abdominal pain, acute, nonlocalized, Unspecified abdominal pain   COMPARISON:  No Relevant Comparisons   TECHNIQUE:  Following administration of IV contrast, axial images were acquired through the abdomen and pelvis. If this exam was performed in conjunction with another study in the same setting, the volume of IV contrast dictated in each report was the total volume of contrast administered for the exams.   Dose lowering technique(s) such as automated exposure control, iterative reconstruction, and mA and/or KV adjustment for patient size was utilized for this exam.   CONTRAST:   120 cc of Omnipaque 350   FINDINGS:  LOWER CHEST: Areas of calcified tree-in-bud nodularity noted in the right lower lobe.   LIVER: Unremarkable.   GALLBLADDER AND BILIARY TREE: Unremarkable.   PANCREAS: Unremarkable.   SPLEEN: Unremarkable.   ADRENAL GLANDS: Unremarkable.   KIDNEYS AND URETERS: Unremarkable.   BOWEL: Focal area of bowel under distension and apparent thickening just proximal to the cecum (3-62) with trace stranding in a few rounded lymph nodes (3-68) measuring up to 11 mm. The appendix is normal. There is no bowel obstruction. A few scattered diverticula including at the ascending colon without inflammation.   LYMPH NODES: As above   PERITONEUM/MESENTERY/OMENTUM: Unremarkable.   BLADDER: Unremarkable.   REPRODUCTIVE ORGANS: Unremarkable.   VASCULATURE: Unremarkable.   BONES AND SOFT TISSUES: Minimal degenerative changes of the lumbar  spine.   Review of Systems  Constitutional: Negative.   Respiratory: Negative.    Cardiovascular: Negative.   Gastrointestinal: Negative.   Genitourinary: Negative.       Objective:   Physical Exam Vitals reviewed.  Constitutional:      General: She is not in acute distress.    Appearance: She is well-developed.  HENT:     Head: Normocephalic and atraumatic.  Eyes:     Conjunctiva/sclera: Conjunctivae normal.  Cardiovascular:     Rate and Rhythm: Normal rate.  Pulmonary:     Effort: Pulmonary effort is normal.  Skin:    General: Skin is warm and dry.  Neurological:     Mental Status: She is alert and oriented to person, place, and time.  Psychiatric:        Mood and Affect: Mood normal.   Vitals:   09/18/21 1558  BP: (!) 157/84  Pulse: 76  Weight: 231 lb (104.8 kg)  Height: 5\' 4"  (1.626 m)       Assessment & Plan:  60 year old female with menopausal bleeding and abdominal pain.  STD testing vaginal and serum Pelvic ultrasound complete with transvaginal.  The CT at [facility] did not show any abnormalities in the reproductive system which is very reassuring for ovarian or uterine masses.  Patient will get a ultrasound to evaluate the endometrium and the uterus more closely.  30 mins spent during encounter including review of [place] and outside records, history and physical, counseling and coordination of care.

## 2021-09-18 NOTE — Telephone Encounter (Signed)
Office asked patient if she could come in at 3:30 instead of 4:10 PM for scheduled appointment per [facility]. Patient stated that she will be coming from [place] and not able to arrive until scheduled appointment at 4:10 PM. Patient advised if she is able to arrive early that she can. Patient agreed.

## 2021-09-20 LAB — CERVICOVAGINAL ANCILLARY ONLY
Chlamydia: NEGATIVE
Comment: NEGATIVE
Comment: NEGATIVE
Comment: NORMAL
Neisseria Gonorrhea: NEGATIVE
Trichomonas: NEGATIVE

## 2021-09-21 ENCOUNTER — Other Ambulatory Visit: Payer: No Typology Code available for payment source

## 2021-09-27 ENCOUNTER — Other Ambulatory Visit: Payer: No Typology Code available for payment source

## 2021-10-06 ENCOUNTER — Encounter: Payer: Self-pay | Admitting: Obstetrics & Gynecology

## 2021-10-06 ENCOUNTER — Other Ambulatory Visit: Payer: No Typology Code available for payment source

## 2021-10-19 ENCOUNTER — Other Ambulatory Visit: Payer: No Typology Code available for payment source

## 2021-10-23 ENCOUNTER — Ambulatory Visit (HOSPITAL_BASED_OUTPATIENT_CLINIC_OR_DEPARTMENT_OTHER): Payer: No Typology Code available for payment source

## 2021-10-23 ENCOUNTER — Telehealth (HOSPITAL_BASED_OUTPATIENT_CLINIC_OR_DEPARTMENT_OTHER): Payer: Self-pay

## 2022-01-02 ENCOUNTER — Telehealth: Payer: Self-pay | Admitting: Orthopaedic Surgery

## 2022-01-02 NOTE — Telephone Encounter (Signed)
Patient called advised she fell today and hurt her upper and lower back. Patient also said her shoulders are hurting as well. Patient asked if she can be worked into dr. Phoebe Sharps schedule? The number to contact patient is [phone].  ?

## 2022-01-03 NOTE — Telephone Encounter (Signed)
Sure Thursday is fine.  Thanks.

## 2022-01-03 NOTE — Telephone Encounter (Signed)
Called patient no answer LMOM.

## 2022-01-03 NOTE — Telephone Encounter (Signed)
Called patient no answer.

## 2022-01-04 ENCOUNTER — Telehealth: Payer: Self-pay | Admitting: Orthopaedic Surgery

## 2022-01-04 NOTE — Telephone Encounter (Signed)
Patient called asked if she can be worked into Dr. Erlinda Hong schedule for a sooner appointment on a different day? Patient was very rude and asked if she can be worked into [facility] schedule because she is having pain in her back.  The number to contact patient is [phone] ?

## 2022-01-04 NOTE — Telephone Encounter (Signed)
Called patient back no answer LMOM.

## 2022-01-04 NOTE — Telephone Encounter (Signed)
Called patient again no answer LMOM. ? ?

## 2022-01-10 ENCOUNTER — Ambulatory Visit: Payer: No Typology Code available for payment source | Admitting: Orthopaedic Surgery

## 2022-01-19 ENCOUNTER — Telehealth: Payer: Self-pay | Admitting: Orthopaedic Surgery

## 2022-01-19 NOTE — Telephone Encounter (Signed)
Holding for [employer] . Per chart, patient no showed appointment on 01/10/2022. ?

## 2022-01-19 NOTE — Telephone Encounter (Signed)
Patient called asked if she can be worked into Dr Phoebe Sharps schedule on 12/23/2021. Patient said her back is really hurting her. The number to contact patient is [phone]  ?

## 2022-01-23 NOTE — Telephone Encounter (Signed)
She can come at next open availability

## 2022-01-23 NOTE — Telephone Encounter (Signed)
Please schedule next availability. Thank you. ? ? ?

## 2022-01-23 NOTE — Telephone Encounter (Signed)
See message below °

## 2022-01-24 ENCOUNTER — Telehealth: Payer: Self-pay | Admitting: Physician Assistant

## 2022-01-24 NOTE — Telephone Encounter (Signed)
Called patient left message to return call to reschedule her appointment for next available with Dr. Erlinda Hong or Mendel Ryder.  [phone] ?

## 2022-04-05 NOTE — Progress Notes (Deleted)
Office Visit Note   Patient: Destiny Henderson  PAONE           Date of Birth: July 01, 1961           MRN: [id number] Visit Date: 04/06/2022              Requested by: Judson Roch, [address] [place],  [place] [address] PCP: Judson Roch, FNP   Assessment & Plan: Visit Diagnoses: No diagnosis found.  Plan: ***  Follow-Up Instructions: No follow-ups on file.   Orders:  No orders of the defined types were placed in this encounter.  No orders of the defined types were placed in this encounter.     Procedures: No procedures performed   Clinical Data: No additional findings.   Subjective: No chief complaint on file.   HPI  Review of Systems  Constitutional: Negative.   HENT: Negative.    Eyes: Negative.   Respiratory: Negative.    Cardiovascular: Negative.   Endocrine: Negative.   Musculoskeletal: Negative.   Neurological: Negative.   Hematological: Negative.   Psychiatric/Behavioral: Negative.    All other systems reviewed and are negative.   Objective: Vital Signs: LMP  (LMP Unknown)   Physical Exam Vitals and nursing note reviewed.  Constitutional:      Appearance: She is well-developed.  HENT:     Head: Atraumatic.     Nose: Nose normal.  Eyes:     Extraocular Movements: Extraocular movements intact.  Cardiovascular:     Pulses: Normal pulses.  Pulmonary:     Effort: Pulmonary effort is normal.  Abdominal:     Palpations: Abdomen is soft.  Musculoskeletal:     Cervical back: Neck supple.  Skin:    General: Skin is warm.     Capillary Refill: Capillary refill takes less than 2 seconds.  Neurological:     Mental Status: She is alert. Mental status is at baseline.  Psychiatric:        Behavior: Behavior normal.        Thought Content: Thought content normal.        Judgment: Judgment normal.   Ortho Exam  Specialty Comments:  No specialty comments available.  Imaging: No results found.   PMFS  History: Patient Active Problem List   Diagnosis Date Noted  . Pap smear of cervix with ASCUS, cannot exclude HGSIL on 04/17/2021 04/17/2021  . Fibroma of heart   . DVT (deep venous thrombosis) ([place])   . Benign tumor of bladder   . Anemia   . Syncope and collapse 12/31/2019  . OSA (obstructive sleep apnea) 11/23/2019  . Fatigue 10/23/2019  . Systemic lupus erythematosus ([place]) 10/23/2019  . Rheumatoid arthritis ([place]) 10/23/2019  . Hepatitis B immune 08/01/2019  . IUD (intrauterine device) in place 06/03/2019  . LLQ pain 05/12/2019  . Amenorrhea, secondary 05/12/2019  . Dizziness 11/24/2018  . Atypical chest pain 05/21/2018  . History of iron deficiency anemia 05/21/2018  . Mixed hyperlipidemia 05/21/2018  . Low back pain radiating to right leg 02/17/2018  . Pedal edema 02/06/2018  . History of DVT (deep vein thrombosis) 02/06/2018  . Right wrist injury, subsequent encounter 01/24/2018  . Right knee injury, subsequent encounter 01/24/2018  . Sprain of MCL (medial collateral ligament) of knee 12/12/2016  . Sprain of right ankle 12/12/2016  . Menorrhagia 05/20/2013  . Essential hypertension, benign 03/03/2013  . Morbid obesity ([place]) 03/03/2013  . Obesity 03/03/2013   Past Medical History:  Diagnosis Date  .  Amenorrhea, secondary 05/12/2019   Probable menopause  >>  FSH  42.6  (+ menopause)  . Anemia   . Atypical chest pain 05/21/2018  . Benign tumor of bladder   . Dizziness 11/24/2018  . DVT (deep venous thrombosis) ([place])   . Essential hypertension, benign 03/03/2013  . Fatigue 10/23/2019  . Fibroma of heart   . Hepatitis B immune 08/01/2019  . History of DVT (deep vein thrombosis) 02/06/2018  . History of iron deficiency anemia 05/21/2018  . IUD (intrauterine device) in place 06/03/2019   Located low in uterus 2 small fibroids  Note from 05/20/13: Needs IUD changed in 05/2014. (so was probably placed in 2010)  . LLQ pain 05/12/2019   US Showeed absent right ovary Unable to see Left  ovary Fibroid uterus (2 small ones) with IUD low in Uterus FSH showed pt is in menopause May take IUD out if desires  . Low back pain radiating to right leg 02/17/2018  . Menorrhagia 05/20/2013   Controlled with Mirena.  Probable submucosal fibroid.   . Mixed hyperlipidemia 05/21/2018  . Morbid obesity ([place]) 03/03/2013  . Obesity 03/03/2013  . OSA (obstructive sleep apnea) 11/23/2019  . Pedal edema 02/06/2018  . Rheumatoid arthritis ([place]) 10/23/2019  . Right knee injury, subsequent encounter 01/24/2018  . Right wrist injury, subsequent encounter 01/24/2018  . Sprain of MCL (medial collateral ligament) of knee 12/12/2016  . Sprain of right ankle 12/12/2016  . Systemic lupus erythematosus ([place]) 10/23/2019    Family History  Problem Relation Age of Onset  . Breast cancer Mother   . Hypertension Mother   . Hyperlipidemia Mother   . Colon cancer Father   . Cancer Father   . Thyroid disease Sister   . Cancer Other   . Thyroid disease Sister   . Hypertension Maternal Aunt   . Heart failure Maternal Aunt   . Cancer Maternal Aunt   . Hypertension Maternal Aunt   . Hypertension Maternal Uncle   . Diabetes Maternal Uncle   . Heart failure Maternal Grandmother   . Cancer Maternal Grandfather        prostate    Past Surgical History:  Procedure Laterality Date  . BACK SURGERY    . bladder tack    . DILATION AND CURETTAGE OF UTERUS     after SAB  . HEART TUMOR EXCISION     Social History   Occupational History  . Occupation: Network engineer  Tobacco Use  . Smoking status: Never  . Smokeless tobacco: Never  Vaping Use  . Vaping Use: Never used  Substance and Sexual Activity  . Alcohol use: No  . Drug use: No  . Sexual activity: Yes    Partners: Male    Birth control/protection: Post-menopausal

## 2022-04-06 ENCOUNTER — Ambulatory Visit: Payer: No Typology Code available for payment source | Admitting: Orthopaedic Surgery

## 2022-04-11 ENCOUNTER — Ambulatory Visit: Payer: No Typology Code available for payment source | Admitting: Orthopaedic Surgery

## 2022-04-25 NOTE — Progress Notes (Signed)
GYNECOLOGY OFFICE VISIT NOTE  History:   Monika Caroli YOLANDER GOODIE is a 61 y.o. [phone] here today for ED follow up for vaginal pressure and pain as well as pain with intercourse for about 2 months. She denies bleeding but has noted some dryness externally but also at the same time some discharge with wiping.  She was seen in the ED in [place] so we do not have those records.   She is in process for potentially having gastric bypass. She has chronic constipation and does do some straining for bowel movements and sometimes has a right lower abdominal pain that wraps to her back. She only has the pain during the bowel movement.   She was concerned from the pressure sensation that she might have prolapse because she felt something when she felt inside her vagina.     Past Medical History:  Diagnosis Date   Amenorrhea, secondary 05/12/2019   Probable menopause  >>  FSH  42.6  (+ menopause)   Anemia    Atypical chest pain 05/21/2018   Benign tumor of bladder    Dizziness 11/24/2018   DVT (deep venous thrombosis) ([place])    Essential hypertension, benign 03/03/2013   Fatigue 10/23/2019   Fibroma of heart    Hepatitis B immune 08/01/2019   History of DVT (deep vein thrombosis) 02/06/2018   History of iron deficiency anemia 05/21/2018   IUD (intrauterine device) in place 06/03/2019   Located low in uterus 2 small fibroids  Note from 05/20/13: Needs IUD changed in 05/2014. (so was probably placed in 2010)   LLQ pain 05/12/2019   US Showeed absent right ovary Unable to see Left ovary Fibroid uterus (2 small ones) with IUD low in Uterus FSH showed pt is in menopause May take IUD out if desires   Low back pain radiating to right leg 02/17/2018   Menorrhagia 05/20/2013   Controlled with Mirena.  Probable submucosal fibroid.    Mixed hyperlipidemia 05/21/2018   Morbid obesity ([place]) 03/03/2013   Obesity 03/03/2013   OSA (obstructive sleep apnea) 11/23/2019   Pedal edema 02/06/2018   Rheumatoid arthritis ([place])  10/23/2019   Right knee injury, subsequent encounter 01/24/2018   Right wrist injury, subsequent encounter 01/24/2018   Sprain of MCL (medial collateral ligament) of knee 12/12/2016   Sprain of right ankle 12/12/2016   Systemic lupus erythematosus ([place]) 10/23/2019    Past Surgical History:  Procedure Laterality Date   BACK SURGERY     bladder tack     DILATION AND CURETTAGE OF UTERUS     after SAB   HEART TUMOR EXCISION      The following portions of the patient's history were reviewed and updated as appropriate: allergies, current medications, past family history, past medical history, past social history, past surgical history and problem list.   Health Maintenance:   Diagnosis  Date Value Ref Range Status  04/17/2021 (A)  Final   - Atypical squamous cells, cannot exclude high grade squamous  04/17/2021 intraepithelial lesion (ASC-H) (A)  Final   She had a colposcopy which showed LSIL on bx and ECC>  Review of Systems:  Pertinent items noted in HPI and remainder of comprehensive ROS otherwise negative.  Physical Exam:  BP (!) 161/94   Pulse 69   Ht 5\' 4"  (1.626 m)   Wt 237 lb (107.5 kg)   LMP  (LMP Unknown)   BMI 40.68 kg/m  CONSTITUTIONAL: Well-developed, well-nourished female in no acute distress.  HEENT:  Normocephalic, atraumatic. External right and left ear normal. No scleral icterus.  NECK: Normal range of motion, supple, no masses noted on observation SKIN: No rash noted. Not diaphoretic. No erythema. No pallor. MUSCULOSKELETAL: Normal range of motion. No edema noted. NEUROLOGIC: Alert and oriented to person, place, and time. Normal muscle tone coordination. No cranial nerve deficit noted. PSYCHIATRIC: Normal mood and affect. Normal behavior. Normal judgment and thought content.  CARDIOVASCULAR: Normal heart rate noted RESPIRATORY: Effort and breath sounds normal, no problems with respiration noted ABDOMEN: No masses noted. No other overt distention noted.     PELVIC: Normal appearing external genitalia except some erythema and yellow-Lia discharge at introitus with skin being very tender to qtip test diffusely; normal urethral meatus; normal appearing vaginal mucosa and cervix.  No abnormal discharge noted.  Normal uterine size, no other palpable masses, no uterine or adnexal tenderness. Bladder and lower pelvis muscles tender on exam.  No prolapse on speculum exam of vagina or cervix. Performed in the presence of a chaperone  Labs and Imaging Results for orders placed or performed in visit on 04/26/22 (from the past 168 hour(s))  POCT Urinalysis Dipstick   Collection Time: 04/26/22 11:23 AM  Result Value Ref Range   Color, UA yellow    Clarity, UA cloudy    Glucose, UA Negative Negative   Bilirubin, UA neg    Ketones, UA neg    Spec Grav, UA <=1.005 (A) 1.010 - 1.025   Blood, UA mod    pH, UA 5.5 5.0 - 8.0   Protein, UA Negative Negative   Urobilinogen, UA negative (A) 0.2 or 1.0 E.U./dL   Nitrite, UA neg    Leukocytes, UA Moderate (2+) (A) Negative   Appearance cloudy    Odor malodorous    No results found.  Assessment and Plan:  [place] was seen today for follow-up.  Diagnoses and all orders for this visit:  Abnormal cervical Papanicolaou smear, unspecified abnormal pap finding - She had a pap smear in May 2023 which was normal through [facility].   UTI symptoms -     POCT Urinalysis Dipstick -     Urine Culture -     nitrofurantoin, macrocrystal-monohydrate, (MACROBID) 100 MG capsule; Take 1 capsule (100 mg total) by mouth 2 (two) times daily.  Vaginal irritation -     Cancel: Cervicovaginal ancillary only( [place]) -     Cervicovaginal ancillary only( [place]) -     fluconazole (DIFLUCAN) 150 MG tablet; Take 1 tablet (150 mg total) by mouth every 3 (three) days. For three doses - She preferred empiric therapy while we await culture results  Routine screening for STI (sexually transmitted infection) -      Cervicovaginal ancillary only( [place])  - Per pt request   Routine preventative health maintenance measures emphasized. Please refer to After Visit Summary for other counseling recommendations.   Return if symptoms worsen or fail to improve.  Radene Gunning, MD, [place] for [facility], [place]

## 2022-04-26 ENCOUNTER — Telehealth: Payer: Self-pay | Admitting: *Deleted

## 2022-04-26 ENCOUNTER — Encounter: Payer: Self-pay | Admitting: Obstetrics and Gynecology

## 2022-04-26 ENCOUNTER — Ambulatory Visit (INDEPENDENT_AMBULATORY_CARE_PROVIDER_SITE_OTHER): Payer: No Typology Code available for payment source | Admitting: Obstetrics and Gynecology

## 2022-04-26 ENCOUNTER — Other Ambulatory Visit (HOSPITAL_COMMUNITY)
Admission: RE | Admit: 2022-04-26 | Discharge: 2022-04-26 | Disposition: A | Discharge status: Home / Self Care | Payer: No Typology Code available for payment source | Source: Ambulatory Visit | Attending: Obstetrics and Gynecology | Admitting: Obstetrics and Gynecology

## 2022-04-26 ENCOUNTER — Other Ambulatory Visit (HOSPITAL_BASED_OUTPATIENT_CLINIC_OR_DEPARTMENT_OTHER): Payer: Self-pay

## 2022-04-26 VITALS — BP 161/94 | HR 69 | Ht 64.0 in | Wt 237.0 lb

## 2022-04-26 DIAGNOSIS — N814 Uterovaginal prolapse, unspecified: Secondary | ICD-10-CM

## 2022-04-26 DIAGNOSIS — R87619 Unspecified abnormal cytological findings in specimens from cervix uteri: Secondary | ICD-10-CM | POA: Diagnosis not present

## 2022-04-26 DIAGNOSIS — B9689 Other specified bacterial agents as the cause of diseases classified elsewhere: Secondary | ICD-10-CM

## 2022-04-26 DIAGNOSIS — N898 Other specified noninflammatory disorders of vagina: Secondary | ICD-10-CM | POA: Diagnosis not present

## 2022-04-26 DIAGNOSIS — Z113 Encounter for screening for infections with a predominantly sexual mode of transmission: Secondary | ICD-10-CM | POA: Insufficient documentation

## 2022-04-26 DIAGNOSIS — N760 Acute vaginitis: Secondary | ICD-10-CM

## 2022-04-26 DIAGNOSIS — B3731 Acute candidiasis of vulva and vagina: Secondary | ICD-10-CM

## 2022-04-26 DIAGNOSIS — R399 Unspecified symptoms and signs involving the genitourinary system: Secondary | ICD-10-CM | POA: Diagnosis not present

## 2022-04-26 LAB — POCT URINALYSIS DIPSTICK
Bilirubin, UA: NEGATIVE
Glucose, UA: NEGATIVE
Ketones, UA: NEGATIVE
Nitrite, UA: NEGATIVE
Protein, UA: NEGATIVE
Spec Grav, UA: <=1.005 — AB (ref 1.010–1.025)
Urobilinogen, UA: NEGATIVE E.U./dL — AB
pH, UA: 5.5 (ref 5.0–8.0)

## 2022-04-26 MED ORDER — FLUCONAZOLE 150 MG PO TABS
150.0000 mg | ORAL_TABLET | ORAL | 3 refills | Status: DC
  Filled 2022-04-26: qty 3, 9d supply, fill #0

## 2022-04-26 MED ORDER — NITROFURANTOIN MONOHYD MACRO 100 MG PO CAPS
100.0000 mg | ORAL_CAPSULE | Freq: Two times a day (BID) | ORAL | 0 refills | Status: DC
  Filled 2022-04-26: qty 14, 7d supply, fill #0

## 2022-04-26 NOTE — Telephone Encounter (Signed)
Spoke with Mellody Dance about patient's appointment. Patient was given contact information for Mellody Dance to discuss issues that the patient had with her appointment on 04/26/2022 at 11:10 AM.

## 2022-04-27 LAB — URINE CULTURE
MICRO NUMBER:: 13673015
Result:: NO GROWTH
SPECIMEN QUALITY:: ADEQUATE

## 2022-04-27 LAB — CERVICOVAGINAL ANCILLARY ONLY
Bacterial Vaginitis (gardnerella): POSITIVE — AB
Candida Glabrata: NEGATIVE
Candida Vaginitis: POSITIVE — AB
Chlamydia: NEGATIVE
Comment: NEGATIVE
Comment: NEGATIVE
Comment: NEGATIVE
Comment: NEGATIVE
Comment: NEGATIVE
Comment: NORMAL
Neisseria Gonorrhea: NEGATIVE
Trichomonas: NEGATIVE

## 2022-04-30 ENCOUNTER — Telehealth: Payer: Self-pay | Admitting: *Deleted

## 2022-04-30 ENCOUNTER — Other Ambulatory Visit (HOSPITAL_BASED_OUTPATIENT_CLINIC_OR_DEPARTMENT_OTHER): Payer: Self-pay

## 2022-04-30 ENCOUNTER — Other Ambulatory Visit: Payer: Self-pay | Admitting: *Deleted

## 2022-04-30 DIAGNOSIS — B9689 Other specified bacterial agents as the cause of diseases classified elsewhere: Secondary | ICD-10-CM

## 2022-04-30 DIAGNOSIS — N898 Other specified noninflammatory disorders of vagina: Secondary | ICD-10-CM

## 2022-04-30 MED ORDER — METRONIDAZOLE 500 MG PO TABS
500.0000 mg | ORAL_TABLET | Freq: Two times a day (BID) | ORAL | 0 refills | Status: DC
  Filled 2022-04-30: qty 14, 7d supply, fill #0

## 2022-04-30 MED ORDER — FLUCONAZOLE 150 MG PO TABS: 150.0000 mg | ORAL_TABLET | ORAL | 3 refills | Status: DC

## 2022-04-30 MED ORDER — METRONIDAZOLE 500 MG PO TABS: 500.0000 mg | ORAL_TABLET | Freq: Two times a day (BID) | ORAL | 0 refills | Status: DC

## 2022-04-30 NOTE — Addendum Note (Signed)
Addended by: Radene Gunning A on: 04/30/2022 08:19 AM   Modules accepted: Orders

## 2022-04-30 NOTE — Telephone Encounter (Cosign Needed)
Pt called stating that she wants her RX for Flagyl and Diflucan be sent to a pharmacy in [facility] instead of [place].  Pt [facility] pharmacy as [facility] on Thursday when she was here but changed her mind.  RX fixed and sent to [facility].

## 2022-05-16 ENCOUNTER — Encounter (INDEPENDENT_AMBULATORY_CARE_PROVIDER_SITE_OTHER): Payer: Self-pay

## 2022-05-24 ENCOUNTER — Other Ambulatory Visit (HOSPITAL_BASED_OUTPATIENT_CLINIC_OR_DEPARTMENT_OTHER): Payer: Self-pay

## 2022-05-29 ENCOUNTER — Encounter (HOSPITAL_BASED_OUTPATIENT_CLINIC_OR_DEPARTMENT_OTHER): Payer: Self-pay

## 2022-05-29 ENCOUNTER — Other Ambulatory Visit (HOSPITAL_BASED_OUTPATIENT_CLINIC_OR_DEPARTMENT_OTHER): Payer: Self-pay

## 2022-05-29 MED ORDER — WEGOVY 0.5 MG/0.5ML ~~LOC~~ SOAJ: SUBCUTANEOUS | 0 refills | Status: DC

## 2022-05-29 MED ORDER — WEGOVY 0.25 MG/0.5ML ~~LOC~~ SOAJ
SUBCUTANEOUS | 0 refills | Status: DC
  Filled 2022-05-29: qty 2, 28d supply, fill #0

## 2022-05-29 MED ORDER — WEGOVY 1 MG/0.5ML ~~LOC~~ SOAJ: SUBCUTANEOUS | 0 refills | Status: DC

## 2022-06-01 ENCOUNTER — Other Ambulatory Visit (HOSPITAL_BASED_OUTPATIENT_CLINIC_OR_DEPARTMENT_OTHER): Payer: Self-pay

## 2022-06-14 ENCOUNTER — Ambulatory Visit (INDEPENDENT_AMBULATORY_CARE_PROVIDER_SITE_OTHER): Payer: No Typology Code available for payment source

## 2022-06-14 ENCOUNTER — Ambulatory Visit: Payer: No Typology Code available for payment source | Admitting: Orthopaedic Surgery

## 2022-06-14 ENCOUNTER — Other Ambulatory Visit (HOSPITAL_BASED_OUTPATIENT_CLINIC_OR_DEPARTMENT_OTHER): Payer: Self-pay

## 2022-06-14 ENCOUNTER — Encounter: Payer: Self-pay | Admitting: Orthopaedic Surgery

## 2022-06-14 DIAGNOSIS — M545 Low back pain, unspecified: Secondary | ICD-10-CM | POA: Diagnosis not present

## 2022-06-14 DIAGNOSIS — M25562 Pain in left knee: Secondary | ICD-10-CM

## 2022-06-14 MED ORDER — IBUPROFEN 800 MG PO TABS
800.0000 mg | ORAL_TABLET | Freq: Three times a day (TID) | ORAL | 2 refills | Status: DC | PRN
  Filled 2022-06-14: qty 30, 10d supply, fill #0

## 2022-06-14 MED ORDER — CYCLOBENZAPRINE HCL 5 MG PO TABS
5.0000 mg | ORAL_TABLET | Freq: Three times a day (TID) | ORAL | 3 refills | Status: DC | PRN
  Filled 2022-06-14: qty 30, 5d supply, fill #0

## 2022-06-14 NOTE — Progress Notes (Signed)
Office Visit Note   Patient: Destiny Henderson           Date of Birth: 1961-04-30           MRN: [id number] Visit Date: 06/14/2022              Requested by: Judson Roch, [address] [place],  [place] [address] PCP: Judson Roch, FNP   Assessment & Plan: Visit Diagnoses:  1. Acute pain of left knee   2. Acute midline low back pain, unspecified whether sciatica present     Plan: Impression is back and knee contusions from mechanical fall.  Will treat symptomatically with rest, ice, Advil, cyclobenzaprine.  Follow-up as needed.  Follow-Up Instructions: No follow-ups on file.   Orders:  Orders Placed This Encounter  Procedures   XR KNEE 3 VIEW LEFT   XR Lumbar Spine 2-3 Views   Meds ordered this encounter  Medications   ibuprofen (ADVIL) 800 MG tablet    Sig: Take 1 tablet (800 mg total) by mouth every 8 (eight) hours as needed.    Dispense:  30 tablet    Refill:  2   cyclobenzaprine (FLEXERIL) 5 MG tablet    Sig: Take 1-2 tablets (5-10 mg total) by mouth 3 (three) times daily as needed for muscle spasms.    Dispense:  30 tablet    Refill:  3      Procedures: No procedures performed   Clinical Data: No additional findings.   Subjective: Chief Complaint  Patient presents with   Left Knee - Pain    DOI 06/13/2022   Lower Back - Pain    DOI 06/13/2022    HPI Destiny Henderson is a 61 year old female who comes in for evaluation painful knees left ankle and lower back.  Missed a step and fell.  Has been using a heating pad and ibuprofen.  Has diffuse left knee pain.  Has low back pain without radicular symptoms.  Denies any red flag symptoms. Review of Systems  Constitutional: Negative.   HENT: Negative.    Eyes: Negative.   Respiratory: Negative.    Cardiovascular: Negative.   Endocrine: Negative.   Musculoskeletal: Negative.   Neurological: Negative.   Hematological: Negative.   Psychiatric/Behavioral: Negative.    All other systems  reviewed and are negative.    Objective: Vital Signs: LMP  (LMP Unknown)   Physical Exam Vitals and nursing note reviewed.  Constitutional:      Appearance: She is well-developed.  HENT:     Head: Atraumatic.     Nose: Nose normal.  Eyes:     Extraocular Movements: Extraocular movements intact.  Cardiovascular:     Pulses: Normal pulses.  Pulmonary:     Effort: Pulmonary effort is normal.  Abdominal:     Palpations: Abdomen is soft.  Musculoskeletal:     Cervical back: Neck supple.  Skin:    General: Skin is warm.     Capillary Refill: Capillary refill takes less than 2 seconds.  Neurological:     Mental Status: She is alert. Mental status is at baseline.  Psychiatric:        Behavior: Behavior normal.        Thought Content: Thought content normal.        Judgment: Judgment normal.    Ortho Exam Examination of bilateral knees is nonfocal.  No joint effusions.  No joint line tenderness.  Good range of motion.  Collaterals and cruciates are stable.  Examination lumbar spine shows pain with lateral bending and forward bending.  No sciatic tension signs. Specialty Comments:  No specialty comments available.  Imaging: No results found.   PMFS History: Patient Active Problem List   Diagnosis Date Noted   Pap smear of cervix with ASCUS, cannot exclude HGSIL on 04/17/2021 04/17/2021   Fibroma of heart    DVT (deep venous thrombosis) ([facility])    Benign tumor of bladder    Anemia    Syncope and collapse 12/31/2019   OSA (obstructive sleep apnea) 11/23/2019   Fatigue 10/23/2019   Systemic lupus erythematosus ([place]) 10/23/2019   Rheumatoid arthritis ([place]) 10/23/2019   Hepatitis B immune 08/01/2019   IUD (intrauterine device) in place 06/03/2019   LLQ pain 05/12/2019   Amenorrhea, secondary 05/12/2019   Dizziness 11/24/2018   Atypical chest pain 05/21/2018   History of iron deficiency anemia 05/21/2018   Mixed hyperlipidemia 05/21/2018   Low back pain radiating to  right leg 02/17/2018   Pedal edema 02/06/2018   History of DVT (deep vein thrombosis) 02/06/2018   Right wrist injury, subsequent encounter 01/24/2018   Right knee injury, subsequent encounter 01/24/2018   Sprain of MCL (medial collateral ligament) of knee 12/12/2016   Sprain of right ankle 12/12/2016   Menorrhagia 05/20/2013   Essential hypertension, benign 03/03/2013   Morbid obesity ([place]) 03/03/2013   Obesity 03/03/2013   Past Medical History:  Diagnosis Date   Amenorrhea, secondary 05/12/2019   Probable menopause  >>  FSH  42.6  (+ menopause)   Anemia    Atypical chest pain 05/21/2018   Benign tumor of bladder    Dizziness 11/24/2018   DVT (deep venous thrombosis) ([place])    Essential hypertension, benign 03/03/2013   Fatigue 10/23/2019   Fibroma of heart    Hepatitis B immune 08/01/2019   History of DVT (deep vein thrombosis) 02/06/2018   History of iron deficiency anemia 05/21/2018   IUD (intrauterine device) in place 06/03/2019   Located low in uterus 2 small fibroids  Note from 05/20/13: Needs IUD changed in 05/2014. (so was probably placed in 2010)   LLQ pain 05/12/2019   US Showeed absent right ovary Unable to see Left ovary Fibroid uterus (2 small ones) with IUD low in Uterus FSH showed pt is in menopause May take IUD out if desires   Low back pain radiating to right leg 02/17/2018   Menorrhagia 05/20/2013   Controlled with Mirena.  Probable submucosal fibroid.    Mixed hyperlipidemia 05/21/2018   Morbid obesity ([place]) 03/03/2013   Obesity 03/03/2013   OSA (obstructive sleep apnea) 11/23/2019   Pedal edema 02/06/2018   Rheumatoid arthritis ([place]) 10/23/2019   Right knee injury, subsequent encounter 01/24/2018   Right wrist injury, subsequent encounter 01/24/2018   Sprain of MCL (medial collateral ligament) of knee 12/12/2016   Sprain of right ankle 12/12/2016   Systemic lupus erythematosus ([place]) 10/23/2019    Family History  Problem Relation Age of Onset   Breast cancer Mother     Hypertension Mother    Hyperlipidemia Mother    Colon cancer Father    Cancer Father    Thyroid disease Sister    Cancer Other    Thyroid disease Sister    Hypertension Maternal Aunt    Heart failure Maternal Aunt    Cancer Maternal Aunt    Hypertension Maternal Aunt    Hypertension Maternal Uncle    Diabetes Maternal Uncle    Heart failure Maternal Grandmother  Cancer Maternal Grandfather        prostate    Past Surgical History:  Procedure Laterality Date   BACK SURGERY     bladder tack     DILATION AND CURETTAGE OF UTERUS     after SAB   HEART TUMOR EXCISION     Social History   Occupational History   Occupation: Network engineer  Tobacco Use   Smoking status: Never   Smokeless tobacco: Never  Vaping Use   Vaping Use: Never used  Substance and Sexual Activity   Alcohol use: No   Drug use: No   Sexual activity: Yes    Partners: Male    Birth control/protection: Post-menopausal

## 2022-07-03 ENCOUNTER — Telehealth: Payer: Self-pay | Admitting: Orthopaedic Surgery

## 2022-07-03 NOTE — Telephone Encounter (Signed)
yes

## 2022-07-03 NOTE — Telephone Encounter (Signed)
Pt called stating that Dr Erlinda Hong referred her to physical therapy and pt told her to ask for a lumber seat for when she is at work.. Please call pt about this matter at [phone].

## 2022-07-03 NOTE — Telephone Encounter (Signed)
Called patient. LMOM that script is up front for pick up.

## 2022-07-03 NOTE — Telephone Encounter (Signed)
IC pt ,lmvm advised to [facility] to regarding her request for a copy of her medical records.

## 2022-07-04 ENCOUNTER — Other Ambulatory Visit (HOSPITAL_BASED_OUTPATIENT_CLINIC_OR_DEPARTMENT_OTHER): Payer: Self-pay

## 2022-07-04 MED ORDER — OZEMPIC (1 MG/DOSE) 4 MG/3ML ~~LOC~~ SOPN
1.0000 mg | PEN_INJECTOR | SUBCUTANEOUS | 2 refills | Status: DC
  Filled 2022-07-04: qty 3, 28d supply, fill #0

## 2022-07-10 ENCOUNTER — Other Ambulatory Visit (HOSPITAL_BASED_OUTPATIENT_CLINIC_OR_DEPARTMENT_OTHER): Payer: Self-pay

## 2022-07-16 ENCOUNTER — Other Ambulatory Visit (HOSPITAL_BASED_OUTPATIENT_CLINIC_OR_DEPARTMENT_OTHER): Payer: Self-pay

## 2022-07-27 ENCOUNTER — Other Ambulatory Visit (HOSPITAL_BASED_OUTPATIENT_CLINIC_OR_DEPARTMENT_OTHER): Payer: Self-pay

## 2022-07-27 MED ORDER — OZEMPIC (2 MG/DOSE) 8 MG/3ML ~~LOC~~ SOPN
2.0000 mg | PEN_INJECTOR | SUBCUTANEOUS | 1 refills | Status: DC
  Filled 2022-07-27: qty 3, 28d supply, fill #0
  Filled 2022-08-31: qty 3, 28d supply, fill #1

## 2022-07-30 ENCOUNTER — Other Ambulatory Visit (HOSPITAL_BASED_OUTPATIENT_CLINIC_OR_DEPARTMENT_OTHER): Payer: Self-pay

## 2022-07-30 ENCOUNTER — Other Ambulatory Visit (HOSPITAL_COMMUNITY): Payer: Self-pay

## 2022-07-31 ENCOUNTER — Other Ambulatory Visit (HOSPITAL_BASED_OUTPATIENT_CLINIC_OR_DEPARTMENT_OTHER): Payer: Self-pay

## 2022-07-31 ENCOUNTER — Other Ambulatory Visit (HOSPITAL_COMMUNITY): Payer: Self-pay

## 2022-08-02 ENCOUNTER — Other Ambulatory Visit (HOSPITAL_COMMUNITY): Payer: Self-pay

## 2022-08-02 ENCOUNTER — Other Ambulatory Visit (HOSPITAL_BASED_OUTPATIENT_CLINIC_OR_DEPARTMENT_OTHER): Payer: Self-pay

## 2022-08-03 ENCOUNTER — Other Ambulatory Visit (HOSPITAL_BASED_OUTPATIENT_CLINIC_OR_DEPARTMENT_OTHER): Payer: Self-pay

## 2022-08-07 ENCOUNTER — Other Ambulatory Visit (HOSPITAL_BASED_OUTPATIENT_CLINIC_OR_DEPARTMENT_OTHER): Payer: Self-pay

## 2022-08-15 ENCOUNTER — Telehealth: Payer: Self-pay

## 2022-08-15 ENCOUNTER — Other Ambulatory Visit (HOSPITAL_BASED_OUTPATIENT_CLINIC_OR_DEPARTMENT_OTHER): Payer: Self-pay

## 2022-08-15 NOTE — Telephone Encounter (Signed)
Pt called requesting appointment for this Friday for ER f/u for abdominal pain, lower back pain and UTI symptoms. Pt was told there was no available Friday appointments as of this time. Pt requested I send a message to the nurse and states "you are front desk you are not qualified for this". I ensured pt I was a part of the clinical staff and could assist her. I let pt know she would need an appointment to evaluate symptoms. I also recommended pt to go to PCP for sooner available appointment. Pt does not want to go to PCP. I put pt on hold to find available appointments and offered for her to speak to the RN while I looked for appointments. Call must've disconnected or pt might've hung up. Attempted to call pt back and there was no answer. Pt called back stating she was on phone with PCP and they will see her tomorrow to evaluate UTI symptoms. I offered pt to go ahead and schedule f/u appt with [name] in case she needs it. Due to pt's schedule availability pt was scheduled for 09/17/22 with Dr.Duncan.

## 2022-08-22 ENCOUNTER — Other Ambulatory Visit (HOSPITAL_BASED_OUTPATIENT_CLINIC_OR_DEPARTMENT_OTHER): Payer: Self-pay

## 2022-08-22 MED ORDER — METOCLOPRAMIDE HCL 5 MG PO TABS
5.0000 mg | ORAL_TABLET | Freq: Four times a day (QID) | ORAL | 0 refills | Status: DC
  Filled 2022-08-22: qty 15, 4d supply, fill #0

## 2022-08-22 MED ORDER — METHOCARBAMOL 500 MG PO TABS
500.0000 mg | ORAL_TABLET | Freq: Three times a day (TID) | ORAL | 0 refills | Status: DC | PRN
  Filled 2022-08-22: qty 21, 7d supply, fill #0

## 2022-08-31 ENCOUNTER — Other Ambulatory Visit (HOSPITAL_BASED_OUTPATIENT_CLINIC_OR_DEPARTMENT_OTHER): Payer: Self-pay

## 2022-09-17 ENCOUNTER — Encounter: Payer: Self-pay | Admitting: Obstetrics and Gynecology

## 2022-09-17 ENCOUNTER — Other Ambulatory Visit (HOSPITAL_COMMUNITY)
Admission: RE | Admit: 2022-09-17 | Discharge: 2022-09-17 | Disposition: A | Discharge status: Home / Self Care | Payer: No Typology Code available for payment source | Source: Ambulatory Visit | Attending: Obstetrics and Gynecology | Admitting: Obstetrics and Gynecology

## 2022-09-17 ENCOUNTER — Ambulatory Visit (INDEPENDENT_AMBULATORY_CARE_PROVIDER_SITE_OTHER): Payer: No Typology Code available for payment source | Admitting: Obstetrics and Gynecology

## 2022-09-17 ENCOUNTER — Other Ambulatory Visit (HOSPITAL_BASED_OUTPATIENT_CLINIC_OR_DEPARTMENT_OTHER): Payer: Self-pay

## 2022-09-17 VITALS — BP 176/82 | HR 67 | Resp 16 | Ht 64.0 in | Wt 220.0 lb

## 2022-09-17 DIAGNOSIS — D219 Benign neoplasm of connective and other soft tissue, unspecified: Secondary | ICD-10-CM

## 2022-09-17 DIAGNOSIS — N898 Other specified noninflammatory disorders of vagina: Secondary | ICD-10-CM | POA: Diagnosis not present

## 2022-09-17 DIAGNOSIS — Z113 Encounter for screening for infections with a predominantly sexual mode of transmission: Secondary | ICD-10-CM

## 2022-09-17 DIAGNOSIS — N951 Menopausal and female climacteric states: Secondary | ICD-10-CM

## 2022-09-17 DIAGNOSIS — M549 Dorsalgia, unspecified: Secondary | ICD-10-CM | POA: Diagnosis not present

## 2022-09-17 DIAGNOSIS — N719 Inflammatory disease of uterus, unspecified: Secondary | ICD-10-CM

## 2022-09-17 LAB — POCT URINALYSIS DIPSTICK
Bilirubin, UA: NEGATIVE
Blood, UA: NEGATIVE
Glucose, UA: NEGATIVE
Ketones, UA: NEGATIVE
Leukocytes, UA: NEGATIVE
Nitrite, UA: NEGATIVE
Protein, UA: NEGATIVE
Spec Grav, UA: 1.01 (ref 1.010–1.025)
Urobilinogen, UA: NEGATIVE E.U./dL — AB
pH, UA: 7.5 (ref 5.0–8.0)

## 2022-09-17 MED ORDER — DOXYCYCLINE HYCLATE 100 MG PO CAPS
100.0000 mg | ORAL_CAPSULE | Freq: Two times a day (BID) | ORAL | 0 refills | Status: DC
  Filled 2022-09-17: qty 20, 10d supply, fill #0

## 2022-09-17 NOTE — Progress Notes (Signed)
GYNECOLOGY OFFICE NOTE  History:  61 y.o. 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  Right sided stabbing pain for about 2 months, which has improved since her laparoscopic appendectomy. Also feels sharp pain in lower abdomen for about two months, different from right sided pain. Painful when she has bowel movements. The sharp pain in lower abdomen is around once per day and makes her "jump" or catch her breath." Some urinary urgency, no dysuria. Feels stream is weaker and she is not emptying well. Has appt with Urology, also with some urgency.   S/p laparoscopic appendectomy on 08/10/22 @ [facility].    Past Medical History:  Diagnosis Date   Amenorrhea, secondary 05/12/2019   Probable menopause  >>  FSH  42.6  (+ menopause)   Anemia    Atypical chest pain 05/21/2018   Benign tumor of bladder    Dizziness 11/24/2018   DVT (deep venous thrombosis) ([place])    Essential hypertension, benign 03/03/2013   Fatigue 10/23/2019   Fibroma of heart    Hepatitis B immune 08/01/2019   History of DVT (deep vein thrombosis) 02/06/2018   History of iron deficiency anemia 05/21/2018   IUD (intrauterine device) in place 06/03/2019   Located low in uterus 2 small fibroids  Note from 05/20/13: Needs IUD changed in 05/2014. (so was probably placed in 2010)   LLQ pain 05/12/2019   US Showeed absent right ovary Unable to see Left ovary Fibroid uterus (2 small ones) with IUD low in Uterus FSH showed pt is in menopause May take IUD out if desires   Low back pain radiating to right leg 02/17/2018   Menorrhagia 05/20/2013   Controlled with Mirena.  Probable submucosal fibroid.    Mixed hyperlipidemia 05/21/2018   Morbid obesity ([place]) 03/03/2013   Obesity 03/03/2013   OSA (obstructive sleep apnea) 11/23/2019   Pedal edema 02/06/2018   Rheumatoid arthritis ([place]) 10/23/2019   Right knee injury, subsequent encounter 01/24/2018   Right wrist injury, subsequent encounter 01/24/2018   Sprain of MCL (medial collateral  ligament) of knee 12/12/2016   Sprain of right ankle 12/12/2016   Systemic lupus erythematosus ([place]) 10/23/2019    Past Surgical History:  Procedure Laterality Date   APPENDECTOMY     BACK SURGERY     bladder tack     DILATION AND CURETTAGE OF UTERUS     after SAB   HEART TUMOR EXCISION       Current Outpatient Medications:    b complex vitamins capsule, Take 1 capsule by mouth daily., Disp: , Rfl:    CARTIA XT 300 MG 24 hr capsule, TAKE 1 CAPSULE (300 MG TOTAL) BY MOUTH DAILY., Disp: 90 capsule, Rfl: 2   Coenzyme Q10 (CO Q 10 PO), Take by mouth., Disp: , Rfl:    cyclobenzaprine (FLEXERIL) 5 MG tablet, Take 1-2 tablets (5-10 mg total) by mouth 3 (three) times daily as needed for muscle spasms., Disp: 30 tablet, Rfl: 3   doxycycline (VIBRAMYCIN) 100 MG capsule, Take 1 capsule (100 mg total) by mouth 2 (two) times daily., Disp: 20 capsule, Rfl: 0   Echinacea 500 MG CAPS, Take by mouth., Disp: , Rfl:    fish oil-omega-3 fatty acids 1000 MG capsule, Take 2 g by mouth daily., Disp: , Rfl:    Flaxseed, Linseed, 1000 MG CAPS, Take by mouth., Disp: , Rfl:    ibuprofen (ADVIL) 800 MG tablet, Take 1 tablet (800 mg total) by mouth every 8 (eight)  hours as needed (pain)., Disp: 30 tablet, Rfl: 0   ibuprofen (ADVIL) 800 MG tablet, Take 1 tablet (800 mg total) by mouth every 8 (eight) hours as needed., Disp: 30 tablet, Rfl: 2   Melatonin 5 MG CAPS, Take by mouth as needed. , Disp: , Rfl:    methocarbamol (ROBAXIN) 500 MG tablet, Take 1 tablet (500 mg total) by mouth 3 (three) times daily as needed., Disp: 21 tablet, Rfl: 0   metoCLOPramide (REGLAN) 5 MG tablet, Take 1 tablet (5 mg total) by mouth 4 (four) times daily., Disp: 15 tablet, Rfl: 0   Multiple Vitamins-Minerals (MEGA MULTIVITAMIN FOR WOMEN) TABS, Take by mouth., Disp: , Rfl:    naproxen (NAPROSYN) 500 MG tablet, Take 1 tablet (500 mg total) by mouth 2 (two) times daily as needed., Disp: 20 tablet, Rfl: 3   OZEMPIC, 1 MG/DOSE, 4 MG/3ML SOPN,  Inject 1 mg as directed every 7 (seven) days., Disp: , Rfl:    potassium chloride (K-DUR) 10 MEQ tablet, Take 1 tablet (10 mEq total) by mouth daily., Disp: 90 tablet, Rfl: 1   Pyridoxine HCl (VITAMIN B-6) 500 MG tablet, Take 500 mg by mouth daily., Disp: , Rfl:    Semaglutide, 1 MG/DOSE, (OZEMPIC, 1 MG/DOSE,) 4 MG/3ML SOPN, Inject 1 mg into the skin once a week., Disp: 3 mL, Rfl: 2   Semaglutide, 2 MG/DOSE, (OZEMPIC, 2 MG/DOSE,) 8 MG/3ML SOPN, Inject 2 mg into the skin once a week., Disp: 9 mL, Rfl: 1   telmisartan-hydrochlorothiazide (MICARDIS HCT) 80-25 MG tablet, Take 1 tablet by mouth daily., Disp: 90 tablet, Rfl: 1   VITAMIN A OP, Apply to eye., Disp: , Rfl:    vitamin B-12 (CYANOCOBALAMIN) 1000 MCG tablet, Take 1,000 mcg by mouth daily., Disp: , Rfl:    vitamin C (ASCORBIC ACID) 500 MG tablet, Take 500 mg by mouth daily., Disp: , Rfl:    VITAMIN D, CHOLECALCIFEROL, PO, Take by mouth., Disp: , Rfl:    furosemide (LASIX) 40 MG tablet, Take 1 tablet (40 mg total) by mouth daily., Disp: 90 tablet, Rfl: 1   hydrALAZINE (APRESOLINE) 50 MG tablet, Take 1 tablet (50 mg total) by mouth in the morning and at bedtime., Disp: 180 tablet, Rfl: 3   spironolactone (ALDACTONE) 25 MG tablet, Take 0.5 tablets (12.5 mg total) by mouth daily., Disp: 30 tablet, Rfl: 1  The following portions of the patient's history were reviewed and updated as appropriate: allergies, current medications, past family history, past medical history, past social history, past surgical history and problem list.   Review of Systems:  Pertinent items noted in HPI and remainder of comprehensive ROS otherwise negative.   Objective:  Physical Exam BP (!) 176/82   Pulse 67   Resp 16   Ht 5\' 4"  (1.626 m)   Wt 220 lb (99.8 kg)   LMP  (LMP Unknown)   BMI 37.76 kg/m  CONSTITUTIONAL: Well-developed, well-nourished female in no acute distress.  HENT:  Normocephalic, atraumatic. External right and left ear normal. Oropharynx is clear  and moist EYES: Conjunctivae and EOM are normal. Pupils are equal, round, and reactive to light. No scleral icterus.  NECK: Normal range of motion, supple, no masses SKIN: Skin is warm and dry. No rash noted. Not diaphoretic. No erythema. No pallor. NEUROLOGIC: Alert and oriented to person, place, and time. Normal reflexes, muscle tone coordination. No cranial nerve deficit noted. PSYCHIATRIC: Normal mood and affect. Normal behavior. Normal judgment and thought content. CARDIOVASCULAR: Normal heart rate noted  RESPIRATORY: Effort normal, no problems with respiration noted ABDOMEN: Soft, no distention noted.   PELVIC: Normal appearing external genitalia; normal appearing vaginal mucosa and cervix.  [place] discharge noted. pelvic cultures obtained. Normal uterine size, no other palpable masses, significant cervical and uterine tenderness, mild adnexal tenderness bilaterally, no adnexal masses palpable. MUSCULOSKELETAL: Normal range of motion. No edema noted.  Exam done with chaperone present.  Labs and Imaging No results found.  Assessment & Plan:   1. Back pain, unspecified back location, unspecified back pain laterality, unspecified chronicity - POCT Urinalysis Dipstick - Urine Culture  2. Vaginal discharge - Cervicovaginal ancillary only( [place])  3. Routine screening for STI (sexually transmitted infection) - Cervicovaginal ancillary only( [place])  4. Fibroid Not palpable on exam Reassured that generally fibroids do not require followup/management - US PELVIC COMPLETE WITH TRANSVAGINAL; Future  5. Endometritis - Given acute onset of pain and how tender she is on exam, possible endometritis vs referred inflammation from appendicitis - will trial course doxy for uterine tenderness, concern for endometritis picture  Routine preventative health maintenance measures emphasized. Please refer to After Visit Summary for other counseling recommendations.   Return in  about 4 weeks (around 10/15/2022) for after [name].   Feliz Beam, MD, [place] for [employer] [facility])

## 2022-09-18 LAB — CERVICOVAGINAL ANCILLARY ONLY
Bacterial Vaginitis (gardnerella): POSITIVE — AB
Candida Glabrata: NEGATIVE
Candida Vaginitis: NEGATIVE
Chlamydia: NEGATIVE
Comment: NEGATIVE
Comment: NEGATIVE
Comment: NEGATIVE
Comment: NEGATIVE
Comment: NEGATIVE
Comment: NORMAL
Neisseria Gonorrhea: NEGATIVE
Trichomonas: NEGATIVE

## 2022-09-19 ENCOUNTER — Other Ambulatory Visit (HOSPITAL_BASED_OUTPATIENT_CLINIC_OR_DEPARTMENT_OTHER): Payer: Self-pay

## 2022-09-19 ENCOUNTER — Other Ambulatory Visit: Payer: Self-pay | Admitting: Family Medicine

## 2022-09-19 LAB — URINE CULTURE
MICRO NUMBER:: 14301478
SPECIMEN QUALITY:: ADEQUATE

## 2022-09-19 MED ORDER — METRONIDAZOLE 500 MG PO TABS
500.0000 mg | ORAL_TABLET | Freq: Two times a day (BID) | ORAL | 0 refills | Status: AC
  Filled 2022-09-19: qty 14, 7d supply, fill #0

## 2022-09-21 ENCOUNTER — Telehealth (HOSPITAL_BASED_OUTPATIENT_CLINIC_OR_DEPARTMENT_OTHER): Payer: Self-pay

## 2022-09-24 ENCOUNTER — Ambulatory Visit (INDEPENDENT_AMBULATORY_CARE_PROVIDER_SITE_OTHER): Payer: No Typology Code available for payment source

## 2022-09-24 DIAGNOSIS — R1030 Lower abdominal pain, unspecified: Secondary | ICD-10-CM | POA: Diagnosis not present

## 2022-09-24 DIAGNOSIS — Z9889 Other specified postprocedural states: Secondary | ICD-10-CM | POA: Diagnosis not present

## 2022-09-24 DIAGNOSIS — Z90721 Acquired absence of ovaries, unilateral: Secondary | ICD-10-CM | POA: Diagnosis not present

## 2022-09-24 DIAGNOSIS — D219 Benign neoplasm of connective and other soft tissue, unspecified: Secondary | ICD-10-CM | POA: Diagnosis not present

## 2022-09-25 ENCOUNTER — Other Ambulatory Visit (HOSPITAL_BASED_OUTPATIENT_CLINIC_OR_DEPARTMENT_OTHER): Payer: Self-pay

## 2022-09-25 ENCOUNTER — Telehealth: Payer: Self-pay

## 2022-09-25 DIAGNOSIS — N951 Menopausal and female climacteric states: Secondary | ICD-10-CM

## 2022-09-25 DIAGNOSIS — Z113 Encounter for screening for infections with a predominantly sexual mode of transmission: Secondary | ICD-10-CM

## 2022-09-25 NOTE — Telephone Encounter (Signed)
Patient is a patient of J[facility] Ser and is upset because she drove from [place] to have labs drawn and the [facility] is "never open" in [place].  Switching orders to [place] so patient can have labs drawn. Kathrene Alu RN

## 2022-09-26 LAB — HIV ANTIBODY (ROUTINE TESTING W REFLEX): HIV Screen 4th Generation wRfx: NONREACTIVE

## 2022-09-26 LAB — RPR: RPR Ser Ql: NONREACTIVE

## 2022-09-26 LAB — HEPATITIS C ANTIBODY: Hep C Virus Ab: NONREACTIVE

## 2022-09-26 LAB — FOLLICLE STIMULATING HORMONE: FSH: 47.9 m[IU]/mL (ref 25.8–134.8)

## 2022-09-26 LAB — HEPATITIS B SURFACE ANTIGEN: Hepatitis B Surface Ag: NEGATIVE

## 2022-10-03 ENCOUNTER — Other Ambulatory Visit: Payer: No Typology Code available for payment source

## 2022-10-08 HISTORY — PX: APPENDECTOMY: SHX54

## 2022-10-15 ENCOUNTER — Ambulatory Visit: Payer: No Typology Code available for payment source | Admitting: Obstetrics & Gynecology

## 2022-10-18 ENCOUNTER — Ambulatory Visit: Payer: No Typology Code available for payment source | Admitting: Obstetrics and Gynecology

## 2022-10-22 ENCOUNTER — Encounter: Payer: Self-pay | Admitting: Obstetrics and Gynecology

## 2022-10-25 ENCOUNTER — Encounter: Payer: Self-pay | Admitting: Obstetrics and Gynecology

## 2022-10-25 ENCOUNTER — Other Ambulatory Visit (HOSPITAL_BASED_OUTPATIENT_CLINIC_OR_DEPARTMENT_OTHER): Payer: Self-pay

## 2022-10-25 ENCOUNTER — Ambulatory Visit: Payer: No Typology Code available for payment source | Admitting: Obstetrics and Gynecology

## 2022-10-25 VITALS — BP 159/97 | HR 65 | Ht 64.0 in | Wt 216.0 lb

## 2022-10-25 DIAGNOSIS — D219 Benign neoplasm of connective and other soft tissue, unspecified: Secondary | ICD-10-CM | POA: Diagnosis not present

## 2022-10-25 DIAGNOSIS — R399 Unspecified symptoms and signs involving the genitourinary system: Secondary | ICD-10-CM

## 2022-10-25 DIAGNOSIS — R102 Pelvic and perineal pain: Secondary | ICD-10-CM | POA: Diagnosis not present

## 2022-10-25 MED ORDER — IBUPROFEN 800 MG PO TABS
800.0000 mg | ORAL_TABLET | Freq: Three times a day (TID) | ORAL | 0 refills | Status: AC | PRN
  Filled 2022-10-25: qty 30, 10d supply, fill #0

## 2022-10-25 NOTE — Progress Notes (Addendum)
RETURN GYNECOLOGY VISIT  Subjective:  Destiny Henderson is a 62 y.o. [id number] postmenopausal women with fibroids & hx VTE, HTN, BMI 37, prediabetes presenting for follow up of pelvic pain.  Reports shooting lower abdominal/pelvic pain that has been present for approx 3-4 months. Was seen by Dr. Earlene Plater on 09/17/22 where exam was notable for uterine, adnexal, and cervical tenderness. She was ~4 weeks s/p lsc appe at that time. She was treated with doxycycline for possible endometritis. STI screening negative for GC/CT/trich. Pelvic US w/ small <2cm fibroids, overall uterine volume decreased from [name] on 05/2019 and 05/2013.Marland Kitchen  Since that appointment, she has had persistent shooting lower abdominal pain that radiates to the sides. She has painful bowel movements & constipation (gets annual CSY, father w/ colon ca), deep dyspareunia, and urinary urgency & pressure with several UA w/ trace blood. No vaginal bleeding.  She is worried the fibroids are causing her pain. She is also worried about uterine and ovarian cancer. Has two maternal aunts with uterine cancer (unspecified type), a cousin with ovarian cancer, and a sister with breast ca. She reports she has already gotten BRCA testing and that she was negative.   She saw a gynecologist at [facility] to discuss fibroid management two weeks ago. She was initially interested in endometrial ablation, which was not recommended. Her gynecologist discussed & recommended hysterectomy as an alternative management for fibroids.  She has a referral to urology but has not been called to make an appointment yet.   Objective:   Vitals:   10/25/22 1009 10/25/22 1026 10/25/22 1027  BP: (!) 222/109 (!) 161/101 (!) 159/97  Pulse: 69 65 65  Weight: 216 lb (98 kg)    Height: 5\' 4"  (1.626 m)      General:  Alert, oriented and cooperative. Patient is in no acute distress.  Skin: Skin is warm and dry. No rash noted.   Cardiovascular: Normal heart rate noted   Respiratory: Normal respiratory effort, no problems with respiration noted   Assessment and Plan:  Destiny Henderson is a 62 y.o. with pelvic pain & fibroids  1. Pelvic pain, suspected pelvic floor dysfunction - Discussed that her constellation of symptoms is concerning for pelvic floor dysfunction especially since her symptoms include urinary, bowel, & GYN issues.  - Based on these symptoms, I recommend pelvic floor physical therapy as well as continued follow up with GI & urology - Reviewed that her fibroids are small and her uterine size is overall normal. Discussed that these small fibroids are very unlikely to be the source of her pain, nor are they large enough to be causing her constipation/urinary symptoms - Discussed that I would not recommend a hysterectomy for her fibroids because they are unlikely to be the source of her pain. Reviewed that she would then undergo major surgery (with all the associated risks), but we would probably not improve/treat the pain she is currently having (see more details on counseling below). Discussed that pelvic floor physical therapy is more likely to address the true source of her pain without the risk of major surgery. - Discussed lubricants & coconut oil - Ambulatory referral to Physical Therapy - Refilled ibuprofen prn  2. Fibroids - Pelvic 67 w/ overall stable appearance of fibroids and decreasing uterine volume - Discussed that I would not recommend hysterectomy at this time (as outlined above). We reviewed management options in general include progestins, GnRH agonist/antagonists, and hysterectomy. Would not recommend progestins or GnRH agonists/antagonists since she  is menopausal and not bleeding. Myomectomy would not be recommended due to small size of fibroids (risk does not outweigh beneifit).  - We also reviewed the risks of hysterectomy include but are not limited to:  Bleeding - Can bleed enough to need transfusion or need for  additional surgeries I.e. conversation to open surgery.  Infection - The vagina can develop cuff infection or a deeper infection Injury to surrounding organs/tissues (i.e. bowel/bladder/ureters) -  bladder injury requires catheter for 10-14 days after surgery Need for additional procedures - Would be specific to a complication or injury Wound complications - No specific wound unless converted to open surgery or laparoscopic Hospital re-admission - In the event of a delayed complication being recognized I.e. ureteral injury discussed Conversion to open surgery - reviewed some complications may necessitate or it may be the only way to complete the surgery.   VTE (blood clots) - Discussed risk of blood clots following delivery Medical risks - pneumonia, heart attack, stroke, death - Because of these risks and her medical conditions, I would not recommend hysterectomy at this time as the risks of hysterectomy are unlikely to outweigh benefits.  - Pt voiced concerns about cancer. We discussed that lifetime risk of ovarian cancer is approx 1-2%.  - Risk of leiomyosarcoma (uterine cancer thought to be related to fibroids) is 2.8 is 100,000 person-years (way less than 1%) - Lifetime risk of endometrial cancer is approx 2.8%.  - Reviewed that while hysterectomy is overall a safe surgery, the risk of hysterectomy is higher than her current lifetime risk of cancer with overall complication rate of 4-14%. Some studies have shown risk of hemorrhage is 2.4%, bladder injury 5-7%, ureteral injury 0.4-3.1%, surgical site infection 3%, vaginal cuff dehiscence 1%, and bowl injury <1%. - We discussed warning signs for cancer including post menopausal bleeding (endometrial), unexplained weight loss, bloating & early satiety  3. UTI symptoms - Urine Culture  Return in about 3 months (around 01/24/2023) for follow up of symptoms.  Lennart Pall, MD

## 2022-10-27 LAB — URINE CULTURE: Organism ID, Bacteria: NO GROWTH

## 2022-10-27 LAB — SPECIMEN STATUS REPORT

## 2022-10-31 ENCOUNTER — Telehealth: Payer: Self-pay | Admitting: *Deleted

## 2022-10-31 NOTE — Telephone Encounter (Signed)
Pt notified of neg urine culture per Dr Damita Dunnings.  Pt states that she did make an appt with PT for her chronic pain.

## 2022-10-31 NOTE — Telephone Encounter (Signed)
-----  Message from Inez Catalina, MD sent at 10/29/2022  1:40 PM EST ----- Negative urine culture - no MyChart. Can we send pt a letter to notify of normal result? Thanks - KF

## 2022-11-06 ENCOUNTER — Other Ambulatory Visit (HOSPITAL_BASED_OUTPATIENT_CLINIC_OR_DEPARTMENT_OTHER): Payer: Self-pay

## 2022-11-29 ENCOUNTER — Ambulatory Visit
Payer: No Typology Code available for payment source | Attending: Obstetrics and Gynecology | Admitting: Physical Therapy

## 2022-11-29 NOTE — Therapy (Deleted)
OUTPATIENT PHYSICAL THERAPY FEMALE PELVIC EVALUATION   Patient Name: Destiny Henderson MRN: [id number] DOB:08/18/1961, 62 y.o., female Today's Date: 11/29/2022  END OF SESSION:   Past Medical History:  Diagnosis Date   Anemia    Atypical chest pain 05/21/2018   Benign tumor of bladder    Dizziness 11/24/2018   Essential hypertension, benign 03/03/2013   Fatigue 10/23/2019   Fibroma of heart    Hepatitis B immune 08/01/2019   History of DVT (deep vein thrombosis) 02/06/2018   History of iron deficiency anemia 05/21/2018   LLQ pain 05/12/2019   US Showeed absent right ovary Unable to see Left ovary Fibroid uterus (2 small ones) with IUD low in Uterus FSH showed pt is in menopause May take IUD out if desires   Low back pain radiating to right leg 02/17/2018   Menorrhagia 05/20/2013   Controlled with Mirena.  Probable submucosal fibroid.    Mixed hyperlipidemia 05/21/2018   Obesity 03/03/2013   OSA (obstructive sleep apnea) 11/23/2019   Pedal edema 02/06/2018   Rheumatoid arthritis ([place]) 10/23/2019   Right knee injury, subsequent encounter 01/24/2018   Right wrist injury, subsequent encounter 01/24/2018   Sprain of MCL (medial collateral ligament) of knee 12/12/2016   Sprain of right ankle 12/12/2016   Systemic lupus erythematosus ([place]) 10/23/2019   Past Surgical History:  Procedure Laterality Date   APPENDECTOMY     BACK SURGERY     bladder tack     DILATION AND CURETTAGE OF UTERUS     after SAB   HEART TUMOR EXCISION     Patient Active Problem List   Diagnosis Date Noted   Pap smear of cervix with ASCUS, cannot exclude HGSIL on 04/17/2021 04/17/2021   Fibroma of heart    DVT (deep venous thrombosis) ([facility])    Benign tumor of bladder    Anemia    Syncope and collapse 12/31/2019   OSA (obstructive sleep apnea) 11/23/2019   Fatigue 10/23/2019   Systemic lupus erythematosus ([place]) 10/23/2019   Rheumatoid arthritis ([place]) 10/23/2019   Hepatitis B immune  08/01/2019   IUD (intrauterine device) in place 06/03/2019   LLQ pain 05/12/2019   Amenorrhea, secondary 05/12/2019   Dizziness 11/24/2018   Atypical chest pain 05/21/2018   History of iron deficiency anemia 05/21/2018   Mixed hyperlipidemia 05/21/2018   Low back pain radiating to right leg 02/17/2018   Pedal edema 02/06/2018   History of DVT (deep vein thrombosis) 02/06/2018   Right wrist injury, subsequent encounter 01/24/2018   Right knee injury, subsequent encounter 01/24/2018   Sprain of MCL (medial collateral ligament) of knee 12/12/2016   Sprain of right ankle 12/12/2016   Menorrhagia 05/20/2013   Essential hypertension, benign 03/03/2013   Morbid obesity ([place]) 03/03/2013   Obesity 03/03/2013    PCP: Judson Roch, FNP   REFERRING PROVIDER: Inez Catalina, MD   REFERRING DIAG: R10.2 (ICD-10-CM) - Pelvic pain   THERAPY DIAG:  No diagnosis found.  Rationale for Evaluation and Treatment: Rehabilitation  ONSET DATE: ***  SUBJECTIVE:  SUBJECTIVE STATEMENT: *** Fluid intake: {Yes/No:304960894}   PAIN:  Are you having pain? {yes/no:20286} NPRS scale: ***/10 Pain location: {pelvic pain location:27098}  Pain type: {type:313116} Pain description: {PAIN DESCRIPTION:21022940}   Aggravating factors: *** Relieving factors: ***  PRECAUTIONS: {Therapy precautions:24002}  WEIGHT BEARING RESTRICTIONS: {Yes ***/No:24003}  FALLS:  Has patient fallen in last 6 months? {fallsyesno:27318}  LIVING ENVIRONMENT: Lives with: {OPRC lives with:25569::"lives with their family"} Lives in: {Lives in:25570} Stairs: {opstairs:27293} Has following equipment at home: {Assistive devices:23999}  OCCUPATION: ***  PLOF: {PLOF:24004}  PATIENT GOALS: ***  PERTINENT HISTORY:  *** Sexual abuse:  {Yes/No:304960894}  BOWEL MOVEMENT: Pain with bowel movement: {yes/no:20286} Type of bowel movement:{PT BM type:27100} Fully empty rectum: {Yes/No:304960894} Leakage: {Yes/No:304960894} Pads: {Yes/No:304960894} Fiber supplement: {Yes/No:304960894}  URINATION: Pain with urination: {yes/no:20286} Fully empty bladder: {Yes/No:304960894} Stream: {PT urination:27102} Urgency: {Yes/No:304960894} Frequency: *** Leakage: {PT leakage:27103} Pads: {Yes/No:304960894}  INTERCOURSE: Pain with intercourse: {pain with intercourse PA:27099} Ability to have vaginal penetration:  {Yes/No:304960894} Climax: *** Marinoff Scale: ***/3  PREGNANCY: Vaginal deliveries *** Tearing {Yes***/No:304960894} C-section deliveries *** Currently pregnant {Yes***/No:304960894}  PROLAPSE: {PT prolapse:27101}   OBJECTIVE:   DIAGNOSTIC FINDINGS:  ***  PATIENT SURVEYS:  {rehab surveys:24030}  PFIQ-7 ***  COGNITION: Overall cognitive status: {cognition:24006}     SENSATION: Light touch: {intact/deficits:24005} Proprioception: {intact/deficits:24005}  MUSCLE LENGTH: Hamstrings: Right *** deg; Left *** deg Thomas test: Right *** deg; Left *** deg  LUMBAR SPECIAL TESTS:  {lumbar special test:25242}  FUNCTIONAL TESTS:  {Functional tests:24029}  GAIT: Distance walked: *** Assistive device utilized: {Assistive devices:23999} Level of assistance: {Levels of assistance:24026} Comments: ***  POSTURE: {posture:25561}  PELVIC ALIGNMENT:  LUMBARAROM/PROM:  A/PROM A/PROM  eval  Flexion   Extension   Right lateral flexion   Left lateral flexion   Right rotation   Left rotation    (Blank rows = not tested)  LOWER EXTREMITY ROM:  {AROM/PROM:27142} ROM Right eval Left eval  Hip flexion    Hip extension    Hip abduction    Hip adduction    Hip internal rotation    Hip external rotation    Knee flexion    Knee extension    Ankle dorsiflexion    Ankle plantarflexion    Ankle  inversion    Ankle eversion     (Blank rows = not tested)  LOWER EXTREMITY MMT:  MMT Right eval Left eval  Hip flexion    Hip extension    Hip abduction    Hip adduction    Hip internal rotation    Hip external rotation    Knee flexion    Knee extension    Ankle dorsiflexion    Ankle plantarflexion    Ankle inversion    Ankle eversion     PALPATION:   General  ***                External Perineal Exam ***                             Internal Pelvic Floor ***  Patient confirms identification and approves PT to assess internal pelvic floor and treatment {yes/no:20286}  PELVIC MMT:   MMT eval  Vaginal   Internal Anal Sphincter   External Anal Sphincter   Puborectalis   Diastasis Recti   (Blank rows = not tested)        TONE: ***  PROLAPSE: ***  TODAY'S TREATMENT:  DATE: ***  EVAL ***   PATIENT EDUCATION:  Education details: *** Person educated: {Person educated:25204} Education method: {Education Method:25205} Education comprehension: {Education Comprehension:25206}  HOME EXERCISE PROGRAM: ***  ASSESSMENT:  CLINICAL IMPRESSION: Patient is a *** y.o. *** who was seen today for physical therapy evaluation and treatment for ***.   OBJECTIVE IMPAIRMENTS: {opptimpairments:25111}.   ACTIVITY LIMITATIONS: {activitylimitations:27494}  PARTICIPATION LIMITATIONS: {participationrestrictions:25113}  PERSONAL FACTORS: {Personal factors:25162} are also affecting patient's functional outcome.   REHAB POTENTIAL: {rehabpotential:25112}  CLINICAL DECISION MAKING: {clinical decision making:25114}  EVALUATION COMPLEXITY: {Evaluation complexity:25115}   GOALS: Goals reviewed with patient? {yes/no:20286}  SHORT TERM GOALS: Target date: ***  *** Baseline: Goal status: {GOALSTATUS:25110}  2.  *** Baseline:  Goal status:  {GOALSTATUS:25110}  3.  *** Baseline:  Goal status: {GOALSTATUS:25110}  4.  *** Baseline:  Goal status: {GOALSTATUS:25110}  5.  *** Baseline:  Goal status: {GOALSTATUS:25110}  6.  *** Baseline:  Goal status: {GOALSTATUS:25110}  LONG TERM GOALS: Target date: ***  *** Baseline:  Goal status: {GOALSTATUS:25110}  2.  *** Baseline:  Goal status: {GOALSTATUS:25110}  3.  *** Baseline:  Goal status: {GOALSTATUS:25110}  4.  *** Baseline:  Goal status: {GOALSTATUS:25110}  5.  *** Baseline:  Goal status: {GOALSTATUS:25110}  6.  *** Baseline:  Goal status: {GOALSTATUS:25110}  PLAN:  PT FREQUENCY: {rehab frequency:25116}  PT DURATION: {rehab duration:25117}  PLANNED INTERVENTIONS: {rehab planned interventions:25118::"Therapeutic exercises","Therapeutic activity","Neuromuscular re-education","Balance training","Gait training","Patient/Family education","Self Care","Joint mobilization"}  PLAN FOR NEXT SESSION: ***   Camillo Flaming Kamauri Kathol, PT 11/29/2022, 8:49 AM

## 2023-01-16 ENCOUNTER — Telehealth: Payer: Self-pay | Admitting: *Deleted

## 2023-01-16 NOTE — Telephone Encounter (Signed)
Returned call from 1:14 PM. Left patient a message to call back.

## 2023-01-30 ENCOUNTER — Ambulatory Visit: Payer: No Typology Code available for payment source | Admitting: Obstetrics and Gynecology

## 2023-02-11 ENCOUNTER — Telehealth: Payer: Self-pay | Admitting: *Deleted

## 2023-02-11 NOTE — Telephone Encounter (Signed)
Returned call from 9:35 AM. Left patient a message to call and schedule annual.

## 2023-02-25 ENCOUNTER — Telehealth: Payer: Self-pay | Admitting: *Deleted

## 2023-02-25 ENCOUNTER — Encounter: Payer: Self-pay | Admitting: *Deleted

## 2023-02-25 NOTE — Telephone Encounter (Signed)
Returned call from 11:15 AM. Patient stated that she had called 3 times prior to now. Patient was advised that her phone call from 02/11/2023 was returned and that the office had not received any further messages until today. Patient advised that she will have to pay $100.00 prior to scheduling. Patient stated that she has already mailed her payment, but did not provide where she mailed payment. $50.00 no show fee was just applied to her balance prior to the office calling Loyce Dys prior to calling the patient back to schedule. Patient stated that she will pay over the phone, but will need to be refunded money that was mailed when she comes for her appointment. Patient was advised that the office is not able to refund money that was mailed. It would be returned from whomever she sent it to. Patient was advised that the office will have Loyce Dys call her for further details. Loyce Dys was contacted and stated that she will call patient.

## 2023-02-25 NOTE — Telephone Encounter (Signed)
Telephone call to patient per patient request.  Left voicemail for her to call back.    Patient has been verbally abusive to our staff and is being discharged from our practice.

## 2024-05-01 HISTORY — PX: OTHER SURGICAL HISTORY: SHX169

## 2024-05-14 ENCOUNTER — Encounter: Payer: Self-pay | Admitting: Gynecologic Oncology

## 2024-05-14 NOTE — H&P (View-Only) (Signed)
 GYNECOLOGIC ONCOLOGY NEW PATIENT CONSULTATION   Patient Name: Destiny Henderson  Patient Age: 63 y.o. Date of Service: 05/15/24 Referring Provider: Devere Hila, MD  Primary Care Provider: Anniece Felix, FNP Consulting Provider: [name], MD   Assessment/Plan:  Postmenopausal patient with EIN.   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     Patient desires to proceed with surgical management.  The patient is a suitable candidate for hysterectomy via a minimally invasive approach to surgery.  Given her age, I recommend USO (has had one tube and ovary previously removed) at the time of surgery.  We reviewed that robotic assistance would be used to complete the surgery.     We discussed that endometrial cancer is detected in about 40% of final uterine pathology specimens from patients with EIN.  Given pathology from her recent procedure showing focal EIN in a polyp specimen (endometrial curettings negative), I would estimate her risk of concurrent cancer is much lower than 40%.   We reviewed 3 options at the time of surgery.  The first would be to send the uterus for frozen section at the time of surgery.  If cancer found, lymphadenectomy may be indicated.  The section option would be to inject ICG and perform sentinel lymph node mapping but still send the uterus for frozen section to help determine whether sentinel lymph nodes required removal.  The third would be to inject ICG and perform sentinel lymph node biopsy without sending the uterus for frozen.  This would mean potentially removing sentinel lymph nodes unnecessarily.  Given EIN appeared to be confined to a polyp, I would not recommend that we proceed with lymph node biopsy and less indicated at the time of surgery. I recommended that we either perform sentinel lymph node mapping but await  removal until uterus has been examined by pathology during surgery or proceed with hysterectomy and send it for frozen section.  Our discussion, she would like to proceed with sentinel lymph node mapping (without biopsy) and frozen section.   Potential benefits of sentinel nodes including a higher detection rate for metastasis due to ultrastaging and potential reduction in operative morbidity. However, there remains uncertainty as to the role for treatment of micrometastatic disease. Further, the benefit of operative morbidity associated with the SLN technique in endometrial cancer is not yet completely known. In other patient populations (e.g. the cervical cancer population) there has been observed reductions in morbidity with SLN biopsy compared to pelvic lymphadenectomy. Lymphedema, nerve dysfunction and lymphocysts are all potential risks with the SLN technique as with complete lymphadenectomy. Additional risks to the patient include the risk of damage to an internal organ while operating in an altered view (e.g. the black and Kiper image of the robotic fluorescence imaging mode).    I believe we reviewed the use of hormonal therapy for treatment of EIN in patients who are not candidates for definitive surgery or prefer not to undergo major surgery.  The patient would like to move forward with surgery.  We discussed the plan for a robotic assisted hysterectomy, bilateral salpingo-oophorectomy, possible sentinel lymph node evaluation, possible lymph node dissection, possible laparotomy. The risks of surgery were discussed in detail and she understands these to include infection; wound separation; hernia; vaginal cuff separation, injury to adjacent organs such as bowel, bladder, blood vessels, ureters and nerves; bleeding which may require blood transfusion; anesthesia risk; thromboembolic events; possible  death; unforeseen complications; possible need for re-exploration; medical complications such as heart  attack, stroke, pleural effusion and pneumonia; and, if full lymphadenectomy is performed the risk of lymphedema and lymphocyst. The patient will receive DVT and antibiotic prophylaxis as indicated. She voiced a clear understanding. She had the opportunity to ask questions. Perioperative instructions were reviewed with her. Prescriptions for post-op medications were sent to her pharmacy of choice.  Plan will be for patient to be observed overnight after surgery.  I have asked her to hold her Tirzepatide as well as supplements.  She has a history of a provoked DVT after an injury to her left leg.  This was approximately 6 years ago.  We discussed risks and benefits of short course of prophylactic blood thinner for 2 weeks after surgery.  We will evaluate how mobile she is in the ED at postoperative period and decide on course of prophylactic anticoagulation prior to her discharge from the hospital.  A copy of this note was sent to the patient's referring provider.   80 minutes of total time was spent for this patient encounter, including preparation, face-to-face counseling with the patient and coordination of care, and documentation of the encounter.  [name], MD  Division of Gynecologic Oncology  Department of Obstetrics and Gynecology  [facility]  ___________________________________________  Chief Complaint: Chief Complaint  Patient presents with   Complex endometrial hyperplasia     History of Present Illness:  Destiny Henderson is a 63 y.o. y.o. female who is seen in consultation at the request of Devere Hila, MD for an evaluation of EIN.  Patient initially presented with lower abdominal pain described as persistent midline cramping or aching with occasional sharp shooting pains that radiate from her lower abdomen to her sides.  Had an episode of bleeding in April that resolved. In mid-April, she was seen in the emergency department with  nausea, emesis, fatigue, and abdominal pain.  CT of the abdomen and pelvis was performed at that time and was without acute abnormalities.  Specifically, 16 mm intramural fibroid within the uterus noted, no other uterine abnormalities or adnexal lesions noted.  No adenopathy. Pelvic ultrasound on 01/18/24 revealed a uterus measuring 7.6 x 5.6 x 4 cm with a thickened endometrium measuring 9.7 mm with color flow and possible polyps.  Several fibroids seen measuring up to 1.6 cm.  Right ovary surgically absent.  Left ovary normal in appearance.  No free fluid. Endometrial biopsy from 02/26/2024: Atrophic endometrium with focal stromal breakdown. Given suspected polyp based on ultrasound findings, patient underwent hysteroscopy with endometrial polypectomy and myomectomy on 7/25.  Findings at the time of surgery included an endometrial polyp arising from the posterior aspect of the lower uterine segment superior to a submucosal myoma. 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  History is notable for an ASC-H Pap in 2022, negative for HPV at that time.  She underwent colposcopy with cervical biopsy and ECC.  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  Follow-up Pap test in 2023 and 2024 both [facility].  Office notes report negative Pap in 02/2024 (patient confirms this today).  Per chart review, she also had a longstanding history of hematuria and has followed with urology.  Her myRisk hereditary cancer test was negative for clinically significant germline mutations.  Her lifetime breast cancer risk score calculated to be 10.3%.  Today, the patient comes in with her  sister and daughter.  She notes overall doing well.  Vaginal bleeding has stopped since her procedure.  Continues to have a tan/brown discharge.  Has some intermittent dull pain in her vaginal area since the surgery which she describes as  a toothache pain with occasional sharp piercing pain along her right pelvis.  She denies any urinary symptoms.  She endorses a history of constipation, takes Linzess  for this, denies any recent changes.  Reports some decreased appetite with recent diagnosis.  Had nausea last night during work related to anxiety.  Denies any recent weight changes.  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  PAST MEDICAL HISTORY:  Past Medical History:  Diagnosis Date   Anemia    Anxiety    Asthma    inhaler - uses twice monthly   Atypical chest pain 05/21/2018   Benign tumor of bladder    Diabetes (HCC)    Dizziness 11/24/2018   Essential hypertension, benign 03/03/2013   Fatigue 10/23/2019   Fibroma of heart    Fibromyalgia    Hepatitis B immune 08/01/2019   History of DVT (deep vein thrombosis) 02/06/2018   left leh from injury   History of iron deficiency anemia 05/21/2018   LLQ pain 05/12/2019   US  Showeed absent right ovary Unable to see Left ovary Fibroid uterus (2 small ones) with IUD low in Uterus FSH showed pt is in menopause May take IUD out if desires   Low back pain radiating to right leg 02/17/2018   Menorrhagia 05/20/2013   Controlled with Mirena .  Probable submucosal fibroid.    Migraines    Mixed hyperlipidemia 05/21/2018   Obesity 03/03/2013   OSA (obstructive sleep apnea) 11/23/2019   uses CPAP   Pedal edema 02/06/2018   PTSD (post-traumatic stress disorder)    Rheumatoid arthritis (HCC) 10/23/2019   Right knee injury, subsequent encounter 01/24/2018   Right wrist injury, subsequent encounter 01/24/2018   Sprain of MCL (medial collateral ligament) of knee 12/12/2016   Sprain of right ankle 12/12/2016     PAST SURGICAL HISTORY:  Past Surgical History:  Procedure Laterality Date   APPENDECTOMY  2024   BACK SURGERY  1993   l4 and l5   bladder tack  1988   ECTOPIC PREGNANCY SURGERY Right 1988   right tube and ovary removed   HEART TUMOR EXCISION  1997   HYSTEROSCOPY,RMV  MYOMA  HYSTEROSCOPY WITH DILATATION AND CURETTAGE WITH MYOSURE POLYPECTOMY   05/01/2024    OB/GYN HISTORY:  OB History  Gravida Para Term Preterm AB Living  4 3 3  1 3   SAB IAB Ectopic Multiple Live Births  1        # Outcome Date GA Lbr Len/2nd Weight Sex Type Anes PTL Lv  4 SAB           3 Term      Vag-Spont     2 Term      Vag-Spont     1 Term      Vag-Spont       No LMP recorded (lmp unknown). Patient is postmenopausal.  Age at menarche: 54  Age at menopause: 81 Hx of HRT: denies Hx of STDs: denies Last pap: see HPI History of abnormal pap smears: see HPI  SCREENING STUDIES:  Last mammogram: 2025  Last colonoscopy: 2022  MEDICATIONS: Outpatient Encounter Medications as of 05/15/2024  Medication Sig   albuterol  (PROVENTIL ) (2.5 MG/3ML) 0.083% nebulizer solution Inhale 2.5 mg into the lungs.  ascorbic acid (VITAMIN C) 100 MG tablet Take 100 mg by mouth.   ASHWAGANDHA PO Take 1 tablet by mouth daily.   azelastine (ASTELIN) 0.1 % nasal spray Place 1 spray into both nostrils 2 (two) times daily. Use in each nostril as directed   chlorthalidone (HYGROTON) 25 MG tablet Take 12.5 mg by mouth daily.   Cholecalciferol (VITAMIN D -3 PO) Take 1 tablet by mouth daily.   DULoxetine  (CYMBALTA ) 30 MG capsule Take 30 mg by mouth daily.   ECHINACEA PO Take 1 tablet by mouth daily.   ELDERBERRY PO Take 1 tablet by mouth daily.   esomeprazole (NEXIUM) 40 MG capsule Take 40 mg by mouth 2 (two) times daily before a meal.   fish oil-omega-3 fatty acids 1000 MG capsule Take 2 g by mouth daily.   Flaxseed, Linseed, 1000 MG CAPS Take by mouth.   Ginger, Zingiber officinalis, (GINGER PO) Take by mouth.   GINSENG PO Take 1 tablet by mouth daily.   hydroxychloroquine (PLAQUENIL) 200 MG tablet Take 1.5 tablets by mouth.   hydrOXYzine  (VISTARIL ) 25 MG capsule Take 25 mg by mouth.   ibuprofen  (ADVIL ) 800 MG tablet Take 1 tablet (800 mg total) by mouth every 8 (eight) hours as needed (pain).    ipratropium-albuterol  (DUONEB) 0.5-2.5 (3) MG/3ML SOLN Inhale 3 mLs into the lungs.   MAGNESIUM PO Take 1 tablet by mouth daily.   Multiple Vitamins-Minerals (MEGA MULTIVITAMIN FOR WOMEN) TABS Take by mouth.   OVER THE COUNTER MEDICATION Tumeric   OVER THE COUNTER MEDICATION Hemp Oil   OVER THE COUNTER MEDICATION Sea weed   pregabalin  (LYRICA ) 50 MG capsule Take 50 mg by mouth 3 (three) times daily.   rizatriptan  (MAXALT -MLT) 5 MG disintegrating tablet Take 5 mg by mouth.   spironolactone  (ALDACTONE ) 25 MG tablet Take 25 mg by mouth daily.   tirzepatide (ZEPBOUND) 10 MG/0.5ML Pen Inject 10 mg into the skin.   tiZANidine (ZANAFLEX) 4 MG capsule Take 4 mg by mouth 3 (three) times daily as needed for muscle spasms.   topiramate  (TOPAMAX ) 100 MG tablet Take 200 mg by mouth daily.   traMADol  (ULTRAM ) 50 MG tablet Take 50 mg by mouth every 6 (six) hours as needed.   traZODone  (DESYREL ) 100 MG tablet Take 100 mg by mouth at bedtime.   vitamin A 3 MG (10000 UNITS) capsule Take 10,000 Units by mouth.   vitamin B-12 (CYANOCOBALAMIN) 1000 MCG tablet Take 1,000 mcg by mouth daily.   VITAMIN E PO Take by mouth.   b complex vitamins capsule Take 1 capsule by mouth daily. (Patient not taking: Reported on 05/14/2024)   hydrALAZINE  (APRESOLINE ) 50 MG tablet Take 1 tablet (50 mg total) by mouth in the morning and at bedtime. (Patient not taking: Reported on 05/14/2024)   telmisartan -hydrochlorothiazide  (MICARDIS  HCT) 80-25 MG tablet Take 1 tablet by mouth daily.   [DISCONTINUED] Melatonin 5 MG CAPS Take by mouth as needed.  (Patient not taking: Reported on 05/14/2024)   [DISCONTINUED] methocarbamol  (ROBAXIN ) 500 MG tablet Take 1 tablet (500 mg total) by mouth 3 (three) times daily as needed. (Patient not taking: Reported on 05/14/2024)   [DISCONTINUED] metoCLOPramide  (REGLAN ) 5 MG tablet Take 1 tablet (5 mg total) by mouth 4 (four) times daily. (Patient not taking: Reported on 05/14/2024)   [DISCONTINUED] OZEMPIC , 1  MG/DOSE, 4 MG/3ML SOPN Inject 1 mg as directed every 7 (seven) days. (Patient not taking: Reported on 05/14/2024)   [DISCONTINUED] Semaglutide , 1 MG/DOSE, (OZEMPIC , 1 MG/DOSE,) 4 MG/3ML SOPN  Inject 1 mg into the skin once a week. (Patient not taking: Reported on 05/14/2024)   [DISCONTINUED] Semaglutide , 2 MG/DOSE, (OZEMPIC , 2 MG/DOSE,) 8 MG/3ML SOPN Inject 2 mg into the skin once a week. (Patient not taking: Reported on 05/14/2024)   [DISCONTINUED] vitamin C (ASCORBIC ACID) 500 MG tablet Take 500 mg by mouth daily.   [DISCONTINUED] VITAMIN D , CHOLECALCIFEROL, PO Take by mouth.   No facility-administered encounter medications on file as of 05/15/2024.    ALLERGIES:  Allergies  Allergen Reactions   Latex Rash    Other reaction(s): Chest Pain   Lisinopril  Swelling    Angioedema    Other Itching, Other (See Comments) and Rash   Sulfa  Antibiotics Rash and Hives    Allscripts Description: Sulfa  Drugs  Provider: Caprice Givens CFM - Allergy Description: Sulfa  Drugs CFM - Allergy Annotation:Allscripts Description: Sulfa  Drugs  Other reaction(s): Unknown  Allscripts Description: Sulfa  Drugs Allscripts Description: Sulfa  Drugs Provider: Caprice Givens CFM - Allergy Description: Sulfa  Drugs CFM - Allergy Annotation:Allscripts Description: Sulfa  Drugs Provider: Caprice Givens [facility] - Allergy Description: Sulfa  Drugs CFM - Allergy Annotation:Allscripts Description: Sulfa  Drugs  Other reaction(s): Unknown Allscripts Description: Sulfa  Drugs Allscripts Description: Sulfa  Drugs Provider: Caprice Givens CFM - Allergy Description: Sulfa  Drugs CFM - Allergy Annotation:Allscripts Description: Sulfa  Drugs Provider: Caprice Givens CFM - Allergy Description: Sulfa  Drugs CFM - Allergy Annotation:Allscripts Description: Sulfa  Drugs  Other reaction(s): Unknown Allscripts Description: Sulfa  Drugs  Provider: Caprice Givens CFM - Allergy Description: Sulfa  Drugs CFM - Allergy Annotation:Allscripts  Description: Sulfa  Drugs  Other reaction(s): Unknown  Allscripts Description: Sulfa  Drugs Allscripts Description: Sulfa  Drugs Provider: Caprice Givens CFM - Allergy Description: Sulfa  Drugs CFM - Allergy Annotation:Allscripts Description: Sulfa  Drugs Provider: Caprice Givens CFM - Allergy Description: Sulfa  Drugs CFM - Allergy Annotation:Allscripts Description: Sulfa  Drugs  Other reaction(s): Unknown    Sulfur Hives and Rash   Cephalosporins Rash   Fluticasone Furoate-Vilanterol Rash and Swelling   Lasix  [Furosemide ] Rash and Other (See Comments)    Shortness of breath   Nitrofurantoin  Rash   Aspirin      rash   Nirmatrelvir-Ritonavir Other (See Comments)   Penicillins Hives    Provider: Caprice Givens CFM - Allergy Description: Penicillins CFM - Allergy Annotation:  Allscripts Description: Penicillins   Pollen Extract Other (See Comments)   Elemental Sulfur Rash   Prednisone  Rash     FAMILY HISTORY:  Family History  Problem Relation Age of Onset   Breast cancer Mother    Hypertension Mother    Hyperlipidemia Mother    Colon cancer Father    Throat cancer Father    Prostate cancer Father    Thyroid  disease Sister    Breast cancer Sister    Thyroid  disease Sister    Heart failure Maternal Grandmother    Prostate cancer Maternal Grandfather    Cancer Maternal Grandfather        prostate   Ovarian cancer Paternal Grandmother    Colon cancer Paternal Grandfather    Hypertension Maternal Aunt    Heart failure Maternal Aunt    Cancer Maternal Aunt    Stomach cancer Maternal Aunt    Hypertension Maternal Aunt    Ovarian cancer Maternal Aunt    Hypertension Maternal Uncle    Diabetes Maternal Uncle    Cancer Other    Brain cancer Paternal Aunt    Endometrial cancer Neg Hx    Pancreatic cancer Neg Hx      SOCIAL HISTORY:  Social Connections: Moderately Integrated (04/06/2022)  Received from [facility]   Social Connection and Isolation Panel    In a  typical week, how many times do you talk on the phone with family, friends, or neighbors?: More than three times a week    How often do you get together with friends or relatives?: Once a week    How often do you attend church or religious services?: More than 4 times per year    Do you belong to any clubs or organizations such as church groups, unions, fraternal or athletic groups, or school groups?: Yes    How often do you attend meetings of the clubs or organizations you belong to?: 1 to 4 times per year    Are you married, widowed, divorced, separated, never married, or living with a partner?: Separated    REVIEW OF SYSTEMS:  + headache, anxiety, discharge Denies appetite changes, fevers, chills, fatigue, unexplained weight changes. Denies hearing loss, neck lumps or masses, mouth sores, ringing in ears or voice changes. Denies cough or wheezing.  Denies shortness of breath. Denies chest pain or palpitations. Denies leg swelling. Denies abdominal distention, pain, blood in stools, constipation, diarrhea, nausea, vomiting, or early satiety. Denies pain with intercourse, dysuria, frequency, hematuria or incontinence. Denies hot flashes, pelvic pain, vaginal bleeding.   Denies joint pain, back pain or muscle pain/cramps. Denies itching, rash, or wounds. Denies dizziness, numbness or seizures. Denies swollen lymph nodes or glands, denies easy bruising or bleeding. Denies depression, confusion, or decreased concentration.  Physical Exam:  Vital Signs for this encounter:  Blood pressure 139/82, pulse 72, temperature 98.7 F (37.1 C), temperature source Oral, resp. rate 19, height 5' 4 (1.626 m), weight 217 lb (98.4 kg), SpO2 100%. Body mass index is 37.25 kg/m. General: Alert, oriented, no acute distress.  HEENT: Normocephalic, atraumatic. Sclera anicteric.  Chest: Clear to auscultation bilaterally. No wheezes, rhonchi, or rales. Cardiovascular: Regular rate and rhythm, no murmurs,  rubs, or gallops.  Abdomen: Obese. Normoactive bowel sounds. Soft, nondistended, nontender to palpation. No masses or hepatosplenomegaly appreciated. No palpable fluid wave.  Extremities: Grossly normal range of motion. Warm, well perfused. No edema bilaterally.  Skin: No rashes or lesions.  Lymphatics: No cervical, supraclavicular, or inguinal adenopathy.  GU:  Normal external female genitalia.  No lesions. No discharge or bleeding.             Bladder/urethra:  No lesions or masses, well supported bladder             Vagina: Well-rugated, no vaginal lesions.             Cervix: Normal appearing, no lesions.             Uterus: Small, mobile, no parametrial involvement or nodularity.             Adnexa: No masses appreciated.  Rectal: Deferred. Some stool burden appreciated on vaginal exam.  LABORATORY AND RADIOLOGIC DATA:  Outside medical records were reviewed to synthesize the above history, along with the history and physical obtained during the visit.   Lab Results  Component Value Date   WBC 6.6 05/05/2021   HGB 12.9 05/05/2021   HCT 40.7 05/05/2021   PLT 410 05/05/2021   GLUCOSE 91 05/05/2021   CHOL 213 (H) 05/05/2021   TRIG 91 05/05/2021   HDL 54 05/05/2021   LDLCALC 143 (H) 05/05/2021   ALT 17 05/05/2021   AST 22 05/05/2021   NA 140 05/05/2021   K 4.9 05/05/2021   CL  100 05/05/2021   CREATININE 1.15 (H) 05/05/2021   BUN 10 05/05/2021   CO2 23 05/05/2021   TSH 1.100 05/05/2021   HGBA1C 5.5 05/05/2021

## 2024-05-14 NOTE — Progress Notes (Unsigned)
 GYNECOLOGIC ONCOLOGY NEW PATIENT CONSULTATION   Patient Name: Destiny Henderson Destiny Henderson  Patient Age: 63 y.o. Date of Service: 05/15/24 Referring Provider: Devere Hila, MD  Primary Care Provider: Anniece Felix, FNP Consulting Provider: [name], MD   Assessment/Plan:  Postmenopausal patient with EIN.   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     Patient desires to proceed with surgical management.  The patient is a suitable candidate for hysterectomy via a minimally invasive approach to surgery.  Given her age, I recommend USO (has had one tube and ovary previously removed) at the time of surgery.  We reviewed that robotic assistance would be used to complete the surgery.     We discussed that endometrial cancer is detected in about 40% of final uterine pathology specimens from patients with EIN.  Given pathology from her recent procedure showing focal EIN in a polyp specimen (endometrial curettings negative), I would estimate her risk of concurrent cancer is much lower than 40%.   We reviewed 3 options at the time of surgery.  The first would be to send the uterus for frozen section at the time of surgery.  If cancer found, lymphadenectomy may be indicated.  The section option would be to inject ICG and perform sentinel lymph node mapping but still send the uterus for frozen section to help determine whether sentinel lymph nodes required removal.  The third would be to inject ICG and perform sentinel lymph node biopsy without sending the uterus for frozen.  This would mean potentially removing sentinel lymph nodes unnecessarily.  Given EIN appeared to be confined to a polyp, I would not recommend that we proceed with lymph node biopsy and less indicated at the time of surgery. I recommended that we either perform sentinel lymph node mapping but await  removal until uterus has been examined by pathology during surgery or proceed with hysterectomy and send it for frozen section.  Our discussion, she would like to proceed with sentinel lymph node mapping (without biopsy) and frozen section.   Potential benefits of sentinel nodes including a higher detection rate for metastasis due to ultrastaging and potential reduction in operative morbidity. However, there remains uncertainty as to the role for treatment of micrometastatic disease. Further, the benefit of operative morbidity associated with the SLN technique in endometrial cancer is not yet completely known. In other patient populations (e.g. the cervical cancer population) there has been observed reductions in morbidity with SLN biopsy compared to pelvic lymphadenectomy. Lymphedema, nerve dysfunction and lymphocysts are all potential risks with the SLN technique as with complete lymphadenectomy. Additional risks to the patient include the risk of damage to an internal organ while operating in an altered view (e.g. the black and [place] image of the robotic fluorescence imaging mode).    I believe we reviewed the use of hormonal therapy for treatment of EIN in patients who are not candidates for definitive surgery or prefer not to undergo major surgery.  The patient would like to move forward with surgery.  We discussed the plan for a robotic assisted hysterectomy, bilateral salpingo-oophorectomy, possible sentinel lymph node evaluation, possible lymph node dissection, possible laparotomy. The risks of surgery were discussed in detail and she understands these to include infection; wound separation; hernia; vaginal cuff separation, injury to adjacent organs such as bowel, bladder, blood vessels, ureters and nerves; bleeding which may require blood transfusion; anesthesia risk; thromboembolic events; possible  death; unforeseen complications; possible need for re-exploration; medical complications such as heart  attack, stroke, pleural effusion and pneumonia; and, if full lymphadenectomy is performed the risk of lymphedema and lymphocyst. The patient will receive DVT and antibiotic prophylaxis as indicated. She voiced a clear understanding. She had the opportunity to ask questions. Perioperative instructions were reviewed with her. Prescriptions for post-op medications were sent to her pharmacy of choice.  Plan will be for patient to be observed overnight after surgery.  I have asked her to hold her Tirzepatide as well as supplements.  She has a history of a provoked DVT after an injury to her left leg.  This was approximately 6 years ago.  We discussed risks and benefits of short course of prophylactic blood thinner for 2 weeks after surgery.  We will evaluate how mobile she is in the ED at postoperative period and decide on course of prophylactic anticoagulation prior to her discharge from the hospital.  A copy of this note was sent to the patient's referring provider.   80 minutes of total time was spent for this patient encounter, including preparation, face-to-face counseling with the patient and coordination of care, and documentation of the encounter.  [name], MD  Division of Gynecologic Oncology  Department of Obstetrics and Gynecology  [facility]  ___________________________________________  Chief Complaint: Chief Complaint  Patient presents with   Complex endometrial hyperplasia     History of Present Illness:  Destiny Henderson Destiny Henderson is a 63 y.o. y.o. female who is seen in consultation at the request of Devere Hila, MD for an evaluation of EIN.  Patient initially presented with lower abdominal pain described as persistent midline cramping or aching with occasional sharp shooting pains that radiate from her lower abdomen to her sides.  Had an episode of bleeding in April that resolved. In mid-April, she was seen in the emergency department with  nausea, emesis, fatigue, and abdominal pain.  CT of the abdomen and pelvis was performed at that time and was without acute abnormalities.  Specifically, 16 mm intramural fibroid within the uterus noted, no other uterine abnormalities or adnexal lesions noted.  No adenopathy. Pelvic ultrasound on 01/18/24 revealed a uterus measuring 7.6 x 5.6 x 4 cm with a thickened endometrium measuring 9.7 mm with color flow and possible polyps.  Several fibroids seen measuring up to 1.6 cm.  Right ovary surgically absent.  Left ovary normal in appearance.  No free fluid. Endometrial biopsy from 02/26/2024: Atrophic endometrium with focal stromal breakdown. Given suspected polyp based on ultrasound findings, patient underwent hysteroscopy with endometrial polypectomy and myomectomy on 7/25.  Findings at the time of surgery included an endometrial polyp arising from the posterior aspect of the lower uterine segment superior to a submucosal myoma. 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  History is notable for an ASC-H Pap in 2022, negative for HPV at that time.  She underwent colposcopy with cervical biopsy and ECC.  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  Follow-up Pap test in 2023 and 2024 both [facility].  Office notes report negative Pap in 02/2024 (patient confirms this today).  Per chart review, she also had a longstanding history of hematuria and has followed with urology.  Her myRisk hereditary cancer test was negative for clinically significant germline mutations.  Her lifetime breast cancer risk score calculated to be 10.3%.  Today, the patient comes in with her  sister and daughter.  She notes overall doing well.  Vaginal bleeding has stopped since her procedure.  Continues to have a tan/brown discharge.  Has some intermittent dull pain in her vaginal area since the surgery which she describes as  a toothache pain with occasional sharp piercing pain along her right pelvis.  She denies any urinary symptoms.  She endorses a history of constipation, takes Linzess for this, denies any recent changes.  Reports some decreased appetite with recent diagnosis.  Had nausea last night during work related to anxiety.  Denies any recent weight changes.  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  PAST MEDICAL HISTORY:  Past Medical History:  Diagnosis Date   Anemia    Anxiety    Asthma    inhaler - uses twice monthly   Atypical chest pain 05/21/2018   Benign tumor of bladder    Diabetes (HCC)    Dizziness 11/24/2018   Essential hypertension, benign 03/03/2013   Fatigue 10/23/2019   Fibroma of heart    Fibromyalgia    Hepatitis B immune 08/01/2019   History of DVT (deep vein thrombosis) 02/06/2018   left leh from injury   History of iron deficiency anemia 05/21/2018   LLQ pain 05/12/2019   US  Showeed absent right ovary Unable to see Left ovary Fibroid uterus (2 small ones) with IUD low in Uterus FSH showed pt is in menopause May take IUD out if desires   Low back pain radiating to right leg 02/17/2018   Menorrhagia 05/20/2013   Controlled with Mirena .  Probable submucosal fibroid.    Migraines    Mixed hyperlipidemia 05/21/2018   Obesity 03/03/2013   OSA (obstructive sleep apnea) 11/23/2019   uses CPAP   Pedal edema 02/06/2018   PTSD (post-traumatic stress disorder)    Rheumatoid arthritis (HCC) 10/23/2019   Right knee injury, subsequent encounter 01/24/2018   Right wrist injury, subsequent encounter 01/24/2018   Sprain of MCL (medial collateral ligament) of knee 12/12/2016   Sprain of right ankle 12/12/2016     PAST SURGICAL HISTORY:  Past Surgical History:  Procedure Laterality Date   APPENDECTOMY  2024   BACK SURGERY  1993   l4 and l5   bladder tack  1988   ECTOPIC PREGNANCY SURGERY Right 1988   right tube and ovary removed   HEART TUMOR EXCISION  1997   HYSTEROSCOPY,RMV  MYOMA  HYSTEROSCOPY WITH DILATATION AND CURETTAGE WITH MYOSURE POLYPECTOMY   05/01/2024    OB/GYN HISTORY:  OB History  Gravida Para Term Preterm AB Living  4 3 3  1 3   SAB IAB Ectopic Multiple Live Births  1        # Outcome Date GA Lbr Len/2nd Weight Sex Type Anes PTL Lv  4 SAB           3 Term      Vag-Spont     2 Term      Vag-Spont     1 Term      Vag-Spont       No LMP recorded (lmp unknown). Patient is postmenopausal.  Age at menarche: 67  Age at menopause: 41 Hx of HRT: denies Hx of STDs: denies Last pap: see HPI History of abnormal pap smears: see HPI  SCREENING STUDIES:  Last mammogram: 2025  Last colonoscopy: 2022  MEDICATIONS: Outpatient Encounter Medications as of 05/15/2024  Medication Sig   albuterol (PROVENTIL) (2.5 MG/3ML) 0.083% nebulizer solution Inhale 2.5 mg into the lungs.  ascorbic acid (VITAMIN C) 100 MG tablet Take 100 mg by mouth.   ASHWAGANDHA PO Take 1 tablet by mouth daily.   azelastine (ASTELIN) 0.1 % nasal spray Place 1 spray into both nostrils 2 (two) times daily. Use in each nostril as directed   chlorthalidone (HYGROTON) 25 MG tablet Take 12.5 mg by mouth daily.   Cholecalciferol (VITAMIN D -3 PO) Take 1 tablet by mouth daily.   DULoxetine (CYMBALTA) 30 MG capsule Take 30 mg by mouth daily.   ECHINACEA PO Take 1 tablet by mouth daily.   ELDERBERRY PO Take 1 tablet by mouth daily.   esomeprazole (NEXIUM) 40 MG capsule Take 40 mg by mouth 2 (two) times daily before a meal.   fish oil-omega-3 fatty acids 1000 MG capsule Take 2 g by mouth daily.   Flaxseed, Linseed, 1000 MG CAPS Take by mouth.   Ginger, Zingiber officinalis, (GINGER PO) Take by mouth.   GINSENG PO Take 1 tablet by mouth daily.   hydroxychloroquine (PLAQUENIL) 200 MG tablet Take 1.5 tablets by mouth.   hydrOXYzine  (VISTARIL ) 25 MG capsule Take 25 mg by mouth.   ibuprofen  (ADVIL ) 800 MG tablet Take 1 tablet (800 mg total) by mouth every 8 (eight) hours as needed (pain).    ipratropium-albuterol (DUONEB) 0.5-2.5 (3) MG/3ML SOLN Inhale 3 mLs into the lungs.   MAGNESIUM PO Take 1 tablet by mouth daily.   Multiple Vitamins-Minerals (MEGA MULTIVITAMIN FOR WOMEN) TABS Take by mouth.   OVER THE COUNTER MEDICATION Tumeric   OVER THE COUNTER MEDICATION Hemp Oil   OVER THE COUNTER MEDICATION Sea weed   pregabalin (LYRICA) 50 MG capsule Take 50 mg by mouth 3 (three) times daily.   rizatriptan (MAXALT-MLT) 5 MG disintegrating tablet Take 5 mg by mouth.   spironolactone  (ALDACTONE ) 25 MG tablet Take 25 mg by mouth daily.   tirzepatide (ZEPBOUND) 10 MG/0.5ML Pen Inject 10 mg into the skin.   tiZANidine (ZANAFLEX) 4 MG capsule Take 4 mg by mouth 3 (three) times daily as needed for muscle spasms.   topiramate (TOPAMAX) 100 MG tablet Take 200 mg by mouth daily.   traMADol  (ULTRAM ) 50 MG tablet Take 50 mg by mouth every 6 (six) hours as needed.   traZODone (DESYREL) 100 MG tablet Take 100 mg by mouth at bedtime.   vitamin A 3 MG (10000 UNITS) capsule Take 10,000 Units by mouth.   vitamin B-12 (CYANOCOBALAMIN) 1000 MCG tablet Take 1,000 mcg by mouth daily.   VITAMIN E PO Take by mouth.   b complex vitamins capsule Take 1 capsule by mouth daily. (Patient not taking: Reported on 05/14/2024)   hydrALAZINE  (APRESOLINE ) 50 MG tablet Take 1 tablet (50 mg total) by mouth in the morning and at bedtime. (Patient not taking: Reported on 05/14/2024)   telmisartan -hydrochlorothiazide  (MICARDIS  HCT) 80-25 MG tablet Take 1 tablet by mouth daily.   [DISCONTINUED] Melatonin 5 MG CAPS Take by mouth as needed.  (Patient not taking: Reported on 05/14/2024)   [DISCONTINUED] methocarbamol  (ROBAXIN ) 500 MG tablet Take 1 tablet (500 mg total) by mouth 3 (three) times daily as needed. (Patient not taking: Reported on 05/14/2024)   [DISCONTINUED] metoCLOPramide  (REGLAN ) 5 MG tablet Take 1 tablet (5 mg total) by mouth 4 (four) times daily. (Patient not taking: Reported on 05/14/2024)   [DISCONTINUED] OZEMPIC , 1  MG/DOSE, 4 MG/3ML SOPN Inject 1 mg as directed every 7 (seven) days. (Patient not taking: Reported on 05/14/2024)   [DISCONTINUED] Semaglutide , 1 MG/DOSE, (OZEMPIC , 1 MG/DOSE,) 4 MG/3ML SOPN  Inject 1 mg into the skin once a week. (Patient not taking: Reported on 05/14/2024)   [DISCONTINUED] Semaglutide , 2 MG/DOSE, (OZEMPIC , 2 MG/DOSE,) 8 MG/3ML SOPN Inject 2 mg into the skin once a week. (Patient not taking: Reported on 05/14/2024)   [DISCONTINUED] vitamin C (ASCORBIC ACID) 500 MG tablet Take 500 mg by mouth daily.   [DISCONTINUED] VITAMIN D , CHOLECALCIFEROL, PO Take by mouth.   No facility-administered encounter medications on file as of 05/15/2024.    ALLERGIES:  Allergies  Allergen Reactions   Latex Rash    Other reaction(s): Chest Pain   Lisinopril  Swelling    Angioedema    Other Itching, Other (See Comments) and Rash   Sulfa  Antibiotics Rash and Hives    Allscripts Description: Sulfa  Drugs  Provider: Caprice Givens CFM - Allergy Description: Sulfa  Drugs CFM - Allergy Annotation:Allscripts Description: Sulfa  Drugs  Other reaction(s): Unknown  Allscripts Description: Sulfa  Drugs Allscripts Description: Sulfa  Drugs Provider: Caprice Givens CFM - Allergy Description: Sulfa  Drugs CFM - Allergy Annotation:Allscripts Description: Sulfa  Drugs Provider: Caprice Givens CFM - Allergy Description: Sulfa  Drugs CFM - Allergy Annotation:Allscripts Description: Sulfa  Drugs  Other reaction(s): Unknown Allscripts Description: Sulfa  Drugs Allscripts Description: Sulfa  Drugs Provider: Caprice Givens CFM - Allergy Description: Sulfa  Drugs CFM - Allergy Annotation:Allscripts Description: Sulfa  Drugs Provider: Caprice Givens CFM - Allergy Description: Sulfa  Drugs CFM - Allergy Annotation:Allscripts Description: Sulfa  Drugs  Other reaction(s): Unknown Allscripts Description: Sulfa  Drugs  Provider: Caprice Givens [employer] - Allergy Description: Sulfa  Drugs CFM - Allergy Annotation:Allscripts  Description: Sulfa  Drugs  Other reaction(s): Unknown  Allscripts Description: Sulfa  Drugs Allscripts Description: Sulfa  Drugs Provider: Caprice Givens CFM - Allergy Description: Sulfa  Drugs CFM - Allergy Annotation:Allscripts Description: Sulfa  Drugs Provider: Caprice Givens CFM - Allergy Description: Sulfa  Drugs CFM - Allergy Annotation:Allscripts Description: Sulfa  Drugs  Other reaction(s): Unknown    Sulfur Hives and Rash   Cephalosporins Rash   Fluticasone Furoate-Vilanterol Rash and Swelling   Lasix  [Furosemide ] Rash and Other (See Comments)    Shortness of breath   Nitrofurantoin  Rash   Aspirin      rash   Nirmatrelvir-Ritonavir Other (See Comments)   Penicillins Hives    Provider: Caprice Givens CFM - Allergy Description: Penicillins CFM - Allergy Annotation:  Allscripts Description: Penicillins   Pollen Extract Other (See Comments)   Elemental Sulfur Rash   Prednisone  Rash     FAMILY HISTORY:  Family History  Problem Relation Age of Onset   Breast cancer Mother    Hypertension Mother    Hyperlipidemia Mother    Colon cancer Father    Throat cancer Father    Prostate cancer Father    Thyroid  disease Sister    Breast cancer Sister    Thyroid  disease Sister    Heart failure Maternal Grandmother    Prostate cancer Maternal Grandfather    Cancer Maternal Grandfather        prostate   Ovarian cancer Paternal Grandmother    Colon cancer Paternal Grandfather    Hypertension Maternal Aunt    Heart failure Maternal Aunt    Cancer Maternal Aunt    Stomach cancer Maternal Aunt    Hypertension Maternal Aunt    Ovarian cancer Maternal Aunt    Hypertension Maternal Uncle    Diabetes Maternal Uncle    Cancer Other    Brain cancer Paternal Aunt    Endometrial cancer Neg Hx    Pancreatic cancer Neg Hx      SOCIAL HISTORY:  Social Connections: Moderately Integrated (04/06/2022)  Received from [facility]   Social Connection and Isolation Panel    In a  typical week, how many times do you talk on the phone with family, friends, or neighbors?: More than three times a week    How often do you get together with friends or relatives?: Once a week    How often do you attend church or religious services?: More than 4 times per year    Do you belong to any clubs or organizations such as church groups, unions, fraternal or athletic groups, or school groups?: Yes    How often do you attend meetings of the clubs or organizations you belong to?: 1 to 4 times per year    Are you married, widowed, divorced, separated, never married, or living with a partner?: Separated    REVIEW OF SYSTEMS:  + headache, anxiety, discharge Denies appetite changes, fevers, chills, fatigue, unexplained weight changes. Denies hearing loss, neck lumps or masses, mouth sores, ringing in ears or voice changes. Denies cough or wheezing.  Denies shortness of breath. Denies chest pain or palpitations. Denies leg swelling. Denies abdominal distention, pain, blood in stools, constipation, diarrhea, nausea, vomiting, or early satiety. Denies pain with intercourse, dysuria, frequency, hematuria or incontinence. Denies hot flashes, pelvic pain, vaginal bleeding.   Denies joint pain, back pain or muscle pain/cramps. Denies itching, rash, or wounds. Denies dizziness, numbness or seizures. Denies swollen lymph nodes or glands, denies easy bruising or bleeding. Denies depression, confusion, or decreased concentration.  Physical Exam:  Vital Signs for this encounter:  Blood pressure 139/82, pulse 72, temperature 98.7 F (37.1 C), temperature source Oral, resp. rate 19, height 5' 4 (1.626 m), weight 217 lb (98.4 kg), SpO2 100%. Body mass index is 37.25 kg/m. General: Alert, oriented, no acute distress.  HEENT: Normocephalic, atraumatic. Sclera anicteric.  Chest: Clear to auscultation bilaterally. No wheezes, rhonchi, or rales. Cardiovascular: Regular rate and rhythm, no murmurs,  rubs, or gallops.  Abdomen: Obese. Normoactive bowel sounds. Soft, nondistended, nontender to palpation. No masses or hepatosplenomegaly appreciated. No palpable fluid wave.  Extremities: Grossly normal range of motion. Warm, well perfused. No edema bilaterally.  Skin: No rashes or lesions.  Lymphatics: No cervical, supraclavicular, or inguinal adenopathy.  GU:  Normal external female genitalia.  No lesions. No discharge or bleeding.             Bladder/urethra:  No lesions or masses, well supported bladder             Vagina: Well-rugated, no vaginal lesions.             Cervix: Normal appearing, no lesions.             Uterus: Small, mobile, no parametrial involvement or nodularity.             Adnexa: No masses appreciated.  Rectal: Deferred. Some stool burden appreciated on vaginal exam.  LABORATORY AND RADIOLOGIC DATA:  Outside medical records were reviewed to synthesize the above history, along with the history and physical obtained during the visit.   Lab Results  Component Value Date   WBC 6.6 05/05/2021   HGB 12.9 05/05/2021   HCT 40.7 05/05/2021   PLT 410 05/05/2021   GLUCOSE 91 05/05/2021   CHOL 213 (H) 05/05/2021   TRIG 91 05/05/2021   HDL 54 05/05/2021   LDLCALC 143 (H) 05/05/2021   ALT 17 05/05/2021   AST 22 05/05/2021   NA 140 05/05/2021   K 4.9 05/05/2021   CL  100 05/05/2021   CREATININE 1.15 (H) 05/05/2021   BUN 10 05/05/2021   CO2 23 05/05/2021   TSH 1.100 05/05/2021   HGBA1C 5.5 05/05/2021

## 2024-05-15 ENCOUNTER — Inpatient Hospital Stay

## 2024-05-15 ENCOUNTER — Encounter: Payer: Self-pay | Admitting: Gynecologic Oncology

## 2024-05-15 ENCOUNTER — Inpatient Hospital Stay: Admitting: Oncology

## 2024-05-15 ENCOUNTER — Ambulatory Visit
Admission: RE | Admit: 2024-05-15 | Discharge: 2024-05-15 | Disposition: A | Discharge status: Home / Self Care | Payer: Self-pay | Source: Ambulatory Visit | Attending: Gynecologic Oncology | Admitting: Gynecologic Oncology

## 2024-05-15 ENCOUNTER — Inpatient Hospital Stay: Attending: Gynecologic Oncology | Admitting: Gynecologic Oncology

## 2024-05-15 ENCOUNTER — Telehealth: Payer: Self-pay

## 2024-05-15 ENCOUNTER — Other Ambulatory Visit: Payer: Self-pay

## 2024-05-15 VITALS — BP 139/82 | HR 72 | Temp 98.7°F | Resp 19 | Ht 64.0 in | Wt 217.0 lb

## 2024-05-15 DIAGNOSIS — Z881 Allergy status to other antibiotic agents status: Secondary | ICD-10-CM | POA: Insufficient documentation

## 2024-05-15 DIAGNOSIS — Z86018 Personal history of other benign neoplasm: Secondary | ICD-10-CM | POA: Diagnosis not present

## 2024-05-15 DIAGNOSIS — R112 Nausea with vomiting, unspecified: Secondary | ICD-10-CM | POA: Insufficient documentation

## 2024-05-15 DIAGNOSIS — Z8349 Family history of other endocrine, nutritional and metabolic diseases: Secondary | ICD-10-CM | POA: Diagnosis not present

## 2024-05-15 DIAGNOSIS — Z803 Family history of malignant neoplasm of breast: Secondary | ICD-10-CM | POA: Diagnosis not present

## 2024-05-15 DIAGNOSIS — G4733 Obstructive sleep apnea (adult) (pediatric): Secondary | ICD-10-CM | POA: Diagnosis not present

## 2024-05-15 DIAGNOSIS — R9389 Abnormal findings on diagnostic imaging of other specified body structures: Secondary | ICD-10-CM | POA: Insufficient documentation

## 2024-05-15 DIAGNOSIS — R1032 Left lower quadrant pain: Secondary | ICD-10-CM

## 2024-05-15 DIAGNOSIS — Z809 Family history of malignant neoplasm, unspecified: Secondary | ICD-10-CM | POA: Insufficient documentation

## 2024-05-15 DIAGNOSIS — Z886 Allergy status to analgesic agent status: Secondary | ICD-10-CM | POA: Insufficient documentation

## 2024-05-15 DIAGNOSIS — Z888 Allergy status to other drugs, medicaments and biological substances status: Secondary | ICD-10-CM | POA: Diagnosis not present

## 2024-05-15 DIAGNOSIS — N8502 Endometrial intraepithelial neoplasia [EIN]: Secondary | ICD-10-CM | POA: Insufficient documentation

## 2024-05-15 DIAGNOSIS — Z882 Allergy status to sulfonamides status: Secondary | ICD-10-CM | POA: Diagnosis not present

## 2024-05-15 DIAGNOSIS — Z86718 Personal history of other venous thrombosis and embolism: Secondary | ICD-10-CM | POA: Diagnosis not present

## 2024-05-15 DIAGNOSIS — E1140 Type 2 diabetes mellitus with diabetic neuropathy, unspecified: Secondary | ICD-10-CM

## 2024-05-15 DIAGNOSIS — Z833 Family history of diabetes mellitus: Secondary | ICD-10-CM | POA: Diagnosis not present

## 2024-05-15 DIAGNOSIS — Z8249 Family history of ischemic heart disease and other diseases of the circulatory system: Secondary | ICD-10-CM | POA: Insufficient documentation

## 2024-05-15 DIAGNOSIS — K0889 Other specified disorders of teeth and supporting structures: Secondary | ICD-10-CM | POA: Diagnosis not present

## 2024-05-15 DIAGNOSIS — Z79899 Other long term (current) drug therapy: Secondary | ICD-10-CM | POA: Insufficient documentation

## 2024-05-15 DIAGNOSIS — Z880 Allergy status to penicillin: Secondary | ICD-10-CM | POA: Diagnosis not present

## 2024-05-15 DIAGNOSIS — Z800 Family history of malignant neoplasm of digestive organs: Secondary | ICD-10-CM | POA: Insufficient documentation

## 2024-05-15 DIAGNOSIS — N8501 Benign endometrial hyperplasia: Secondary | ICD-10-CM

## 2024-05-15 DIAGNOSIS — Z8041 Family history of malignant neoplasm of ovary: Secondary | ICD-10-CM | POA: Insufficient documentation

## 2024-05-15 DIAGNOSIS — Z9049 Acquired absence of other specified parts of digestive tract: Secondary | ICD-10-CM | POA: Insufficient documentation

## 2024-05-15 DIAGNOSIS — I10 Essential (primary) hypertension: Secondary | ICD-10-CM | POA: Insufficient documentation

## 2024-05-15 DIAGNOSIS — I890 Lymphedema, not elsewhere classified: Secondary | ICD-10-CM | POA: Insufficient documentation

## 2024-05-15 DIAGNOSIS — Z83438 Family history of other disorder of lipoprotein metabolism and other lipidemia: Secondary | ICD-10-CM | POA: Diagnosis not present

## 2024-05-15 DIAGNOSIS — Z8042 Family history of malignant neoplasm of prostate: Secondary | ICD-10-CM | POA: Insufficient documentation

## 2024-05-15 DIAGNOSIS — R102 Pelvic and perineal pain: Secondary | ICD-10-CM | POA: Insufficient documentation

## 2024-05-15 DIAGNOSIS — D251 Intramural leiomyoma of uterus: Secondary | ICD-10-CM | POA: Diagnosis not present

## 2024-05-15 DIAGNOSIS — E669 Obesity, unspecified: Secondary | ICD-10-CM

## 2024-05-15 DIAGNOSIS — Z808 Family history of malignant neoplasm of other organs or systems: Secondary | ICD-10-CM | POA: Insufficient documentation

## 2024-05-15 LAB — BASIC METABOLIC PANEL - CANCER CENTER ONLY
Anion gap: 4 — ABNORMAL LOW (ref 5–15)
BUN: 11 mg/dL (ref 8–23)
CO2: 28 mmol/L (ref 22–32)
Calcium: 9.5 mg/dL (ref 8.9–10.3)
Chloride: 108 mmol/L (ref 98–111)
Creatinine: 0.99 mg/dL (ref 0.44–1.00)
GFR, Estimated: >60 mL/min (ref >60)
Glucose, Bld: 88 mg/dL (ref 70–99)
Potassium: 4.6 mmol/L (ref 3.5–5.1)
Sodium: 140 mmol/L (ref 135–145)

## 2024-05-15 LAB — CBC (CANCER CENTER ONLY)
HCT: 39.5 % (ref 36.0–46.0)
Hemoglobin: 13 g/dL (ref 12.0–15.0)
MCH: 29.1 pg (ref 26.0–34.0)
MCHC: 32.9 g/dL (ref 30.0–36.0)
MCV: 88.6 fL (ref 80.0–100.0)
Platelet Count: 356 K/uL (ref 150–400)
RBC: 4.46 MIL/uL (ref 3.87–5.11)
RDW: 15 % (ref 11.5–15.5)
WBC Count: 7.5 K/uL (ref 4.0–10.5)
nRBC: 0 % (ref 0.0–0.2)

## 2024-05-15 NOTE — Patient Instructions (Signed)
 Preparing for your Surgery  Plan for surgery on May 20, 2024 with Dr. [name] at [facility]. You will be scheduled for a robotic assisted total laparoscopic hysterectomy (removal of uterus and cervix), bilateral salpingo-oophorectomy (removal of both fallopian tubes and ovaries), possible sentinel lymph node biopsy.  Pre-operative Testing -You will receive a phone call from presurgical testing at [facility] to arrange for a pre-operative appointment and lab work.  -Bring your insurance card, copy of an advanced directive if applicable, medication list  -At that visit, you will be asked to sign a consent for a possible blood transfusion in case a transfusion becomes necessary during surgery.  The need for a blood transfusion is rare but having consent is a necessary part of your care.     -You should not be taking blood thinners or aspirin  at least ten days prior to surgery unless instructed by your surgeon.  -Do not take supplements such as fish oil (omega 3), red yeast rice, turmeric before your surgery. STOP TAKING AT LEAST 10 DAYS BEFORE SURGERY. You want to avoid medications with aspirin  in them including headache powders such as BC or Goody's), Excedrin migraine.  -If you are taking a GLP-1 medication/injection such as Ozempic , Mounjaro, Wegovy , this needs to be held before surgery for at least 7 days before.  Day Before Surgery at Home -You will be asked to take in a light diet the day before surgery. You will be advised you can have clear liquids up until 3 hours before your surgery.    Eat a light diet the day before surgery.  Examples including soups, broths, toast, yogurt, mashed potatoes.  AVOID GAS PRODUCING FOODS AND BEVERAGES. Things to avoid include carbonated beverages (fizzy beverages, sodas), raw fruits and raw vegetables (uncooked), or beans.   If your bowels are filled with gas, your surgeon will have difficulty visualizing your pelvic organs  which increases your surgical risks.  Your role in recovery Your role is to become active as soon as directed by your doctor, while still giving yourself time to heal.  Rest when you feel tired. You will be asked to do the following in order to speed your recovery:  - Cough and breathe deeply. This helps to clear and expand your lungs and can prevent pneumonia after surgery.  - STAY ACTIVE WHEN YOU GET HOME. Do mild physical activity. Walking or moving your legs help your circulation and body functions return to normal. Do not try to get up or walk alone the first time after surgery.   -If you develop swelling on one leg or the other, pain in the back of your leg, redness/warmth in one of your legs, please call the office or go to the Emergency Room to have a doppler to rule out a blood clot. For shortness of breath, chest pain-seek care in the Emergency Room as soon as possible. - Actively manage your pain. Managing your pain lets you move in comfort. We will ask you to rate your pain on a scale of zero to 10. It is your responsibility to tell your doctor or nurse where and how much you hurt so your pain can be treated.  Special Considerations -If you are diabetic, you may be placed on insulin after surgery to have closer control over your blood sugars to promote healing and recovery.  This does not mean that you will be discharged on insulin.  If applicable, your oral antidiabetics will be resumed when you are  tolerating a solid diet.  -Your final pathology results from surgery should be available around one week after surgery and the results will be relayed to you when available.  -FMLA forms can be faxed to [phone] and please allow 5-7 business days for completion.  Pain Management After Surgery -You will be prescribed your pain medication and bowel regimen medications before surgery so that you can have these available when you are discharged from the hospital. The pain medication is for  use ONLY AFTER surgery and a new prescription will not be given.   -Make sure that you have Tylenol  and Ibuprofen  IF YOU ARE ABLE TO TAKE THESE MEDICATIONS at home to use on a regular basis after surgery for pain control. We recommend alternating the medications every hour to six hours since they work differently and are processed in the body differently for pain relief.  -Review the attached handout on narcotic use and their risks and side effects.   Bowel Regimen -You will be prescribed Sennakot-S to take nightly to prevent constipation especially if you are taking the narcotic pain medication intermittently.  It is important to prevent constipation and drink adequate amounts of liquids. You can stop taking this medication when you are not taking pain medication and you are back on your normal bowel routine.  Risks of Surgery Risks of surgery are low but include bleeding, infection, damage to surrounding structures, re-operation, blood clots, and very rarely death.   Blood Transfusion Information (For the consent to be signed before surgery)  We will be checking your blood type before surgery so in case of emergencies, we will know what type of blood you would need.                                            WHAT IS A BLOOD TRANSFUSION?  A transfusion is the replacement of blood or some of its parts. Blood is made up of multiple cells which provide different functions. Red blood cells carry oxygen and are used for blood loss replacement. Yearby blood cells fight against infection. Platelets control bleeding. Plasma helps clot blood. Other blood products are available for specialized needs, such as hemophilia or other clotting disorders. BEFORE THE TRANSFUSION  Who gives blood for transfusions?  You may be able to donate blood to be used at a later date on yourself (autologous donation). Relatives can be asked to donate blood. This is generally not any safer than if you have received  blood from a stranger. The same precautions are taken to ensure safety when a relative's blood is donated. Healthy volunteers who are fully evaluated to make sure their blood is safe. This is blood bank blood. Transfusion therapy is the safest it has ever been in the practice of medicine. Before blood is taken from a donor, a complete history is taken to make sure that person has no history of diseases nor engages in risky social behavior (examples are intravenous drug use or sexual activity with multiple partners). The donor's travel history is screened to minimize risk of transmitting infections, such as malaria. The donated blood is tested for signs of infectious diseases, such as HIV and hepatitis. The blood is then tested to be sure it is compatible with you in order to minimize the chance of a transfusion reaction. If you or a relative donates blood, this is often done in anticipation  of surgery and is not appropriate for emergency situations. It takes many days to process the donated blood. RISKS AND COMPLICATIONS Although transfusion therapy is very safe and saves many lives, the main dangers of transfusion include:  Getting an infectious disease. Developing a transfusion reaction. This is an allergic reaction to something in the blood you were given. Every precaution is taken to prevent this. The decision to have a blood transfusion has been considered carefully by your caregiver before blood is given. Blood is not given unless the benefits outweigh the risks.  AFTER SURGERY INSTRUCTIONS  Return to work: 4-6 weeks if applicable  Activity: 1. Be up and out of the bed during the day.  Take a nap if needed.  You may walk up steps but be careful and use the hand rail.  Stair climbing will tire you more than you think, you may need to stop part way and rest.   2. No lifting or straining for 6 weeks over 10 pounds. No pushing, pulling, straining for 6 weeks.  3. No driving for 4-89 days when the  following criteria have been met: Do not drive if you are taking narcotic pain medicine and make sure that your reaction time has returned.   4. You can shower as soon as the next day after surgery. Shower daily.  Use your regular soap and water (not directly on the incision) and pat your incision(s) dry afterwards; don't rub.  No tub baths or submerging your body in water until cleared by your surgeon. If you have the soap that was given to you by pre-surgical testing that was used before surgery, you do not need to use it afterwards because this can irritate your incisions.   5. No sexual activity and nothing in the vagina for 12 weeks.  6. You may experience a small amount of clear drainage from your incisions, which is normal.  If the drainage persists, increases, or changes color please call the office.  7. Do not use creams, lotions, or ointments such as neosporin on your incisions after surgery until advised by your surgeon because they can cause removal of the dermabond glue on your incisions.    8. You may experience vaginal spotting after surgery or when the stitches at the top of the vagina begin to dissolve.  The spotting is normal but if you experience heavy bleeding, call our office.  9. Take Tylenol  or ibuprofen  first for pain if you are able to take these medications and only use narcotic pain medication for severe pain not relieved by the Tylenol  or Ibuprofen .  Monitor your Tylenol  intake to a max of 4,000 mg in a 24 hour period. You can alternate these medications after surgery.  Diet: 1. Low sodium Heart Healthy Diet is recommended but you are cleared to resume your normal (before surgery) diet after your procedure.  2. It is safe to use a laxative, such as Miralax or Colace, if you have difficulty moving your bowels before surgery. You have been prescribed Sennakot-S to take at bedtime every evening after surgery to keep bowel movements regular and to prevent constipation.     Wound Care: 1. Keep clean and dry.  Shower daily.  Reasons to call the Doctor: Fever - Oral temperature greater than 100.4 degrees Fahrenheit Foul-smelling vaginal discharge Difficulty urinating Nausea and vomiting Increased pain at the site of the incision that is unrelieved with pain medicine. Difficulty breathing with or without chest pain New calf pain especially if only  on one side Sudden, continuing increased vaginal bleeding with or without clots.   Contacts: For questions or concerns you should contact:  Dr. [name] at [phone]  Eleanor Epps, NP at [phone]  After Hours: call [phone] and have the GYN Oncologist paged/contacted (after 5 pm or on the weekends). You will speak with an after hours RN and let he or she know you have had surgery.  Messages sent via mychart are for non-urgent matters and are not responded to after hours so for urgent needs, please call the after hours number.

## 2024-05-15 NOTE — Telephone Encounter (Signed)
 Faxed over surgical optimization to PCP dr Ronal Betters.. patient has a visit on 8/12 for clearance.

## 2024-05-15 NOTE — Progress Notes (Signed)
 Patient here for a consult with Dr. Viktoria and for a pre-operative appointment prior to her scheduled surgery on 05/20/2024. She is scheduled for a robotic assisted total laparoscopic hysterectomy, bilateral salpingo-oophorectomy, possible sentinel lymph node biopsy. The surgery was discussed in detail.  See after visit summary for additional details.      Advised patient to hold all supplements starting now and also to hold her Zepbound injection until after surgery.   Discussed post-op pain management in detail including the aspects of the enhanced recovery pathway.  Advised her that a new prescription would be sent in for Oxycodone  and it is only to be used for after her upcoming surgery.  We discussed the use of tylenol  post-op and to monitor for a maximum of 4,000 mg in a 24 hour period.  Discussed bowel regimen in detail.     Discussed the use of SCDs and measures to take at home to prevent DVT including frequent mobility.  Reportable signs and symptoms of DVT discussed. Post-operative instructions discussed and expectations for after surgery. Incisional care discussed as well including reportable signs and symptoms including erythema, drainage, wound separation.     30 minutes spent with the patient.  Verbalizing understanding of material discussed. No needs or concerns voiced at the end of the visit.   Advised patient to call for any needs.  Advised that her post-operative medications had been prescribed and could be picked up at any time.    This appointment is included in the global surgical bundle as pre-operative teaching and has no charge.

## 2024-05-16 LAB — HEMOGLOBIN A1C
Hgb A1c MFr Bld: 5.4 % (ref 4.8–5.6)
Mean Plasma Glucose: 108 mg/dL

## 2024-05-18 ENCOUNTER — Encounter (HOSPITAL_COMMUNITY): Payer: Self-pay

## 2024-05-18 ENCOUNTER — Other Ambulatory Visit: Payer: Self-pay

## 2024-05-18 ENCOUNTER — Encounter (HOSPITAL_COMMUNITY)
Admission: RE | Admit: 2024-05-18 | Discharge: 2024-05-18 | Disposition: A | Discharge status: Home / Self Care | Source: Ambulatory Visit | Attending: Gynecologic Oncology | Admitting: Gynecologic Oncology

## 2024-05-18 DIAGNOSIS — G4733 Obstructive sleep apnea (adult) (pediatric): Secondary | ICD-10-CM | POA: Diagnosis not present

## 2024-05-18 DIAGNOSIS — F4310 Post-traumatic stress disorder, unspecified: Secondary | ICD-10-CM | POA: Diagnosis not present

## 2024-05-18 DIAGNOSIS — Z86718 Personal history of other venous thrombosis and embolism: Secondary | ICD-10-CM | POA: Insufficient documentation

## 2024-05-18 DIAGNOSIS — Z86018 Personal history of other benign neoplasm: Secondary | ICD-10-CM | POA: Diagnosis not present

## 2024-05-18 DIAGNOSIS — M069 Rheumatoid arthritis, unspecified: Secondary | ICD-10-CM | POA: Insufficient documentation

## 2024-05-18 DIAGNOSIS — M797 Fibromyalgia: Secondary | ICD-10-CM | POA: Diagnosis not present

## 2024-05-18 DIAGNOSIS — N8501 Benign endometrial hyperplasia: Secondary | ICD-10-CM | POA: Insufficient documentation

## 2024-05-18 DIAGNOSIS — E119 Type 2 diabetes mellitus without complications: Secondary | ICD-10-CM | POA: Insufficient documentation

## 2024-05-18 DIAGNOSIS — Z01812 Encounter for preprocedural laboratory examination: Secondary | ICD-10-CM | POA: Insufficient documentation

## 2024-05-18 DIAGNOSIS — J45909 Unspecified asthma, uncomplicated: Secondary | ICD-10-CM | POA: Insufficient documentation

## 2024-05-18 LAB — CBC
HCT: 41.1 % (ref 36.0–46.0)
Hemoglobin: 12.8 g/dL (ref 12.0–15.0)
MCH: 28.5 pg (ref 26.0–34.0)
MCHC: 31.1 g/dL (ref 30.0–36.0)
MCV: 91.5 fL (ref 80.0–100.0)
Platelets: 358 K/uL (ref 150–400)
RBC: 4.49 MIL/uL (ref 3.87–5.11)
RDW: 15.1 % (ref 11.5–15.5)
WBC: 7.9 K/uL (ref 4.0–10.5)
nRBC: 0 % (ref 0.0–0.2)

## 2024-05-18 LAB — COMPREHENSIVE METABOLIC PANEL WITH GFR
ALT: 19 U/L (ref 0–44)
AST: 20 U/L (ref 15–41)
Albumin: 3.7 g/dL (ref 3.5–5.0)
Alkaline Phosphatase: 55 U/L (ref 38–126)
Anion gap: 8 (ref 5–15)
BUN: 11 mg/dL (ref 8–23)
CO2: 23 mmol/L (ref 22–32)
Calcium: 9.3 mg/dL (ref 8.9–10.3)
Chloride: 107 mmol/L (ref 98–111)
Creatinine, Ser: 0.73 mg/dL (ref 0.44–1.00)
GFR, Estimated: >60 mL/min (ref >60)
Glucose, Bld: 90 mg/dL (ref 70–99)
Potassium: 3.8 mmol/L (ref 3.5–5.1)
Sodium: 138 mmol/L (ref 135–145)
Total Bilirubin: 0.4 mg/dL (ref 0.0–1.2)
Total Protein: 7.7 g/dL (ref 6.5–8.1)

## 2024-05-18 NOTE — Progress Notes (Addendum)
 COVID Vaccine received:  []  No [x]  Yes Date of any COVID positive Test in last 90 days: none  PCP -  Ronal Mulch, NP medical clearance appt sched. To 05-19-24  [phone] (Work)  [phone] (Fax)  Cardiologist - Eva Balzarine, MD at [facility]   Debby Bane, NP (LOV [phone]) [phone] (Work) [phone] (Fax)  Psychology- Rocky Euler, MD    Chest x-ray - 03-22-2024  1v  CE EKG - 2021 epic, 03-22-2024 [facility] ED) requested Stress Test -  ECHO - 10-23-2019  Epic Cardiac Cath -  CT Coronary Calcium score:   Bowel Prep - [x]  No  []   Yes _GYN diet__  Pacemaker / ICD device [x]  No []  Yes   Spinal Cord Stimulator:[x]  No []  Yes       History of Sleep Apnea? []  No [x]  Yes   CPAP used?- []  No [x]  Yes    Patient has: []  NO Hx DM   [x]  Pre-DM   []  DM1  []   DM2 Does the patient monitor blood sugar?   []  N/A   [x]  No []  Yes  Last A1c was:  5.4  on   05-15-24   Does patient have a [employer] or Dexcom? [x]  No []  Yes   ZEPBOUND- takes on Tuesday, Last dose: 05-12-24 per patient  Blood Thinner / Instructions:  none Aspirin  Instructions:  none  ERAS Protocol Ordered: []  No  [x]  Yes PRE-SURGERY []  ENSURE  []  G2   [x]  No Drink Ordered Patient is to be NPO after: 1030  Dental hx: []  Dentures:  [x]  N/A      []  Bridge or Partial:                   []  Loose or Damaged teeth:   Activity level: Able to walk up 2 flights of stairs without becoming significantly short of breath or having chest pain?  [x]  No  would have SOB,  []    Yes  Patient can perform ADLs without assistance. 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  Patient verbalized understanding and agreement to the Pre-Surgical Instructions that were given to them at this PAT appointment.  Patient was also educated of the need to review these PAT instructions again prior to her surgery.I reviewed the appropriate phone numbers to call if they have any and questions or concerns.

## 2024-05-18 NOTE — Patient Instructions (Addendum)
 SURGICAL WAITING ROOM VISITATION Patients having surgery or a procedure may have no more than 2 support people in the waiting area - these visitors may rotate in the visitor waiting room.   If the patient needs to stay at the hospital during part of their recovery, the visitor guidelines for inpatient rooms apply.  PRE-OP VISITATION  Pre-op nurse will coordinate an appropriate time for 1 support person to accompany the patient in pre-op.  This support person may not rotate.  This visitor will be contacted when the time is appropriate for the visitor to come back in the pre-op area.  Please refer to the [facility] website for the visitor guidelines for Inpatients (after your surgery is over and you are in a regular room).  You are not required to quarantine at this time prior to your surgery. However, you must do this: Hand Hygiene often Do NOT share personal items Notify your provider if you are in close contact with someone who has COVID or you develop fever 100.4 or greater, new onset of sneezing, cough, sore throat, shortness of breath or body aches.  If you test positive for Covid or have been in contact with anyone that has tested positive in the last 10 days please notify you surgeon.    Your procedure is scheduled on:  Wednesday  May 20, 2024  Report to [facility] Main Entrance: [address] entrance where the [employer] is available.   Report to admitting at:  11:15   AM  Call this number if you have any questions or problems the morning of surgery [phone]  FOLLOW ANY ADDITIONAL PRE OP INSTRUCTIONS YOU RECEIVED FROM YOUR SURGEON'S OFFICE!!!  Do not eat food after Midnight the night prior to your surgery/procedure.  After Midnight you may have the following liquids until  10:30 AM DAY OF SURGERY  Clear Liquid Diet Water  Black Coffee (sugar ok, NO MILK/CREAM OR CREAMERS)  Tea (sugar ok, NO MILK/CREAM OR CREAMERS) regular and decaf                              Plain Jell-O  with no fruit (NO RED)                                           Fruit ices (not with fruit pulp, NO RED)                                     Popsicles (NO RED)                                                                  Juice: NO CITRUS JUICES: only apple, Pike grape, Esper cranberry Sports drinks like Gatorade or Powerade (NO RED)                 Oral Hygiene is also important to reduce your risk of infection.        Remember - BRUSH YOUR TEETH THE MORNING OF SURGERY WITH YOUR REGULAR TOOTHPASTE  Do NOT  smoke after Midnight the night before surgery.  STOP TAKING all Vitamins, Herbs and supplements 1 week before your surgery.   ZEPBOUND injection: already on Hold since 05-12-2024.   Take ONLY these medicines the morning of surgery with A SIP OF WATER : Metoprolol , topiramate , Pregabalin . You may use your nasal spray,  inhalers and Nebulizer if needed. Please bring your Albuterol  inhaler with you on the day of surgery.   DO NOT TAKE Spironolactone , Linzess  on the morning of your surgery  If You have been diagnosed with Sleep Apnea - Bring CPAP mask and tubing day of surgery. We will provide you with a CPAP machine on the day of your surgery.                   You may not have any metal on your body including hair pins, jewelry, and body piercing  Do not wear make-up, lotions, powders, perfumes  or deodorant  Do not wear nail polish including gel and S&S, artificial / acrylic nails, or any other type of covering on natural nails including finger and toenails. If you have artificial nails, gel coating, etc., that needs to be removed by a nail salon, Please have this removed prior to surgery. Not doing so may mean that your surgery could be cancelled or delayed if the Surgeon or anesthesia staff feels like they are unable to monitor you safely.   Do not shave 48 hours prior to surgery to avoid nicks in your skin which may contribute to postoperative infections.    Contacts, Hearing Aids, dentures or bridgework may not be worn into surgery. DENTURES WILL BE REMOVED PRIOR TO SURGERY PLEASE DO NOT APPLY Poly grip OR ADHESIVES!!!  You may bring a small overnight bag with you on the day of surgery, only pack items that are not valuable. [facility] IS NOT RESPONSIBLE   FOR VALUABLES THAT ARE LOST OR STOLEN.   Do not bring your home medications to the hospital. The Pharmacy will dispense medications listed on your medication list to you during your admission in the Hospital.  Please read over the following fact sheets you were given: IF YOU HAVE QUESTIONS ABOUT YOUR PRE-OP INSTRUCTIONS, PLEASE CALL [phone]   [facility] - Preparing for Surgery Before surgery, you can play an important role.  Because skin is not sterile, your skin needs to be as free of germs as possible.  You can reduce the number of germs on your skin by washing with CHG (chlorahexidine gluconate) soap before surgery.  CHG is an antiseptic cleaner which kills germs and bonds with the skin to continue killing germs even after washing. Please DO NOT use if you have an allergy to CHG or antibacterial soaps.  If your skin becomes reddened/irritated stop using the CHG and inform your nurse when you arrive at Short Stay. Do not shave (including legs and underarms) for at least 48 hours prior to the first CHG shower.  You may shave your face/neck.  Please follow these instructions carefully:  1.  Shower with CHG Soap the night before surgery and the  morning of surgery.  2.  If you choose to wash your hair, wash your hair first as usual with your normal  shampoo.  3.  After you shampoo, rinse your hair and body thoroughly to remove the shampoo.                             4.  Use CHG as you would any other liquid soap.  You can apply chg directly to the skin and wash.  Gently with a scrungie or clean washcloth.  5.  Apply the CHG Soap to your body ONLY FROM THE NECK DOWN.   Do not use on  face/ open                           Wound or open sores. Avoid contact with eyes, ears mouth and genitals (private parts).                       Wash face,  Genitals (private parts) with your normal soap.             6.  Wash thoroughly, paying special attention to the area where your  surgery  will be performed.  7.  Thoroughly rinse your body with warm water  from the neck down.  8.  DO NOT shower/wash with your normal soap after using and rinsing off the CHG Soap.            9.  Pat yourself dry with a clean towel.            10.  Wear clean pajamas.            11.  Place clean sheets on your bed the night of your first shower and do not  sleep with pets.  ON THE DAY OF SURGERY : Do not apply any lotions/deodorants the morning of surgery.  Please wear clean clothes to the hospital/surgery center.     FAILURE TO FOLLOW THESE INSTRUCTIONS MAY RESULT IN THE CANCELLATION OF YOUR SURGERY  PATIENT SIGNATURE_________________________________  NURSE SIGNATURE__________________________________  ________________________________________________________________________

## 2024-05-19 ENCOUNTER — Telehealth: Payer: Self-pay

## 2024-05-19 ENCOUNTER — Telehealth: Payer: Self-pay | Admitting: Oncology

## 2024-05-19 NOTE — Progress Notes (Signed)
 Chief Complaint  Patient presents with  . Pre-op Exam    History of Present Illness:    Pleasant 63 y.o. female presents today for preoperative checkup she is scheduled to get a total hysterectomy tomorrow at an outside system for a positive biopsy last month after removal of polyp and fibroid.  She had a biopsy in May but that 1 came back clear.  She decided to go to a different system because it is where her daughter works and she was able to get her in quickly.  She did her initial consult last Friday and has the records here with her today.  She is feeling well generally but is stressed, it has been tough to schedule everything and difficult to work in the midst of it all.  She is hopeful she will be able to get in with cardiology later today, she did have a stress test ordered but does not think she needs it.  Evidently it was ordered mainly because of a panic attack.  Reviewed pulmonology, cardiology, and gynecological oncology documentation.   Review of Systems All other pertinent positive and negative aspects of the ROS are detailed in the above HPI    Objective:     BP 144/84   Pulse 66   Ht 1.626 m (5' 4)   Wt 98.5 kg (217 lb 3.2 oz)   SpO2 100%   BMI 37.28 kg/m    Physical Exam:  General: Alert, oriented, no acute distress  ENT: Normal conjunctive a, normal hearing  Neck: Trachea midline without JVD  Cardiac: Regular rate and rhythm without murmur, gallop, or rub; no significant lower extremity edema  Respiratory: Respirations regular and nonlabored, clear to auscultation bilaterally, symmetrical expansion  Integumentary: Clean dry, intact, no pallor or diaphoresis  Musculoskeletal: Normal gait, no gross deformity  Neuro: Alert and oriented x4  Psychological: Cooperative, appropriate mood and affect        Assessment and Plan:  Diagnoses and all orders for this visit:  Preop exam for internal medicine -     Urinalysis with Reflex to  Microscopic Reviewed labs, recently done CBC and CMP.  Diabetes well-controlled.  Will no sign of UTI.  No contraindication for surgery at this time, do recommend that she follow through with cardiology referral.  Reviewed pulmonology notes, no diagnosis of COPD or chronic lung disease.  It does appear as though her sleep apnea has resolved per last study.  Morbid obesity (CMD) -     tirzepatide, weight loss, (Zepbound) 10 mg/0.5 mL subcutaneous pen injector; Inject 0.5 mL (10 mg total) under the skin every 7 days.  Prediabetes -     tirzepatide, weight loss, (Zepbound) 10 mg/0.5 mL subcutaneous pen injector; Inject 0.5 mL (10 mg total) under the skin every 7 days.    Total visit time 47 minutes including chart review, documentation and patient consultation.       Comment: Please note this report has been produced using speech recognition software and may contain errors related to that system including errors in grammar, punctuation, and spelling, as well as words and phrases that may be inappropriate. If there are any questions or concerns please feel free to contact the dictating provider for clarification.

## 2024-05-19 NOTE — Telephone Encounter (Signed)
 Called Destiny Henderson back and let her know that cardiology at [facility] has a note in Care Everywhere that they would like her to have the cardiac MRI on Friday before having surgery.  Asked if she would like to reschedule surgery for 06/11/24. Destiny Henderson agreed to reschedule surgery for 06/11/24.   Case was rescheduled with [facility] surgery scheduling to 8:30 on 06/11/24.

## 2024-05-19 NOTE — Telephone Encounter (Signed)
 I'm not 100% certain that my input at this point will make a big impact if anesthesia is indicating a stress test would be preferred. I am only going off recent OV from June and her history, but I would tend to err on the side of caution and get her planned cMRI completed this Friday to ascertain no major issues with arise cardiac wise during her surgery

## 2024-05-19 NOTE — Telephone Encounter (Signed)
 LVM for patient advising it is recommended to have the MRI prior to surgery.

## 2024-05-19 NOTE — Telephone Encounter (Signed)
  Caller and Phone #:   Anticipated Procedure Hysterectomy  Any medications your provider is asking to be held? ?  How long is the MD wanting to hold Anticoagulants? ?  MD for procedure [name]   Date of procedure 05/20/2024  Facility / Office Name [facility]  Telephone Number for MD [phone]  Fax Number for MD [phone]

## 2024-05-19 NOTE — Telephone Encounter (Signed)
 Called [facility] regarding upcoming cardiac MRI.  Destiny Henderson is scheduled for 05/22/24 at 11:00.  Advised she will need cardiac clearance before she can have surgery and asked if the MRI can be moved up to today.  They said the patient would need to call to reschedule it. Called the imaging center and left a message.  Called Destiny Henderson and let her know she will need to have cardiac clearance before surgery.  She is aware of the cardiac MRI on Friday and would be willing to go to any location to have it done today.

## 2024-05-19 NOTE — Telephone Encounter (Signed)
 Received phone call from [place] (pre-admit) who was questioning the cardiac clearance for this patient.  There was no cardiac clearance requested by MD.   Patient was seen by cardiology on 03/27/2024 and was scheduled for stress cardiac MRI and never went for testing.    Per Burnard she spoke with Dr Myer Needle and he would like for the patient to see cardiology and get the testing done before surgery.  Per Burnard surgery is cancelled at this time.

## 2024-05-19 NOTE — Telephone Encounter (Signed)
 Spoke to pt. She reports that she is scheduled to have a Hysterectomy tomorrow due to some abnormal cells found on testing. Anesthesia is telling her she will need to have Stress Test done prior to being cleared for surgery due to last OV note by Debby Bane  CP / shoulder pain / DOE - Longstanding symptoms and progression with exertional component. She was previously set up for stress cardiac MRI after appointment in 07/2022 but never ended up having testing done. Will reorder in order to evaluate for ischemia as well as reassess for any cardiac mass given prior atrial myxoma. Pt states that she is not having Cp/SOB or other symptoms and is able to walk up a flight of stairs without symptoms. She wants to know if we are able to give her Cardiac Clearance without having Cardiac Stress MRI.
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
## 2024-05-19 NOTE — Telephone Encounter (Signed)
 Left a message for Dr. Jacque nurse regarding need for cardiac clearance today.  Asked if cardiac MRI can be done today.  Requested a return call.

## 2024-05-19 NOTE — Telephone Encounter (Signed)
  Caller and Phone #: Destiny Henderson, Destiny Henderson [phone]   Main symptom and duration:   Reason for call: Pt scheduled to have surgery tomorrow and needs clearance letter sent to Dr. Katherin Tucker w/[facility] - call # [phone] - pt did not have fax #.  Pt having total hysterectomy scheduled for tomorrow at 100p.  Pls call pt when clearance sent.

## 2024-05-19 NOTE — Telephone Encounter (Signed)
 [facility] called back and does not have an opening for Destiny Henderson to have the Cardiac MRI today.

## 2024-05-20 ENCOUNTER — Telehealth: Payer: Self-pay | Admitting: Oncology

## 2024-05-20 ENCOUNTER — Encounter: Payer: Self-pay | Admitting: Obstetrics and Gynecology

## 2024-05-20 DIAGNOSIS — N8501 Benign endometrial hyperplasia: Secondary | ICD-10-CM

## 2024-05-20 NOTE — Telephone Encounter (Signed)
 Destiny Henderson left a message asking if her surgery can be sooner than 06/11/24 since she will be having the MRI on 05/22/24.    Called her back and let her know that we will try to move her surgery up and will call back if a time is available.

## 2024-05-20 NOTE — Telephone Encounter (Signed)
 Called Destiny Henderson and let her know that her surgery has been moved up to 05/28/24.  She verbalized understanding and agreement.

## 2024-05-21 ENCOUNTER — Telehealth: Payer: Self-pay | Admitting: Surgery

## 2024-05-21 ENCOUNTER — Other Ambulatory Visit (HOSPITAL_BASED_OUTPATIENT_CLINIC_OR_DEPARTMENT_OTHER): Payer: Self-pay

## 2024-05-21 ENCOUNTER — Other Ambulatory Visit: Payer: Self-pay | Admitting: Gynecologic Oncology

## 2024-05-21 DIAGNOSIS — N8501 Benign endometrial hyperplasia: Secondary | ICD-10-CM

## 2024-05-21 MED ORDER — ONDANSETRON 4 MG PO TBDP
4.0000 mg | ORAL_TABLET | Freq: Three times a day (TID) | ORAL | 0 refills | Status: AC | PRN
Start: 2024-05-21 — End: ?
  Filled 2024-05-21: qty 20, 7d supply, fill #0

## 2024-05-21 MED ORDER — OXYCODONE HCL 5 MG PO TABS
5.0000 mg | ORAL_TABLET | ORAL | 0 refills | Status: AC | PRN
  Filled 2024-05-28 (×2): qty 10, 2d supply, fill #0
  Filled ????-??-??: fill #0

## 2024-05-21 NOTE — Telephone Encounter (Signed)
 Called patient to verify her pharmacy for post op medications. Patient stated the outpatient pharmacy at the [facility] on [address] is where her medication should be sent. She also requested a refill on her 4mg  dissolving Zofran  tablets, stating she gets nauseated when she takes pain medication. Reminded patient of upcoming surgery date of 8/21 and advised patient to stop her Zepbound and herbal supplements today until after surgery. Patient verbalized understanding and had no other concerns at this time.

## 2024-05-22 NOTE — Telephone Encounter (Signed)
 Letter, and spoke to pt.

## 2024-05-22 NOTE — Progress Notes (Addendum)
 Updated date of surgery: 05/28/24  Updated time of arrival: 0945 AM  Last dose of Zepbound 05/19/24

## 2024-05-22 NOTE — Progress Notes (Signed)
 Attempted to obtain medical history via telephone, unable to reach at this time. HIPAA compliant voicemail message left requesting return call to pre surgical testing department.

## 2024-05-25 ENCOUNTER — Telehealth: Payer: Self-pay | Admitting: *Deleted

## 2024-05-25 NOTE — Anesthesia Preprocedure Evaluation (Signed)
 Anesthesia Evaluation    Airway        Dental   Pulmonary           Cardiovascular hypertension,      Neuro/Psych    GI/Hepatic   Endo/Other  diabetes    Renal/GU      Musculoskeletal   Abdominal   Peds  Hematology   Anesthesia Other Findings   Reproductive/Obstetrics                              Anesthesia Physical Anesthesia Plan  ASA:   Anesthesia Plan:    Post-op Pain Management:    Induction:   PONV Risk Score and Plan:   Airway Management Planned:   Additional Equipment:   Intra-op Plan:   Post-operative Plan:   Informed Consent:   Plan Discussed with:   Anesthesia Plan Comments: (See PAT note from 8/11)         Anesthesia Quick Evaluation

## 2024-05-25 NOTE — Telephone Encounter (Signed)
 Left message for patient to call the office.

## 2024-05-25 NOTE — Telephone Encounter (Signed)
 Destiny Henderson returned the call. She was notifies that we are able to proceed with surgery on 8/21 as scheduled. She stated that she was already notified of these results. She will get a phone call on 8/20 from our office to review preop instructions

## 2024-05-27 ENCOUNTER — Telehealth: Admitting: Gynecologic Oncology

## 2024-05-27 ENCOUNTER — Telehealth: Payer: Self-pay | Admitting: *Deleted

## 2024-05-27 NOTE — Telephone Encounter (Signed)
 Telephone call to check on pre-operative status.  Patient compliant with pre-operative instructions.  Reinforced nothing to eat after midnight. Clear liquids until 0845. Patient to arrive at 0945.  No questions or concerns voiced.  Instructed to call for any needs.

## 2024-05-27 NOTE — Telephone Encounter (Signed)
 Attempted to reach patient for pre-op call. Left voicemail requesting call back.

## 2024-05-28 ENCOUNTER — Other Ambulatory Visit (HOSPITAL_BASED_OUTPATIENT_CLINIC_OR_DEPARTMENT_OTHER): Payer: Self-pay

## 2024-05-28 ENCOUNTER — Ambulatory Visit (HOSPITAL_BASED_OUTPATIENT_CLINIC_OR_DEPARTMENT_OTHER): Payer: Self-pay | Admitting: Anesthesiology

## 2024-05-28 ENCOUNTER — Encounter (HOSPITAL_COMMUNITY)
Admission: RE | Disposition: A | Discharge status: Home / Self Care | Payer: Self-pay | Source: Home / Self Care | Attending: Gynecologic Oncology

## 2024-05-28 ENCOUNTER — Encounter (HOSPITAL_COMMUNITY): Payer: Self-pay | Admitting: Medical

## 2024-05-28 ENCOUNTER — Other Ambulatory Visit: Payer: Self-pay

## 2024-05-28 ENCOUNTER — Observation Stay (HOSPITAL_COMMUNITY)
Admission: RE | Admit: 2024-05-28 | Discharge: 2024-06-02 | Disposition: A | Discharge status: Home / Self Care | Attending: Gynecologic Oncology | Admitting: Gynecologic Oncology

## 2024-05-28 ENCOUNTER — Encounter (HOSPITAL_COMMUNITY): Payer: Self-pay | Admitting: Gynecologic Oncology

## 2024-05-28 DIAGNOSIS — N8501 Benign endometrial hyperplasia: Principal | ICD-10-CM

## 2024-05-28 DIAGNOSIS — J45909 Unspecified asthma, uncomplicated: Secondary | ICD-10-CM | POA: Diagnosis not present

## 2024-05-28 DIAGNOSIS — I10 Essential (primary) hypertension: Secondary | ICD-10-CM | POA: Insufficient documentation

## 2024-05-28 DIAGNOSIS — K660 Peritoneal adhesions (postprocedural) (postinfection): Secondary | ICD-10-CM | POA: Diagnosis not present

## 2024-05-28 DIAGNOSIS — E119 Type 2 diabetes mellitus without complications: Secondary | ICD-10-CM | POA: Insufficient documentation

## 2024-05-28 DIAGNOSIS — Z91040 Latex allergy status: Secondary | ICD-10-CM | POA: Insufficient documentation

## 2024-05-28 DIAGNOSIS — M79604 Pain in right leg: Secondary | ICD-10-CM

## 2024-05-28 DIAGNOSIS — N83209 Unspecified ovarian cyst, unspecified side: Secondary | ICD-10-CM | POA: Diagnosis not present

## 2024-05-28 DIAGNOSIS — Z01818 Encounter for other preprocedural examination: Secondary | ICD-10-CM

## 2024-05-28 DIAGNOSIS — M545 Low back pain, unspecified: Secondary | ICD-10-CM

## 2024-05-28 DIAGNOSIS — G8918 Other acute postprocedural pain: Secondary | ICD-10-CM

## 2024-05-28 DIAGNOSIS — R110 Nausea: Secondary | ICD-10-CM

## 2024-05-28 DIAGNOSIS — G4733 Obstructive sleep apnea (adult) (pediatric): Secondary | ICD-10-CM

## 2024-05-28 DIAGNOSIS — N838 Other noninflammatory disorders of ovary, fallopian tube and broad ligament: Secondary | ICD-10-CM | POA: Insufficient documentation

## 2024-05-28 DIAGNOSIS — N8502 Endometrial intraepithelial neoplasia [EIN]: Secondary | ICD-10-CM

## 2024-05-28 DIAGNOSIS — D259 Leiomyoma of uterus, unspecified: Secondary | ICD-10-CM | POA: Diagnosis not present

## 2024-05-28 DIAGNOSIS — R1030 Lower abdominal pain, unspecified: Secondary | ICD-10-CM | POA: Diagnosis present

## 2024-05-28 DIAGNOSIS — R7989 Other specified abnormal findings of blood chemistry: Secondary | ICD-10-CM

## 2024-05-28 HISTORY — PX: ROBOTIC ASSISTED TOTAL HYSTERECTOMY WITH BILATERAL SALPINGO OOPHERECTOMY: SHX6086

## 2024-05-28 HISTORY — PX: INJECTION, FOR SENTINEL LYMPH NODE IDENTIFICATION: SHX7598

## 2024-05-28 LAB — GLUCOSE, CAPILLARY
Glucose-Capillary: 75 mg/dL (ref 70–99)
Glucose-Capillary: 72 mg/dL (ref 70–99)
Glucose-Capillary: 103 mg/dL — ABNORMAL HIGH (ref 70–99)

## 2024-05-28 LAB — TYPE AND SCREEN
ABO/RH(D): O POS
Antibody Screen: NEGATIVE

## 2024-05-28 LAB — ABO/RH: ABO/RH(D): O POS

## 2024-05-28 SURGERY — HYSTERECTOMY, TOTAL, ROBOT-ASSISTED, LAPAROSCOPIC, WITH BILATERAL SALPINGO-OOPHORECTOMY
Anesthesia: General

## 2024-05-28 MED ORDER — STERILE WATER FOR INJECTION IJ SOLN
INTRAMUSCULAR | Status: AC
  Filled 2024-05-28: qty 10

## 2024-05-28 MED ORDER — PHENYLEPHRINE HCL (PRESSORS) 10 MG/ML IV SOLN
INTRAVENOUS | Status: AC
  Filled 2024-05-28: qty 1

## 2024-05-28 MED ORDER — LIDOCAINE HCL (CARDIAC) PF 100 MG/5ML IV SOSY
PREFILLED_SYRINGE | INTRAVENOUS | Status: DC | PRN
  Administered 2024-05-28: 100 mg via INTRAVENOUS

## 2024-05-28 MED ORDER — DROPERIDOL 2.5 MG/ML IJ SOLN
0.6250 mg | Freq: Once | INTRAMUSCULAR | Status: AC | PRN
  Administered 2024-05-28: 0.625 mg via INTRAVENOUS

## 2024-05-28 MED ORDER — DROPERIDOL 2.5 MG/ML IJ SOLN
INTRAMUSCULAR | Status: AC
  Filled 2024-05-28: qty 2

## 2024-05-28 MED ORDER — ONDANSETRON HCL 4 MG/2ML IJ SOLN
INTRAMUSCULAR | Status: DC | PRN
  Administered 2024-05-28: 4 mg via INTRAVENOUS

## 2024-05-28 MED ORDER — SUGAMMADEX SODIUM 200 MG/2ML IV SOLN
INTRAVENOUS | Status: DC | PRN
Start: 2024-05-28 — End: 2024-05-28
  Administered 2024-05-28: 200 mg via INTRAVENOUS

## 2024-05-28 MED ORDER — PROPOFOL 1000 MG/100ML IV EMUL
INTRAVENOUS | Status: AC
  Filled 2024-05-28: qty 100

## 2024-05-28 MED ORDER — BUPIVACAINE HCL (PF) 0.25 % IJ SOLN
INTRAMUSCULAR | Status: AC
Start: 2024-05-28 — End: 2024-05-28
  Filled 2024-05-28: qty 30

## 2024-05-28 MED ORDER — ROCURONIUM BROMIDE 10 MG/ML (PF) SYRINGE
PREFILLED_SYRINGE | INTRAVENOUS | Status: AC
  Filled 2024-05-28: qty 10

## 2024-05-28 MED ORDER — LACTATED RINGERS IV SOLN: INTRAVENOUS | Status: DC

## 2024-05-28 MED ORDER — PHENYLEPHRINE 80 MCG/ML (10ML) SYRINGE FOR IV PUSH (FOR BLOOD PRESSURE SUPPORT)
PREFILLED_SYRINGE | INTRAVENOUS | Status: AC
  Filled 2024-05-28: qty 10

## 2024-05-28 MED ORDER — ONDANSETRON HCL 4 MG PO TABS: 4.0000 mg | ORAL_TABLET | Freq: Four times a day (QID) | ORAL | Status: DC | PRN

## 2024-05-28 MED ORDER — MIDAZOLAM HCL 2 MG/2ML IJ SOLN
INTRAMUSCULAR | Status: AC
  Filled 2024-05-28: qty 2

## 2024-05-28 MED ORDER — ONDANSETRON HCL 4 MG/2ML IJ SOLN
INTRAMUSCULAR | Status: AC
  Filled 2024-05-28: qty 2

## 2024-05-28 MED ORDER — PHENYLEPHRINE HCL-NACL 20-0.9 MG/250ML-% IV SOLN
INTRAVENOUS | Status: DC | PRN
  Administered 2024-05-28: 30 ug/min via INTRAVENOUS

## 2024-05-28 MED ORDER — LACTATED RINGERS IR SOLN
Status: DC | PRN
  Administered 2024-05-28: 1000 mL

## 2024-05-28 MED ORDER — DEXAMETHASONE SODIUM PHOSPHATE 10 MG/ML IJ SOLN
INTRAMUSCULAR | Status: AC
  Filled 2024-05-28: qty 1

## 2024-05-28 MED ORDER — HYDROMORPHONE HCL 1 MG/ML IJ SOLN
0.2500 mg | INTRAMUSCULAR | Status: DC | PRN
  Administered 2024-05-28 – 2024-05-29 (×4): 0.5 mg via INTRAVENOUS
  Filled 2024-05-28 (×4): qty 0.5

## 2024-05-28 MED ORDER — SUGAMMADEX SODIUM 200 MG/2ML IV SOLN
INTRAVENOUS | Status: AC
  Filled 2024-05-28: qty 2

## 2024-05-28 MED ORDER — FENTANYL CITRATE (PF) 100 MCG/2ML IJ SOLN
INTRAMUSCULAR | Status: DC | PRN
  Administered 2024-05-28: 100 ug via INTRAVENOUS
  Administered 2024-05-28: 75 ug via INTRAVENOUS
  Administered 2024-05-28: 25 ug via INTRAVENOUS

## 2024-05-28 MED ORDER — OXYCODONE HCL 5 MG PO TABS: 5.0000 mg | ORAL_TABLET | Freq: Once | ORAL | Status: DC | PRN

## 2024-05-28 MED ORDER — CHLORHEXIDINE GLUCONATE 0.12 % MT SOLN
15.0000 mL | Freq: Once | OROMUCOSAL | Status: AC
  Administered 2024-05-28: 15 mL via OROMUCOSAL

## 2024-05-28 MED ORDER — STERILE WATER FOR INJECTION IJ SOLN
INTRAMUSCULAR | Status: DC | PRN
  Administered 2024-05-28: 4 mL

## 2024-05-28 MED ORDER — ACETAMINOPHEN 500 MG PO TABS
1000.0000 mg | ORAL_TABLET | Freq: Four times a day (QID) | ORAL | Status: DC
  Administered 2024-05-28 – 2024-06-02 (×17): 1000 mg via ORAL
  Filled 2024-05-28 (×18): qty 2

## 2024-05-28 MED ORDER — ALBUMIN HUMAN 5 % IV SOLN
INTRAVENOUS | Status: AC
Start: 2024-05-28 — End: 2024-05-28
  Filled 2024-05-28: qty 250

## 2024-05-28 MED ORDER — EPHEDRINE 5 MG/ML INJ
INTRAVENOUS | Status: AC
  Filled 2024-05-28: qty 5

## 2024-05-28 MED ORDER — TRAMADOL HCL 50 MG PO TABS
50.0000 mg | ORAL_TABLET | Freq: Four times a day (QID) | ORAL | Status: DC | PRN
  Administered 2024-05-29 – 2024-05-30 (×2): 50 mg via ORAL
  Filled 2024-05-28 (×2): qty 1

## 2024-05-28 MED ORDER — STERILE WATER FOR IRRIGATION IR SOLN
Status: DC | PRN
  Administered 2024-05-28: 1000 mL

## 2024-05-28 MED ORDER — CEFAZOLIN SODIUM-DEXTROSE 2-4 GM/100ML-% IV SOLN
2.0000 g | INTRAVENOUS | Status: AC
  Administered 2024-05-28: 2 g via INTRAVENOUS
  Filled 2024-05-28: qty 100

## 2024-05-28 MED ORDER — ENOXAPARIN SODIUM 40 MG/0.4ML IJ SOSY
40.0000 mg | PREFILLED_SYRINGE | INTRAMUSCULAR | Status: DC
  Administered 2024-05-29 – 2024-06-01 (×4): 40 mg via SUBCUTANEOUS
  Filled 2024-05-28 (×4): qty 0.4

## 2024-05-28 MED ORDER — ACETAMINOPHEN 10 MG/ML IV SOLN: 1000.0000 mg | Freq: Once | INTRAVENOUS | Status: DC | PRN

## 2024-05-28 MED ORDER — ROCURONIUM BROMIDE 100 MG/10ML IV SOLN
INTRAVENOUS | Status: DC | PRN
  Administered 2024-05-28: 70 mg via INTRAVENOUS

## 2024-05-28 MED ORDER — PROPOFOL 10 MG/ML IV BOLUS
INTRAVENOUS | Status: DC | PRN
  Administered 2024-05-28: 100 ug/kg/min via INTRAVENOUS
  Administered 2024-05-28: 150 mg via INTRAVENOUS

## 2024-05-28 MED ORDER — ONDANSETRON HCL 4 MG/2ML IJ SOLN
4.0000 mg | Freq: Four times a day (QID) | INTRAMUSCULAR | Status: DC | PRN
  Administered 2024-05-28 – 2024-05-30 (×5): 4 mg via INTRAVENOUS
  Filled 2024-05-28 (×5): qty 2

## 2024-05-28 MED ORDER — OXYCODONE HCL 5 MG/5ML PO SOLN: 5.0000 mg | Freq: Once | ORAL | Status: DC | PRN

## 2024-05-28 MED ORDER — STERILE WATER FOR INJECTION IJ SOLN
INTRAMUSCULAR | Status: DC | PRN
  Administered 2024-05-28: 10 mL

## 2024-05-28 MED ORDER — BUPIVACAINE HCL 0.25 % IJ SOLN
INTRAMUSCULAR | Status: DC | PRN
  Administered 2024-05-28: 30 mL

## 2024-05-28 MED ORDER — DEXMEDETOMIDINE HCL IN NACL 80 MCG/20ML IV SOLN
INTRAVENOUS | Status: DC | PRN
  Administered 2024-05-28: 12 ug via INTRAVENOUS

## 2024-05-28 MED ORDER — MIDAZOLAM HCL 5 MG/5ML IJ SOLN
INTRAMUSCULAR | Status: DC | PRN
  Administered 2024-05-28: 2 mg via INTRAVENOUS

## 2024-05-28 MED ORDER — OXYCODONE HCL 5 MG PO TABS
5.0000 mg | ORAL_TABLET | ORAL | Status: DC | PRN
  Administered 2024-05-28 – 2024-05-30 (×6): 10 mg via ORAL
  Filled 2024-05-28 (×6): qty 2

## 2024-05-28 MED ORDER — PROPOFOL 10 MG/ML IV BOLUS
INTRAVENOUS | Status: AC
  Filled 2024-05-28: qty 20

## 2024-05-28 MED ORDER — DIPHENHYDRAMINE HCL 50 MG/ML IJ SOLN
INTRAMUSCULAR | Status: AC
  Filled 2024-05-28: qty 1

## 2024-05-28 MED ORDER — FENTANYL CITRATE PF 50 MCG/ML IJ SOSY
25.0000 ug | PREFILLED_SYRINGE | INTRAMUSCULAR | Status: DC | PRN
  Administered 2024-05-28: 25 ug via INTRAVENOUS
  Administered 2024-05-28: 50 ug via INTRAVENOUS
  Administered 2024-05-28: 25 ug via INTRAVENOUS

## 2024-05-28 MED ORDER — ORAL CARE MOUTH RINSE: 15.0000 mL | Freq: Once | OROMUCOSAL | Status: AC

## 2024-05-28 MED ORDER — FENTANYL CITRATE (PF) 100 MCG/2ML IJ SOLN
INTRAMUSCULAR | Status: AC
  Filled 2024-05-28: qty 2

## 2024-05-28 MED ORDER — LIDOCAINE HCL (PF) 2 % IJ SOLN
INTRAMUSCULAR | Status: AC
  Filled 2024-05-28: qty 5

## 2024-05-28 MED ORDER — SENNOSIDES-DOCUSATE SODIUM 8.6-50 MG PO TABS
2.0000 | ORAL_TABLET | Freq: Every day | ORAL | Status: DC
  Administered 2024-05-28 – 2024-06-01 (×4): 2 via ORAL
  Filled 2024-05-28 (×5): qty 2

## 2024-05-28 MED ORDER — FENTANYL CITRATE PF 50 MCG/ML IJ SOSY
PREFILLED_SYRINGE | INTRAMUSCULAR | Status: AC
  Filled 2024-05-28: qty 1

## 2024-05-28 MED ORDER — HEPARIN SODIUM (PORCINE) 5000 UNIT/ML IJ SOLN
5000.0000 [IU] | INTRAMUSCULAR | Status: AC
  Administered 2024-05-28: 5000 [IU] via SUBCUTANEOUS
  Filled 2024-05-28: qty 1

## 2024-05-28 MED ORDER — ACETAMINOPHEN 500 MG PO TABS
1000.0000 mg | ORAL_TABLET | ORAL | Status: AC
  Administered 2024-05-28: 1000 mg via ORAL
  Filled 2024-05-28: qty 2

## 2024-05-28 SURGICAL SUPPLY — 69 items
APPLICATOR SURGIFLO ENDO (HEMOSTASIS) IMPLANT
BAG COUNTER SPONGE SURGICOUNT (BAG) IMPLANT
BAG LAPAROSCOPIC 12 15 PORT 16 (BASKET) IMPLANT
BLADE SURG SZ10 CARB STEEL (BLADE) IMPLANT
COVER BACK TABLE 60X90IN (DRAPES) ×2 IMPLANT
COVER TIP SHEARS 8 DVNC (MISCELLANEOUS) ×2 IMPLANT
DERMABOND ADVANCED .7 DNX12 (GAUZE/BANDAGES/DRESSINGS) ×2 IMPLANT
DRAPE ARM DVNC X/XI (DISPOSABLE) ×8 IMPLANT
DRAPE COLUMN DVNC XI (DISPOSABLE) ×2 IMPLANT
DRAPE SHEET LG 3/4 BI-LAMINATE (DRAPES) ×2 IMPLANT
DRAPE SURG IRRIG POUCH 19X23 (DRAPES) ×2 IMPLANT
DRIVER NDL MEGA SUTCUT DVNCXI (INSTRUMENTS) ×2 IMPLANT
DRIVER NDLE MEGA SUTCUT DVNCXI (INSTRUMENTS) ×2 IMPLANT
DRSG OPSITE POSTOP 4X6 (GAUZE/BANDAGES/DRESSINGS) IMPLANT
DRSG OPSITE POSTOP 4X8 (GAUZE/BANDAGES/DRESSINGS) IMPLANT
ELECT PENCIL ROCKER SW 15FT (MISCELLANEOUS) IMPLANT
ELECT REM PT RETURN 15FT ADLT (MISCELLANEOUS) ×2 IMPLANT
FORCEPS BPLR FENES DVNC XI (FORCEP) ×2 IMPLANT
FORCEPS PROGRASP DVNC XI (FORCEP) ×2 IMPLANT
GAUZE 4X4 16PLY ~~LOC~~+RFID DBL (SPONGE) ×4 IMPLANT
GLOVE BIO SURGEON STRL SZ 6 (GLOVE) ×8 IMPLANT
GLOVE BIO SURGEON STRL SZ 6.5 (GLOVE) ×2 IMPLANT
GOWN STRL REUS W/ TWL LRG LVL3 (GOWN DISPOSABLE) ×8 IMPLANT
GRASPER SUT TROCAR 14GX15 (MISCELLANEOUS) IMPLANT
HOLDER FOLEY CATH W/STRAP (MISCELLANEOUS) IMPLANT
IRRIGATION SUCT STRKRFLW 2 WTP (MISCELLANEOUS) ×2 IMPLANT
KIT PROCEDURE DVNC SI (MISCELLANEOUS) IMPLANT
KIT TURNOVER KIT A (KITS) ×2 IMPLANT
LIGASURE IMPACT 36 18CM CVD LR (INSTRUMENTS) IMPLANT
MANIPULATOR ADVINCU DEL 3.0 PL (MISCELLANEOUS) IMPLANT
MANIPULATOR ADVINCU DEL 3.5 PL (MISCELLANEOUS) IMPLANT
MANIPULATOR UTERINE 4.5 ZUMI (MISCELLANEOUS) IMPLANT
NDL HYPO 21X1.5 SAFETY (NEEDLE) ×2 IMPLANT
NDL SPNL 18GX3.5 QUINCKE PK (NEEDLE) IMPLANT
NEEDLE HYPO 21X1.5 SAFETY (NEEDLE) ×2 IMPLANT
NEEDLE SPNL 18GX3.5 QUINCKE PK (NEEDLE) ×2 IMPLANT
OBTURATOR OPTICALSTD 8 DVNC (TROCAR) ×2 IMPLANT
PACK ROBOT GYN CUSTOM WL (TRAY / TRAY PROCEDURE) ×2 IMPLANT
PAD POSITIONING PINK XL (MISCELLANEOUS) ×2 IMPLANT
PORT ACCESS TROCAR AIRSEAL 12 (TROCAR) IMPLANT
SCISSORS LAP 5X45 EPIX DISP (ENDOMECHANICALS) IMPLANT
SCISSORS MNPLR CVD DVNC XI (INSTRUMENTS) ×2 IMPLANT
SCRUB CHG 4% DYNA-HEX 4OZ (MISCELLANEOUS) IMPLANT
SEAL UNIV 5-12 XI (MISCELLANEOUS) ×8 IMPLANT
SET TRI-LUMEN FLTR TB AIRSEAL (TUBING) ×2 IMPLANT
SPIKE FLUID TRANSFER (MISCELLANEOUS) ×2 IMPLANT
SPONGE T-LAP 18X18 ~~LOC~~+RFID (SPONGE) IMPLANT
SURGIFLO W/THROMBIN 8M KIT (HEMOSTASIS) IMPLANT
SUT MNCRL AB 4-0 PS2 18 (SUTURE) IMPLANT
SUT PDS AB 1 TP1 96 (SUTURE) IMPLANT
SUT STRATA PDS 0 30 CT-2.5 (SUTURE) IMPLANT
SUT STRATAFIX PDS+0 CT1 9 (SUTURE) IMPLANT
SUT V-LOC 180 0-0 GS22 (SUTURE) IMPLANT
SUT VIC AB 0 CT1 27XBRD ANTBC (SUTURE) IMPLANT
SUT VIC AB 2-0 CT1 TAPERPNT 27 (SUTURE) IMPLANT
SUT VIC AB 2-0 SH 27X BRD (SUTURE) IMPLANT
SUT VIC AB 4-0 PS2 18 (SUTURE) ×4 IMPLANT
SUT VICRYL 0 27 CT2 27 ABS (SUTURE) ×2 IMPLANT
SUT VLOC 180 0 9IN GS21 (SUTURE) IMPLANT
SYR 10ML LL (SYRINGE) IMPLANT
SYSTEM BAG RETRIEVAL 10MM (BASKET) IMPLANT
SYSTEM WOUND ALEXIS 18CM MED (MISCELLANEOUS) IMPLANT
TRAP SPECIMEN MUCUS 40CC (MISCELLANEOUS) IMPLANT
TRAY FOLEY MTR SLVR 16FR STAT (SET/KITS/TRAYS/PACK) ×2 IMPLANT
TROCAR PORT AIRSEAL 5X120 (TROCAR) IMPLANT
TROCAR XCEL NON-BLD 5MMX100MML (ENDOMECHANICALS) ×2 IMPLANT
UNDERPAD 30X36 HEAVY ABSORB (UNDERPADS AND DIAPERS) ×4 IMPLANT
WATER STERILE IRR 1000ML POUR (IV SOLUTION) ×2 IMPLANT
YANKAUER SUCT BULB TIP 10FT TU (MISCELLANEOUS) IMPLANT

## 2024-05-28 NOTE — Interval H&P Note (Signed)
 History and Physical Interval Note:  05/28/2024 1:11 PM  Destiny Henderson  has presented today for surgery, with the diagnosis of Endometrial intraepithelial neoplasia.  The various methods of treatment have been discussed with the patient and family. After consideration of risks, benefits and other options for treatment, the patient has consented to  Procedure(s) with comments: HYSTERECTOMY, TOTAL, ROBOT-ASSISTED, LAPAROSCOPIC, WITH BILATERAL SALPINGO-OOPHORECTOMY (Bilateral) - POSSIBLE LAPAROTOMY INJECTION, FOR SENTINEL LYMPH NODE IDENTIFICATION (N/A) LYMPH NODE BIOPSY (N/A) as a surgical intervention.  The patient's history has been reviewed, patient examined, no change in status, stable for surgery.  I have reviewed the patient's chart and labs.  Questions were answered to the patient's satisfaction.     Comer JONELLE Dollar

## 2024-05-28 NOTE — Transfer of Care (Signed)
 Immediate Anesthesia Transfer of Care Note  Patient: Destiny Henderson  Procedure(s) Performed: HYSTERECTOMY, TOTAL, ROBOT-ASSISTED, LAPAROSCOPIC, WITH LEFT SALPINGO-OOPHORECTOMY (Left) ICG INJECTION  Patient Location: PACU  Anesthesia Type:General  Level of Consciousness: drowsy, patient cooperative, and responds to stimulation  Airway & Oxygen Therapy: Patient Spontanous Breathing and Patient connected to face mask oxygen  Post-op Assessment: Report given to RN and Post -op Vital signs reviewed and stable  Post vital signs: Reviewed and stable  Last Vitals:  Vitals Value Taken Time  BP 117/67 05/28/24 16:04  Temp    Pulse 64 05/28/24 16:07  Resp 20 05/28/24 16:07  SpO2 100 % 05/28/24 16:07  Vitals shown include unfiled device data.  Last Pain:  Vitals:   05/28/24 1012  TempSrc: Oral  PainSc:          Complications: No notable events documented.

## 2024-05-28 NOTE — Anesthesia Procedure Notes (Signed)
 Procedure Name: Intubation Date/Time: 05/28/2024 2:31 PM  Performed by: Nada Corean CROME, CRNAPre-anesthesia Checklist: Emergency Drugs available, Patient identified, Suction available, Patient being monitored and Timeout performed Patient Re-evaluated:Patient Re-evaluated prior to induction Oxygen Delivery Method: Circle system utilized Preoxygenation: Pre-oxygenation with 100% oxygen Induction Type: IV induction Ventilation: Mask ventilation without difficulty Laryngoscope Size: Miller and 4 Grade View: Grade I Tube type: Oral Tube size: 7.0 mm Number of attempts: 1 Airway Equipment and Method: Stylet Placement Confirmation: ETT inserted through vocal cords under direct vision, positive ETCO2 and breath sounds checked- equal and bilateral Secured at: 20 cm Tube secured with: Tape Dental Injury: Teeth and Oropharynx as per pre-operative assessment

## 2024-05-28 NOTE — Anesthesia Postprocedure Evaluation (Signed)
 Anesthesia Post Note  Patient: Destiny Henderson  Procedure(s) Performed: HYSTERECTOMY, TOTAL, ROBOT-ASSISTED, LAPAROSCOPIC, WITH LEFT SALPINGO-OOPHORECTOMY (Left) ICG INJECTION     Patient location during evaluation: PACU Anesthesia Type: General Level of consciousness: awake and alert Pain management: pain level controlled Vital Signs Assessment: post-procedure vital signs reviewed and stable Respiratory status: spontaneous breathing, nonlabored ventilation, respiratory function stable and patient connected to nasal cannula oxygen Cardiovascular status: blood pressure returned to baseline and stable Postop Assessment: no apparent nausea or vomiting Anesthetic complications: no   No notable events documented.  Last Vitals:  Vitals:   05/28/24 1700 05/28/24 1715  BP: (!) 104/91 114/68  Pulse: 62 62  Resp: 16 12  Temp:    SpO2: 98% 99%    Last Pain:  Vitals:   05/28/24 1707  TempSrc:   PainSc: 6                  Zak Gondek L Olivianna Higley

## 2024-05-28 NOTE — Op Note (Signed)
 OPERATIVE NOTE  Pre-operative Diagnosis: EIN  Post-operative Diagnosis: same  Operation: Robotic-assisted laparoscopic total hysterectomy with left salpingo-oophorectomy, SLN mapping  Surgeon: Viktoria Crank MD  Assistant Surgeon: Eleanor Epps, NP (an NP assistant was necessary for tissue manipulation, management of robotic instrumentation, retraction and positioning due to the complexity of the case and hospital policies).   Anesthesia: GET  Urine Output: 50 cc  Operative Findings: On EUA, small mobile uterus. On intra-abdominal entry, minimal adhesions between right liver and anterior abdominal wall. Normal appearing stomach, small and large bowel. Uterus 8 cm and normal in appearance. Surgically absent right tube and ovary. Normal left tube and ovary. Mapping successful to bilateral obturator SLNs. On gross examination by pathology, no significant endometrial lesion noted, no evidence of invasive disease.  Estimated Blood Loss:  50 cc      Total IV Fluids: see I&O flowsheet         Specimens: uterus, cervix, left tube and ovary         Complications:  None apparent; patient tolerated the procedure well.         Disposition: PACU - hemodynamically stable.  Procedure Details  The patient was seen in the Holding Room. The risks, benefits, complications, treatment options, and expected outcomes were discussed with the patient.  The patient concurred with the proposed plan, giving informed consent.  The site of surgery properly noted/marked. The patient was identified as Destiny Henderson and the procedure verified as a Robotic-assisted hysterectomy with bilateral salpingo oophorectomy with SLN mapping and possible biopsy.   After induction of anesthesia, the patient was draped and prepped in the usual sterile manner. Patient was placed in supine position after anesthesia and draped and prepped in the usual sterile manner as follows: Her arms were tucked to her side with all appropriate  precautions.  The patient was secured to the bed using padding and tape across her chest.  The patient was placed in the semi-lithotomy position in Clyde stirrups.  The perineum and vagina were prepped with CHG. The patient's abdomen was prepped with ChloraPrep and she was draped after the prep had been allowed to dry for 3 minutes.  A Time Out was held and the above information confirmed.  The urethra was prepped with Betadine. Foley catheter was placed.  A sterile speculum was placed in the vagina.  The cervix was grasped with a single-tooth tenaculum. 2mg  total of ICG was injected into the cervical stroma at 2 and 9 o'clock with 1cc injected at a 1cm and 2mm depth (concentration 0.5mg /ml) in all locations. The cervix was dilated with Fredirick dilators.  The 3.5 Advincula uterine manipulator with colpotomizer ring was placed without difficulty.  A pneum occluder balloon was placed over the manipulator.  OG tube placement was confirmed and to suction.   Next, a 10 mm skin incision was made 1 cm below the subcostal margin in the midclavicular line.  The 5 mm Optiview port and scope was used for direct entry.  Opening pressure was under 10 mm CO2.  The abdomen was insufflated and the findings were noted as above.   At this point and all points during the procedure, the patient's intra-abdominal pressure did not exceed 15 mmHg. Next, an 8 mm skin incision was made superior to the umbilicus and a right and left port were placed about 8 cm lateral to the robot port on the right and left side.  A fourth arm was placed on the right.  The 5 mm assist trocar  was exchanged for a 12 mm airseal port. All ports were placed under direct visualization.  The patient was placed in steep Trendelenburg.  Bowel was already in the upper abdomen.  The robot was docked in the normal manner.  The right and left peritoneum were opened parallel to the IP ligament to open the retroperitoneal spaces bilaterally. The round ligaments were  transected. The SLN mapping was performed in bilateral pelvic basins. After identifying the ureters, the para rectal and paravesical spaces were opened up entirely with careful dissection below the level of the ureters bilaterally and to the depth of the uterine artery origin in order to skeletonize the uterine web and ensure visualization of all parametrial channels. The para-aortic basins were carefully exposed and evaluated for isolated para-aortic SLN's. Lymphatic channels were identified travelling to the following visualized sentinel lymph node's: bilateral obturator sentinel lymph nodes. These SLNs were marked but not removed.  The hysterectomy was started.  The ureter was again noted to be on the medial leaf of the broad ligament.  The peritoneum above the ureter was incised and stretched and the infundibulopelvic ligament was skeletonized, cauterized and cut.  Despite prior right salpingo-oophorectomy, the IP ligament was intact and taken in the same manner. The posterior peritoneum was taken down to the level of the KOH ring.  The anterior peritoneum was also taken down.  The bladder flap was created to the level of the KOH ring.  The uterine artery on the right side was skeletonized, cauterized and cut in the normal manner.  A similar procedure was performed on the left.  The colpotomy was made and the uterus, cervix, bilateral ovaries and tubes were amputated and delivered through the vagina.  Pedicles were inspected and excellent hemostasis was achieved.    The colpotomy at the vaginal cuff was closed with 0 Vicryl with a figure of eight stitch at each apex and 0 Stratafix to close the midportion of the cuff in two layers in a running manner.  Irrigation was used and excellent hemostasis was achieved. Intra-abdominal pressure was decreased to 5 mm Hg with excellent hemostasis maintained. At this point in the procedure was completed.  Robotic instruments were removed under direct visulaization.   The robot was undocked. The fascia at the 12 mm port was closed with 0 Vicryl using a PMI fascial closure device under direct visualization.  The subcuticular tissue was closed with 4-0 Vicryl and the skin was closed with 4-0 Monocryl in a subcuticular manner.  Dermabond was applied.    The vagina was swabbed with minimal bleeding noted. Foley catheter was removed  All sponge, lap and needle counts were correct x  3.   The patient was transferred to the recovery room in stable condition.  [name], MD

## 2024-05-29 ENCOUNTER — Telehealth: Payer: Self-pay

## 2024-05-29 ENCOUNTER — Encounter (HOSPITAL_COMMUNITY): Payer: Self-pay | Admitting: Gynecologic Oncology

## 2024-05-29 ENCOUNTER — Telehealth: Payer: Self-pay | Admitting: Oncology

## 2024-05-29 DIAGNOSIS — N8502 Endometrial intraepithelial neoplasia [EIN]: Secondary | ICD-10-CM | POA: Diagnosis not present

## 2024-05-29 LAB — BASIC METABOLIC PANEL WITH GFR
Anion gap: 8 (ref 5–15)
Anion gap: 12 (ref 5–15)
Anion gap: 6 (ref 5–15)
BUN: 13 mg/dL (ref 8–23)
BUN: 15 mg/dL (ref 8–23)
BUN: 15 mg/dL (ref 8–23)
CO2: 22 mmol/L (ref 22–32)
CO2: 24 mmol/L (ref 22–32)
CO2: 26 mmol/L (ref 22–32)
Calcium: 8.5 mg/dL — ABNORMAL LOW (ref 8.9–10.3)
Calcium: 9 mg/dL (ref 8.9–10.3)
Calcium: 8.4 mg/dL — ABNORMAL LOW (ref 8.9–10.3)
Chloride: 109 mmol/L (ref 98–111)
Chloride: 104 mmol/L (ref 98–111)
Chloride: 109 mmol/L (ref 98–111)
Creatinine, Ser: 1.14 mg/dL — ABNORMAL HIGH (ref 0.44–1.00)
Creatinine, Ser: 1.23 mg/dL — ABNORMAL HIGH (ref 0.44–1.00)
Creatinine, Ser: 1.21 mg/dL — ABNORMAL HIGH (ref 0.44–1.00)
GFR, Estimated: 55 mL/min — ABNORMAL LOW (ref >60)
GFR, Estimated: 50 mL/min — ABNORMAL LOW (ref >60)
GFR, Estimated: 51 mL/min — ABNORMAL LOW (ref >60)
Glucose, Bld: 79 mg/dL (ref 70–99)
Glucose, Bld: 101 mg/dL — ABNORMAL HIGH (ref 70–99)
Glucose, Bld: 107 mg/dL — ABNORMAL HIGH (ref 70–99)
Potassium: 4.3 mmol/L (ref 3.5–5.1)
Potassium: 3.8 mmol/L (ref 3.5–5.1)
Potassium: 4.1 mmol/L (ref 3.5–5.1)
Sodium: 139 mmol/L (ref 135–145)
Sodium: 140 mmol/L (ref 135–145)
Sodium: 141 mmol/L (ref 135–145)

## 2024-05-29 LAB — CBC
HCT: 37 % (ref 36.0–46.0)
Hemoglobin: 11.1 g/dL — ABNORMAL LOW (ref 12.0–15.0)
MCH: 28.2 pg (ref 26.0–34.0)
MCHC: 30 g/dL (ref 30.0–36.0)
MCV: 94.1 fL (ref 80.0–100.0)
Platelets: 306 K/uL (ref 150–400)
RBC: 3.93 MIL/uL (ref 3.87–5.11)
RDW: 15.3 % (ref 11.5–15.5)
WBC: 10.5 K/uL (ref 4.0–10.5)
nRBC: 0 % (ref 0.0–0.2)

## 2024-05-29 LAB — GLUCOSE, CAPILLARY: Glucose-Capillary: 84 mg/dL (ref 70–99)

## 2024-05-29 MED ORDER — PROCHLORPERAZINE MALEATE 5 MG PO TABS
5.0000 mg | ORAL_TABLET | Freq: Four times a day (QID) | ORAL | Status: DC | PRN
  Administered 2024-05-31: 5 mg via ORAL
  Filled 2024-05-29 (×2): qty 1

## 2024-05-29 MED ORDER — ALBUTEROL SULFATE HFA 108 (90 BASE) MCG/ACT IN AERS: 2.0000 | INHALATION_SPRAY | Freq: Four times a day (QID) | RESPIRATORY_TRACT | Status: DC | PRN

## 2024-05-29 MED ORDER — METOPROLOL SUCCINATE ER 50 MG PO TB24
50.0000 mg | ORAL_TABLET | Freq: Every day | ORAL | Status: DC
Start: 2024-05-29 — End: 2024-06-02
  Administered 2024-05-29 – 2024-06-02 (×5): 50 mg via ORAL
  Filled 2024-05-29 (×6): qty 1

## 2024-05-29 MED ORDER — HYDROXYZINE PAMOATE 25 MG PO CAPS: 25.0000 mg | ORAL_CAPSULE | Freq: Three times a day (TID) | ORAL | Status: DC | PRN

## 2024-05-29 MED ORDER — ALBUTEROL SULFATE (2.5 MG/3ML) 0.083% IN NEBU
2.5000 mg | INHALATION_SOLUTION | Freq: Four times a day (QID) | RESPIRATORY_TRACT | Status: DC | PRN
  Administered 2024-05-30 – 2024-05-31 (×2): 2.5 mg via RESPIRATORY_TRACT
  Filled 2024-05-29 (×2): qty 3

## 2024-05-29 MED ORDER — TRAZODONE HCL 100 MG PO TABS
100.0000 mg | ORAL_TABLET | Freq: Every evening | ORAL | Status: DC | PRN
Start: 2024-05-29 — End: 2024-06-02

## 2024-05-29 MED ORDER — METHOCARBAMOL 500 MG PO TABS
500.0000 mg | ORAL_TABLET | Freq: Three times a day (TID) | ORAL | Status: DC
  Administered 2024-05-29 – 2024-06-02 (×13): 500 mg via ORAL
  Filled 2024-05-29 (×13): qty 1

## 2024-05-29 MED ORDER — PREGABALIN 25 MG PO CAPS
50.0000 mg | ORAL_CAPSULE | Freq: Two times a day (BID) | ORAL | Status: DC
  Administered 2024-05-29 – 2024-06-02 (×8): 50 mg via ORAL
  Filled 2024-05-29 (×8): qty 2

## 2024-05-29 MED ORDER — TOPIRAMATE 100 MG PO TABS
100.0000 mg | ORAL_TABLET | Freq: Two times a day (BID) | ORAL | Status: DC
  Administered 2024-05-29 – 2024-06-02 (×8): 100 mg via ORAL
  Filled 2024-05-29 (×8): qty 1

## 2024-05-29 MED ORDER — DULOXETINE HCL 30 MG PO CPEP
30.0000 mg | ORAL_CAPSULE | Freq: Every day | ORAL | Status: DC | PRN
  Administered 2024-05-31 – 2024-06-02 (×2): 30 mg via ORAL
  Filled 2024-05-29 (×2): qty 1

## 2024-05-29 MED ORDER — SODIUM CHLORIDE 0.9 % IV BOLUS
500.0000 mL | Freq: Once | INTRAVENOUS | Status: AC
  Administered 2024-05-29: 500 mL via INTRAVENOUS

## 2024-05-29 MED ORDER — LINACLOTIDE 145 MCG PO CAPS
290.0000 ug | ORAL_CAPSULE | Freq: Every day | ORAL | Status: DC
  Administered 2024-05-29 – 2024-06-02 (×5): 290 ug via ORAL
  Filled 2024-05-29 (×5): qty 2

## 2024-05-29 MED ORDER — RIZATRIPTAN BENZOATE 5 MG PO TBDP
5.0000 mg | ORAL_TABLET | Freq: Every day | ORAL | Status: DC | PRN
Start: 2024-05-29 — End: 2024-05-29

## 2024-05-29 NOTE — Progress Notes (Signed)
 1 Day Post-Op Procedure(s) (LRB): HYSTERECTOMY, TOTAL, ROBOT-ASSISTED, LAPAROSCOPIC, WITH LEFT SALPINGO-OOPHORECTOMY (Left) ICG INJECTION (N/A)  Subjective: Patient reports feeling terrible. Having moderate abdominal soreness. Has had mild nausea relieved with zofran . Pain medication is helping relieve her symptoms. Has starting to eat some fruit and tolerate this. Had mild dizziness when getting out of bed for the first time. Has been voiding without difficulty. Has some mild burning after recent foley in OR. Denies chest pain, dyspnea.  Objective: Vital signs in last 24 hours: Temp:  [97.6 F (36.4 C)-99.6 F (37.6 C)] 99.1 F (37.3 C) (08/22 0534) Pulse Rate:  [57-77] 70 (08/22 0534) Resp:  [11-25] 16 (08/22 0534) BP: (98-153)/(59-91) 140/73 (08/22 0534) SpO2:  [91 %-100 %] 100 % (08/22 0534) Weight:  [216 lb 0.8 oz (98 kg)] 216 lb 0.8 oz (98 kg) (08/21 1002)    Intake/Output from previous day: 08/21 0701 - 08/22 0700 In: 1763.9 [P.O.:540; I.V.:1123.9; IV Piggyback:100] Out: 550 [Urine:500; Blood:50]  Physical Examination: General: alert, cooperative, and no distress Resp: clear to auscultation bilaterally Cardio: regular rate and rhythm, S1, S2 normal, no murmur, click, rub or gallop GI: incision: lap sites incisions to the abdomen intact with dermabond without active drainage and abdomen is soft, active bowel sounds, appropriately tender Extremities: extremities normal, atraumatic, no cyanosis or edema  Labs: WBC/Hgb/Hct/Plts:  10.5/11.1/37.0/306 (08/22 0443) BUN/Cr/glu/ALT/AST/amyl/lip:  13/1.14/--/--/--/--/-- (08/22 9556)  Assessment: 63 y.o. s/p Procedure(s): HYSTERECTOMY, TOTAL, ROBOT-ASSISTED, LAPAROSCOPIC, WITH LEFT SALPINGO-OOPHORECTOMY ICG INJECTION: stable Pain:  Pain is well-controlled on PRN medications.  Heme: Hgb 11.1 and Hct 37.0 this am. Appropriate given preop values and recent surgery.  ID: WBC 10.5. Given decadron  and intra-op antibiotics. Afebrile  with no signs of infection.  CV: BP and HR overall stable. Continue to monitor while inpatient.  GI:  Tolerating po: yes, improving. Antiemetics ordered prn.  GU: Voiding without difficulty. Creatinine up to 1.14 this am. Will repeat later this am.     FEN: No critical values on am labs.  Prophylaxis: SCD and lovenox  ordered.  Plan: Adding robaxin  for post-op pain Repeat Bmet later this am to re-evaluate creatinine level If meeting milestones later today, plan for discharge after this time.   LOS: 0 days    Destiny Henderson 05/29/2024, 8:00 AM

## 2024-05-29 NOTE — Discharge Instructions (Signed)
 AFTER SURGERY INSTRUCTIONS   Return to work: 4-6 weeks if applicable   Activity: 1. Be up and out of the bed during the day.  Take a nap if needed.  You may walk up steps but be careful and use the hand rail.  Stair climbing will tire you more than you think, you may need to stop part way and rest.    2. No lifting or straining for 6 weeks over 10 pounds. No pushing, pulling, straining for 6 weeks.   3. No driving for 1-61 days when the following criteria have been met: Do not drive if you are taking narcotic pain medicine and make sure that your reaction time has returned.    4. You can shower as soon as the next day after surgery. Shower daily.  Use your regular soap and water  (not directly on the incision) and pat your incision(s) dry afterwards; don't rub.  No tub baths or submerging your body in water  until cleared by your surgeon. If you have the soap that was given to you by pre-surgical testing that was used before surgery, you do not need to use it afterwards because this can irritate your incisions.    5. No sexual activity and nothing in the vagina for 12 weeks.   6. You may experience a small amount of clear drainage from your incisions, which is normal.  If the drainage persists, increases, or changes color please call the office.   7. Do not use creams, lotions, or ointments such as neosporin on your incisions after surgery until advised by your surgeon because they can cause removal of the dermabond glue on your incisions.     8. You may experience vaginal spotting after surgery or when the stitches at the top of the vagina begin to dissolve.  The spotting is normal but if you experience heavy bleeding, call our office.   9. Take Tylenol or ibuprofen first for pain if you are able to take these medications and only use narcotic pain medication for severe pain not relieved by the Tylenol or Ibuprofen.  Monitor your Tylenol intake to a max of 4,000 mg in a 24 hour period. You can  alternate these medications after surgery.   Diet: 1. Low sodium Heart Healthy Diet is recommended but you are cleared to resume your normal (before surgery) diet after your procedure.   2. It is safe to use a laxative, such as Miralax or Colace, if you have difficulty moving your bowels before surgery. You have been prescribed Sennakot-S to take at bedtime every evening after surgery to keep bowel movements regular and to prevent constipation.     Wound Care: 1. Keep clean and dry.  Shower daily.   Reasons to call the Doctor: Fever - Oral temperature greater than 100.4 degrees Fahrenheit Foul-smelling vaginal discharge Difficulty urinating Nausea and vomiting Increased pain at the site of the incision that is unrelieved with pain medicine. Difficulty breathing with or without chest pain New calf pain especially if only on one side Sudden, continuing increased vaginal bleeding with or without clots.   Contacts: For questions or concerns you should contact:   Dr. Wiley Hanger at [phone]   Vira Grieves, NP at [phone]   After Hours: call [phone] and have the GYN Oncologist paged/contacted (after 5 pm or on the weekends). You will speak with an after hours RN and let he or she know you have had surgery.   Messages sent via mychart are for non-urgent  matters and are not responded to after hours so for urgent needs, please call the after hours number.

## 2024-05-29 NOTE — Progress Notes (Signed)
 Pt tried to eat her dinner which had some macaroni and cheese on it and had a vomiting episode Zofran  2 mg was given IV, ginger ale and some saltine crackers I asked pt to sip on the ginger ale she also have c/o swelling on left side of stomach where one of the incisions are I explained to reduce the heat she is putting on it and will be given a ice pack

## 2024-05-29 NOTE — Progress Notes (Signed)
 GYN ONC Progress Note  Patient is currently lying in bed, just came back from a walk in the hall with family.  Patient reports feeling terrible.  She states she continues to have nausea and had dizziness when ambulating.  Has been trying to drink more liquids.  Discussed repeat bmet with creatinine slightly higher.  500 cc normal saline bolus is finishing up at this time.  Given symptoms listed above with patient stating she cannot go home in this state, patient will be monitored for this afternoon and overnight to see how things are in the morning. Continue current plan of care. Am labs will be ordered.

## 2024-05-29 NOTE — Progress Notes (Signed)
   05/29/24 0954  TOC Brief Assessment  Insurance and Status Reviewed  Patient has primary care physician Yes  Home environment has been reviewed return home  Prior level of function: Independent  Prior/Current Home Services No current home services  Social Drivers of Health Review SDOH reviewed no interventions necessary  Readmission risk has been reviewed Yes  Transition of care needs no transition of care needs at this time

## 2024-05-29 NOTE — Telephone Encounter (Signed)
 FMLA forms received from [employer]. Requested information provided and faxed. A copy was also emailed to Ms.Grillo per her request

## 2024-05-30 DIAGNOSIS — N8502 Endometrial intraepithelial neoplasia [EIN]: Secondary | ICD-10-CM | POA: Diagnosis not present

## 2024-05-30 LAB — BASIC METABOLIC PANEL WITH GFR
Anion gap: 6 (ref 5–15)
BUN: 17 mg/dL (ref 8–23)
CO2: 26 mmol/L (ref 22–32)
Calcium: 8.5 mg/dL — ABNORMAL LOW (ref 8.9–10.3)
Chloride: 107 mmol/L (ref 98–111)
Creatinine, Ser: 1.26 mg/dL — ABNORMAL HIGH (ref 0.44–1.00)
GFR, Estimated: 49 mL/min — ABNORMAL LOW (ref >60)
Glucose, Bld: 106 mg/dL — ABNORMAL HIGH (ref 70–99)
Potassium: 4.2 mmol/L (ref 3.5–5.1)
Sodium: 139 mmol/L (ref 135–145)

## 2024-05-30 LAB — CBC
HCT: 35.9 % — ABNORMAL LOW (ref 36.0–46.0)
Hemoglobin: 10.7 g/dL — ABNORMAL LOW (ref 12.0–15.0)
MCH: 28.2 pg (ref 26.0–34.0)
MCHC: 29.8 g/dL — ABNORMAL LOW (ref 30.0–36.0)
MCV: 94.7 fL (ref 80.0–100.0)
Platelets: 303 K/uL (ref 150–400)
RBC: 3.79 MIL/uL — ABNORMAL LOW (ref 3.87–5.11)
RDW: 15.6 % — ABNORMAL HIGH (ref 11.5–15.5)
WBC: 7.5 K/uL (ref 4.0–10.5)
nRBC: 0 % (ref 0.0–0.2)

## 2024-05-30 MED ORDER — POLYETHYLENE GLYCOL 3350 17 G PO PACK
17.0000 g | PACK | Freq: Every day | ORAL | Status: DC
  Administered 2024-05-31 – 2024-06-02 (×2): 17 g via ORAL
  Filled 2024-05-30 (×4): qty 1

## 2024-05-30 MED ORDER — OXYCODONE HCL 5 MG PO TABS
5.0000 mg | ORAL_TABLET | ORAL | Status: DC | PRN
  Administered 2024-05-30 – 2024-06-02 (×11): 10 mg via ORAL
  Filled 2024-05-30 (×10): qty 2

## 2024-05-30 MED ORDER — TRAMADOL HCL 50 MG PO TABS: 50.0000 mg | ORAL_TABLET | Freq: Four times a day (QID) | ORAL | Status: DC | PRN

## 2024-05-30 MED ORDER — ONDANSETRON 4 MG PO TBDP
4.0000 mg | ORAL_TABLET | Freq: Four times a day (QID) | ORAL | Status: DC | PRN
  Administered 2024-05-30 – 2024-05-31 (×4): 4 mg via ORAL
  Filled 2024-05-30 (×5): qty 1

## 2024-05-30 MED ORDER — SODIUM CHLORIDE 0.9 % IV SOLN: 8.0000 mg | Freq: Four times a day (QID) | INTRAVENOUS | Status: DC | PRN

## 2024-05-30 NOTE — Plan of Care (Signed)

## 2024-05-30 NOTE — Progress Notes (Signed)
 2 Days Post-Op Procedure(s) (LRB): HYSTERECTOMY, TOTAL, ROBOT-ASSISTED, LAPAROSCOPIC, WITH LEFT SALPINGO-OOPHORECTOMY (Left) ICG INJECTION (N/A)  Subjective: Patient reports that she has continued experience some nausea and dizziness when walking.  Nausea medications works some.  Has not tried Compazine .  Feels that she is trying to drink as much she can.  Pain moderately controlled.  Voiding spontaneously.  Hoping to build to go home tomorrow.   Objective: Vital signs in last 24 hours: Temp:  [98.7 F (37.1 C)-99.1 F (37.3 C)] 98.7 F (37.1 C) (08/23 0604) Pulse Rate:  [55-74] 55 (08/23 0604) Resp:  [16-18] 18 (08/23 0604) BP: (110-150)/(62-102) 110/62 (08/23 0604) SpO2:  [94 %-100 %] 100 % (08/23 0604) Last BM Date : 05/28/24  Intake/Output from previous day: 08/22 0701 - 08/23 0700 In: 170 [P.O.:170] Out: 900 [Urine:900]  Physical Examination: General: alert, cooperative, and no distress Resp: clear to auscultation bilaterally Cardio: regular rate and rhythm, S1, S2 normal, no murmur, click, rub or gallop GI: incision: lap sites incisions to the abdomen intact with dermabond without active drainage and abdomen is soft, active bowel sounds, appropriately tender Extremities: extremities normal, atraumatic, no cyanosis or edema  Labs: WBC/Hgb/Hct/Plts:  7.5/10.7/35.9/303 (08/23 0526) BUN/Cr/glu/ALT/AST/amyl/lip:  17/1.26/--/--/--/--/-- (08/23 9473)  Assessment: 63 y.o. s/p Procedure(s): HYSTERECTOMY, TOTAL, ROBOT-ASSISTED, LAPAROSCOPIC, WITH LEFT SALPINGO-OOPHORECTOMY ICG INJECTION: stable Pain:  Pain is moderately well-controlled on PRN medications. Robaxin  added yesterday. Abdominal binder ordered  Heme: Hgb 10.7  this am. Appropriate given preop values and recent surgery.  ID: WBC 7.5. Given decadron  and intra-op antibiotics. Afebrile with no signs of infection.  CV: BP and HR overall stable. Continue to monitor while inpatient.  GI:  Tolerating po: yes, improving.  Antiemetics ordered prn.  GU: Voiding without difficulty. Creatinine 1.26, overall stable from yesterday but still mildly elevated from pt baseline. Encouraged PO intake and will recheck tomorrow.  FEN: No critical values on am labs.  Prophylaxis: SCD and lovenox  ordered.  Plan: Abdominal binder ordered Repeat Bmet tomorrow. Encourage PO ambulation. Continue to increase ambulation. Likely home tomorrow.   LOS: 0 days    Destiny Henderson 05/30/2024, 6:48 AM

## 2024-05-31 ENCOUNTER — Ambulatory Visit (HOSPITAL_COMMUNITY)

## 2024-05-31 DIAGNOSIS — N8502 Endometrial intraepithelial neoplasia [EIN]: Secondary | ICD-10-CM | POA: Diagnosis not present

## 2024-05-31 LAB — URINALYSIS, COMPLETE (UACMP) WITH MICROSCOPIC
Bacteria, UA: NONE SEEN
Bilirubin Urine: NEGATIVE
Glucose, UA: NEGATIVE mg/dL
Hgb urine dipstick: NEGATIVE
Ketones, ur: NEGATIVE mg/dL
Leukocytes,Ua: NEGATIVE
Nitrite: NEGATIVE
Protein, ur: NEGATIVE mg/dL
Specific Gravity, Urine: 1.02 (ref 1.005–1.030)
pH: 5 (ref 5.0–8.0)

## 2024-05-31 LAB — BASIC METABOLIC PANEL WITH GFR
Anion gap: 8 (ref 5–15)
BUN: 18 mg/dL (ref 8–23)
CO2: 25 mmol/L (ref 22–32)
Calcium: 8.4 mg/dL — ABNORMAL LOW (ref 8.9–10.3)
Chloride: 104 mmol/L (ref 98–111)
Creatinine, Ser: 1.15 mg/dL — ABNORMAL HIGH (ref 0.44–1.00)
GFR, Estimated: 54 mL/min — ABNORMAL LOW (ref >60)
Glucose, Bld: 103 mg/dL — ABNORMAL HIGH (ref 70–99)
Potassium: 4.1 mmol/L (ref 3.5–5.1)
Sodium: 137 mmol/L (ref 135–145)

## 2024-05-31 LAB — RESPIRATORY PANEL BY PCR

## 2024-05-31 LAB — CBC
HCT: 38 % (ref 36.0–46.0)
Hemoglobin: 11.3 g/dL — ABNORMAL LOW (ref 12.0–15.0)
MCH: 27.8 pg (ref 26.0–34.0)
MCHC: 29.7 g/dL — ABNORMAL LOW (ref 30.0–36.0)
MCV: 93.4 fL (ref 80.0–100.0)
Platelets: 328 K/uL (ref 150–400)
RBC: 4.07 MIL/uL (ref 3.87–5.11)
RDW: 15.5 % (ref 11.5–15.5)
WBC: 8.2 K/uL (ref 4.0–10.5)
nRBC: 0 % (ref 0.0–0.2)

## 2024-05-31 MED ORDER — BISACODYL 10 MG RE SUPP
10.0000 mg | Freq: Once | RECTAL | Status: AC
  Administered 2024-05-31: 10 mg via RECTAL
  Filled 2024-05-31: qty 1

## 2024-05-31 NOTE — Progress Notes (Signed)
 3 Days Post-Op Procedure(s) (LRB): HYSTERECTOMY, TOTAL, ROBOT-ASSISTED, LAPAROSCOPIC, WITH LEFT SALPINGO-OOPHORECTOMY (Left) ICG INJECTION (N/A)  Subjective: Patient reports still not feeling very well.  Has burning with urination.  Reports that she continues have occasional episodes of nausea with 1 episode of emesis overnight.  Also with some cough and nasal drip.  Required a breathing treatment last night for wheezing.  No bowel movement or flatus.  Ambulating without issue.   Objective: Vital signs in last 24 hours: Temp:  [97.8 F (36.6 C)-101.1 F (38.4 C)] 99.9 F (37.7 C) (08/24 1027) Pulse Rate:  [56-92] 92 (08/24 1027) Resp:  [16-18] 18 (08/24 1027) BP: (136-159)/(88-98) 136/98 (08/24 1027) SpO2:  [99 %-100 %] 100 % (08/24 1027) Last BM Date : 05/28/24  Intake/Output from previous day: 08/23 0701 - 08/24 0700 In: 1345 [P.O.:1345] Out: 300 [Urine:300]  Physical Examination: General: alert, cooperative, and no distress Resp: Expiratory wheezes particular in left greater than right.  IS to about 1000 mL Cardio: regular rate and rhythm GI: incision: lap sites incisions to the abdomen intact with dermabond without active drainage and abdomen is soft, active bowel sounds, not distended.  Appropriately tender Extremities: extremities normal, atraumatic, no cyanosis or edema  Labs:   BUN/Cr/glu/ALT/AST/amyl/lip:  18/1.15/--/--/--/--/-- (08/24 0449)  Assessment: 63 y.o. s/p Procedure(s): HYSTERECTOMY, TOTAL, ROBOT-ASSISTED, LAPAROSCOPIC, WITH LEFT SALPINGO-OOPHORECTOMY ICG INJECTION: stable Pain:  Pain is moderately well-controlled on PRN medications. Robaxin  added. Abdominal binder ordered  Heme: Hgb stable.  No concerns for active bleeding.  ID: Febrile overnight but was not notified.  CBC now ordered and pending.  Will follow-up.  Given various symptoms, we will order urinalysis, urine culture, chest x-ray, respiratory viral panel.  CV: BP and HR overall stable.  Continue to monitor while inpatient.  GI:  Tolerating po: yes, improving. Antiemetics ordered prn.  Abdominal x-ray given another episode of emesis although bowel sounds very normal today on exam.  GU: Dysuria.  Voiding spontaneously.  Creatinine improved to 1.15 today. Encouraged PO intake and will recheck tomorrow.  FEN: No critical values on am labs.  Prophylaxis: SCD and lovenox  ordered.  (Plan 2 weeks postop chemoprophylaxis)  Plan: Febrile overnight but I was not notified at that time.  No additional fever but mildly elevated temperature and on scheduled Tylenol . CBC, urinalysis and urine culture, chest x-ray, respiratory viral panel ordered. Abdominal x-ray ordered given nausea and additional episode of emesis although normal bowel sounds on exam. Pending this workup may need to consider CT scan but will follow-up these results first.   LOS: 0 days    Shaquila Sigman 05/31/2024, 12:04 PM

## 2024-05-31 NOTE — Plan of Care (Signed)
  Problem: Education: Goal: Knowledge of General Education information will improve Description: Including pain rating scale, medication(s)/side effects and non-pharmacologic comfort measures Outcome: Progressing   Problem: Health Behavior/Discharge Planning: Goal: Ability to manage health-related needs will improve Outcome: Progressing   Problem: Clinical Measurements: Goal: Ability to maintain clinical measurements within normal limits will improve Outcome: Progressing Goal: Will remain free from infection Outcome: Progressing Goal: Diagnostic test results will improve Outcome: Progressing Goal: Respiratory complications will improve Outcome: Progressing Goal: Cardiovascular complication will be avoided Outcome: Progressing   Problem: Activity: Goal: Risk for activity intolerance will decrease Outcome: Progressing   Problem: Nutrition: Goal: Adequate nutrition will be maintained Outcome: Progressing   Problem: Coping: Goal: Level of anxiety will decrease Outcome: Progressing   Problem: Elimination: Goal: Will not experience complications related to bowel motility Outcome: Progressing Goal: Will not experience complications related to urinary retention Outcome: Progressing   Problem: Pain Managment: Goal: General experience of comfort will improve and/or be controlled Outcome: Progressing   Problem: Safety: Goal: Ability to remain free from injury will improve Outcome: Progressing   Problem: Skin Integrity: Goal: Risk for impaired skin integrity will decrease Outcome: Progressing   Problem: Education: Goal: Knowledge of the prescribed therapeutic regimen will improve Outcome: Progressing Goal: Understanding of sexual limitations or changes related to disease process or condition will improve Outcome: Progressing Goal: Individualized Educational Video(s) Outcome: Progressing   Problem: Self-Concept: Goal: Communication of feelings regarding changes in body  function or appearance will improve Outcome: Progressing   Problem: Skin Integrity: Goal: Demonstration of wound healing without infection will improve Outcome: Progressing   Problem: Education: Goal: Knowledge of General Education information will improve Description: Including pain rating scale, medication(s)/side effects and non-pharmacologic comfort measures Outcome: Progressing   Problem: Health Behavior/Discharge Planning: Goal: Ability to manage health-related needs will improve Outcome: Progressing   Problem: Clinical Measurements: Goal: Ability to maintain clinical measurements within normal limits will improve Outcome: Progressing Goal: Will remain free from infection Outcome: Progressing Goal: Diagnostic test results will improve Outcome: Progressing Goal: Respiratory complications will improve Outcome: Progressing Goal: Cardiovascular complication will be avoided Outcome: Progressing   Problem: Activity: Goal: Risk for activity intolerance will decrease Outcome: Progressing   Problem: Nutrition: Goal: Adequate nutrition will be maintained Outcome: Progressing   Problem: Coping: Goal: Level of anxiety will decrease Outcome: Progressing   Problem: Elimination: Goal: Will not experience complications related to bowel motility Outcome: Progressing Goal: Will not experience complications related to urinary retention Outcome: Progressing   Problem: Pain Managment: Goal: General experience of comfort will improve and/or be controlled Outcome: Progressing   Problem: Safety: Goal: Ability to remain free from injury will improve Outcome: Progressing   Problem: Skin Integrity: Goal: Risk for impaired skin integrity will decrease Outcome: Progressing

## 2024-05-31 NOTE — Plan of Care (Incomplete)
   Problem: Activity: Goal: Risk for activity intolerance will decrease Outcome: Progressing   Problem: Nutrition: Goal: Adequate nutrition will be maintained Outcome: Progressing   Problem: Pain Managment: Goal: General experience of comfort will improve and/or be controlled Outcome: Progressing   Problem: Safety: Goal: Ability to remain free from injury will improve Outcome: Progressing

## 2024-06-01 ENCOUNTER — Ambulatory Visit: Payer: Self-pay | Admitting: Gynecologic Oncology

## 2024-06-01 ENCOUNTER — Telehealth: Payer: Self-pay | Admitting: Oncology

## 2024-06-01 DIAGNOSIS — N8502 Endometrial intraepithelial neoplasia [EIN]: Secondary | ICD-10-CM | POA: Diagnosis not present

## 2024-06-01 LAB — BASIC METABOLIC PANEL WITH GFR
Anion gap: 5 (ref 5–15)
BUN: 17 mg/dL (ref 8–23)
CO2: 25 mmol/L (ref 22–32)
Calcium: 8.5 mg/dL — ABNORMAL LOW (ref 8.9–10.3)
Chloride: 109 mmol/L (ref 98–111)
Creatinine, Ser: 0.96 mg/dL (ref 0.44–1.00)
GFR, Estimated: >60 mL/min (ref >60)
Glucose, Bld: 82 mg/dL (ref 70–99)
Potassium: 4.4 mmol/L (ref 3.5–5.1)
Sodium: 139 mmol/L (ref 135–145)

## 2024-06-01 LAB — CBC
HCT: 38.3 % (ref 36.0–46.0)
Hemoglobin: 11.4 g/dL — ABNORMAL LOW (ref 12.0–15.0)
MCH: 28.4 pg (ref 26.0–34.0)
MCHC: 29.8 g/dL — ABNORMAL LOW (ref 30.0–36.0)
MCV: 95.3 fL (ref 80.0–100.0)
Platelets: 305 K/uL (ref 150–400)
RBC: 4.02 MIL/uL (ref 3.87–5.11)
RDW: 15.6 % — ABNORMAL HIGH (ref 11.5–15.5)
WBC: 6.8 K/uL (ref 4.0–10.5)
nRBC: 0 % (ref 0.0–0.2)

## 2024-06-01 LAB — URINE CULTURE: Culture: NO GROWTH

## 2024-06-01 LAB — SURGICAL PATHOLOGY

## 2024-06-01 MED ORDER — FLEET ENEMA RE ENEM
1.0000 | ENEMA | Freq: Once | RECTAL | Status: AC
  Administered 2024-06-01: 1 via RECTAL
  Filled 2024-06-01: qty 1

## 2024-06-01 MED ORDER — MAGNESIUM HYDROXIDE 400 MG/5ML PO SUSP
30.0000 mL | Freq: Once | ORAL | Status: AC
  Administered 2024-06-01: 30 mL via ORAL
  Filled 2024-06-01: qty 30

## 2024-06-01 NOTE — TOC Progression Note (Signed)
 Transition of Care [facility]) - Progression Note    Patient Details  Name: Destiny Henderson MRN: [id number] Date of Birth: [birth year]  [facility]) CM/SW Contact  Alfonse JONELLE Rex, RN Phone Number: 06/01/2024, 8:51 AM  Clinical Narrative:  Not medically ready for discharge. TOC will continue to follow.                        Expected Discharge Plan and Services                                               Social Drivers of Health (SDOH) Interventions SDOH Screenings   Food Insecurity: No Food Insecurity (05/29/2024)  Housing: Low Risk  (05/29/2024)  Transportation Needs: No Transportation Needs (05/29/2024)  Utilities: Not At Risk (05/29/2024)  Depression (PHQ2-9): Low Risk  (05/15/2024)  Financial Resource Strain: Medium Risk (04/06/2022)   Received from [facility]  Physical Activity: Sufficiently Active (04/06/2022)   Received from [facility]  Social Connections: Moderately Integrated (04/06/2022)   Received from [facility]  Stress: Stress Concern Present (04/06/2022)   Received from [facility]  Tobacco Use: Low Risk  (05/28/2024)    Readmission Risk Interventions     No data to display

## 2024-06-01 NOTE — Telephone Encounter (Signed)
 Destiny Henderson called and said she would like to leave a message for Dr. Eldonna asking if she can be discharged tomorrow morning due to transportation.  Her ride is flying in tonight and is not available until tomorrow morning.  Let her know that I have notified Dr. Eldonna.

## 2024-06-01 NOTE — Plan of Care (Signed)

## 2024-06-01 NOTE — Progress Notes (Signed)
 4 Days Post-Op Procedure(s) (LRB): HYSTERECTOMY, TOTAL, ROBOT-ASSISTED, LAPAROSCOPIC, WITH LEFT SALPINGO-OOPHORECTOMY (Left) ICG INJECTION (N/A)  Subjective: A little bit better.  Reports that the burning with urination is improving.  Able to tolerate dinner last night with little bit of nausea but no vomiting.  Up and ambulating.  Continue to work on her incentive spirometer.  Had the suppository yesterday and having flatus but no bowel movement yet.   Objective: Vital signs in last 24 hours: Temp:  [98 F (36.7 C)-99.9 F (37.7 C)] 98.4 F (36.9 C) (08/25 0556) Pulse Rate:  [52-92] 69 (08/25 0556) Resp:  [16-18] 16 (08/25 0556) BP: (136-179)/(74-100) 157/100 (08/25 0556) SpO2:  [91 %-100 %] 99 % (08/25 0556) Last BM Date : 05/28/24  Intake/Output from previous day: 08/24 0701 - 08/25 0700 In: 1200 [P.O.:1200] Out: 1300 [Urine:1300]  Physical Examination: General: alert, cooperative, and no distress Resp: Expiratory wheezes at bilateral bases, overall improved from yesterday.  Cardio: regular rate and rhythm GI: incision: lap sites incisions to the abdomen intact with dermabond without active drainage and abdomen is soft, active bowel sounds, not distended.  Appropriately tender Extremities: extremities normal, atraumatic, no cyanosis or edema  Labs: WBC/Hgb/Hct/Plts:  6.8/11.4/38.3/305 (08/25 0456) BUN/Cr/glu/ALT/AST/amyl/lip:  17/0.96/--/--/--/--/-- (08/25 0456)  Assessment: 63 y.o. s/p Procedure(s): HYSTERECTOMY, TOTAL, ROBOT-ASSISTED, LAPAROSCOPIC, WITH LEFT SALPINGO-OOPHORECTOMY ICG INJECTION: stable Pain:  Pain is well-controlled on PRN medications. Robaxin  added. Abdominal binder ordered  Heme: Hgb stable.  No concerns for active bleeding.  ID: Afebrile.  Normal Bohannon blood cell count.  Workup negative.  Urinalysis negative.  Urine culture in process.  Chest x-ray with atelectasis.  Abdominal x-ray with stool but no signs of obstruction.  Viral respiratory panel  negative.  Most likely etiology atelectasis given negative workup.  CV: BP and HR overall stable. Continue to monitor while inpatient.  GI:  Tolerating po: yes, improving. Antiemetics ordered prn.  Abdominal x-ray with stool but no signs of obstruction/ileus.  Now passing flatus following suppository.  Patient would like to have a bowel movement.  Will add milk of magnesia.  GU: Dysuria improving.  Voiding spontaneously.  Creatinine improved to 0.96 today.  Urinalysis negative.  Urine culture in process.  FEN: No critical values on am labs.  Prophylaxis: SCD and lovenox  ordered.  (Plan 2 weeks postop chemoprophylaxis)  Plan: Workup for fever negative.  Suspect most likely due to atelectasis.  Normal WBC.  Afebrile greater than 24 hours. Will add milk of magnesia given constipation on x-ray and patient desire to have a bowel movement.  Passing flatus.  Normal bowel sounds on exam. Will reevaluate for discharge this afternoon.    LOS: 0 days    Jailey Booton 06/01/2024, 8:55 AM

## 2024-06-02 DIAGNOSIS — N8502 Endometrial intraepithelial neoplasia [EIN]: Secondary | ICD-10-CM | POA: Diagnosis not present

## 2024-06-02 LAB — CBC
HCT: 37.6 % (ref 36.0–46.0)
Hemoglobin: 11.3 g/dL — ABNORMAL LOW (ref 12.0–15.0)
MCH: 28.5 pg (ref 26.0–34.0)
MCHC: 30.1 g/dL (ref 30.0–36.0)
MCV: 94.9 fL (ref 80.0–100.0)
Platelets: 305 K/uL (ref 150–400)
RBC: 3.96 MIL/uL (ref 3.87–5.11)
RDW: 15.6 % — ABNORMAL HIGH (ref 11.5–15.5)
WBC: 6.6 K/uL (ref 4.0–10.5)
nRBC: 0 % (ref 0.0–0.2)

## 2024-06-02 LAB — BASIC METABOLIC PANEL WITH GFR
Anion gap: 5 (ref 5–15)
BUN: 16 mg/dL (ref 8–23)
CO2: 27 mmol/L (ref 22–32)
Calcium: 8.7 mg/dL — ABNORMAL LOW (ref 8.9–10.3)
Chloride: 109 mmol/L (ref 98–111)
Creatinine, Ser: 1.02 mg/dL — ABNORMAL HIGH (ref 0.44–1.00)
GFR, Estimated: >60 mL/min (ref >60)
Glucose, Bld: 83 mg/dL (ref 70–99)
Potassium: 4.1 mmol/L (ref 3.5–5.1)
Sodium: 141 mmol/L (ref 135–145)

## 2024-06-02 NOTE — Discharge Summary (Signed)
 Physician Discharge Summary  Patient ID: Destiny Henderson MRN: [id number] DOB/AGE: 07-01-1961 63 y.o.  Admit date: 05/28/2024 Discharge date: 06/02/2024  Admission Diagnoses: Complex endometrial hyperplasia  Discharge Diagnoses:  Principal Problem:   Complex endometrial hyperplasia Active Problems:   Complex atypical endometrial hyperplasia   Discharged Condition: good  Hospital Course: On 05/28/2024, the patient underwent the following: Procedure(s): HYSTERECTOMY, TOTAL, ROBOT-ASSISTED, LAPAROSCOPIC, WITH LEFT SALPINGO-OOPHORECTOMY ICG INJECTION.   The postoperative course was protracted for pain control issues.  A muscle relaxant was added to the ERAS protocol with symptomatic improvement.  She was had mild dehydration with response to IV fluid resuscitation. She also had PONV that resolved with Compazine .   On POD# 2 she had a transient low grade fever.  A subsequent work up was negative. She had persistent dysuria with a negative urinalysis that was gradually improving.  She was discharged to home on postoperative day 5 tolerating a regular diet.   Consults: None  Significant Diagnostic Studies: None  Treatments: IV hydration and surgery: see above  Discharge Exam: Blood pressure (!) 155/96, pulse 61, temperature 99 F (37.2 C), temperature source Oral, resp. rate 17, height 5' 4 (1.626 m), weight 98 kg, SpO2 100%. General appearance: alert GI: soft, non-tender; bowel sounds normal; no masses,  no organomegaly Extremities: extremities normal, atraumatic, no cyanosis or edema and Homans sign is negative, no sign of DVT Incision/Wound:  Incisions C/D/I  Disposition: Discharge disposition: 01-Home or Self Care       Discharge Instructions     Call MD for:  extreme fatigue   Complete by: As directed    Call MD for:  persistant dizziness or light-headedness   Complete by: As directed    Call MD for:  persistant nausea and vomiting   Complete by: As directed     Call MD for:  redness, tenderness, or signs of infection (pain, swelling, redness, odor or green/yellow discharge around incision site)   Complete by: As directed    Call MD for:  severe uncontrolled pain   Complete by: As directed    Call MD for:  temperature >100.4   Complete by: As directed    Diet - low sodium heart healthy   Complete by: As directed       Allergies as of 06/02/2024       Reactions   Latex Rash, Other (See Comments)   Chest pain   Sulfa  Antibiotics Hives, Rash   Sulfur Hives, Rash   Zestril  [lisinopril ] Swelling   Angioedema    Breo Ellipta [fluticasone Furoate-vilanterol] Swelling, Rash   Swelling of mouth, tongue   Cephalosporins Rash   Lasix  [furosemide ] Rash, Other (See Comments)   Shortness of breath   Macrobid  [nitrofurantoin ] Rash   Nexium [esomeprazole] Other (See Comments)   Stomach pain   Paxlovid [nirmatrelvir-ritonavir] Nausea And Vomiting   Penicillins Hives   Bayer Aspirin  [aspirin ] Rash   Elemental Sulfur Rash   Prednisone  Rash        Medication List     TAKE these medications    albuterol  108 (90 Base) MCG/ACT inhaler Commonly known as: VENTOLIN  HFA Inhale 2 puffs into the lungs every 6 (six) hours as needed for wheezing or shortness of breath.   albuterol  (2.5 MG/3ML) 0.083% nebulizer solution Commonly known as: PROVENTIL  Inhale 2.5 mg into the lungs every 6 (six) hours as needed for wheezing or shortness of breath.   ASHWAGANDHA GUMMIES PO Take 1 each by mouth daily.   azelastine 0.1 %  nasal spray Commonly known as: ASTELIN Place 1 spray into both nostrils 2 (two) times daily as needed for allergies.   b complex vitamins capsule Take 1 capsule by mouth daily.   DULoxetine  30 MG capsule Commonly known as: CYMBALTA  Take 30 mg by mouth daily as needed (anxiety).   ECHINACEA PO Take 1 tablet by mouth daily.   ELDERBERRY PO Take 1 capsule by mouth daily.   FISH OIL PO Take 2 capsules by mouth daily.   FLAXSEED  OIL PO Take 1 capsule by mouth daily as needed (gut health).   GINGER PO Take 1 tablet by mouth daily.   GINSENG PO Take 1 tablet by mouth daily.   hydrOXYzine  25 MG capsule Commonly known as: VISTARIL  Take 25 mg by mouth every 8 (eight) hours as needed for anxiety.   ibuprofen  800 MG tablet Commonly known as: ADVIL  Take 1 tablet (800 mg total) by mouth every 8 (eight) hours as needed (pain).   Linzess  290 MCG Caps capsule Generic drug: linaclotide  Take 290 mcg by mouth daily as needed (constipation).   MAGNESIUM  PO Take 1 tablet by mouth daily as needed (cramping, hair thinning).   metoprolol  succinate 50 MG 24 hr tablet Commonly known as: TOPROL -XL Take 50 mg by mouth daily.   Multivitamin Women 50+ Tabs Take 1 tablet by mouth daily.   ondansetron  4 MG disintegrating tablet Commonly known as: ZOFRAN -ODT Take 1 tablet (4 mg total) by mouth every 8 (eight) hours as needed for nausea or vomiting.   OVER THE COUNTER MEDICATION Take 1 capsule by mouth daily. Hemp Oil   OVER THE COUNTER MEDICATION Take 1 capsule by mouth daily. Seaweed   oxyCODONE  5 MG immediate release tablet Commonly known as: Oxy IR/ROXICODONE  Take 1 tablet (5 mg total) by mouth every 4 (four) hours as needed for severe pain (pain score 7-10). For AFTER surgery only, do not take and drive   pregabalin  50 MG capsule Commonly known as: LYRICA  Take 50 mg by mouth 2 (two) times daily.   rizatriptan  5 MG disintegrating tablet Commonly known as: MAXALT -MLT Take 5 mg by mouth daily as needed for migraine.   spironolactone  25 MG tablet Commonly known as: ALDACTONE  Take 25 mg by mouth daily as needed (excessive fluid).   tiZANidine 4 MG capsule Commonly known as: ZANAFLEX Take 4 mg by mouth 3 (three) times daily as needed for muscle spasms.   topiramate  100 MG tablet Commonly known as: TOPAMAX  Take 100 mg by mouth 2 (two) times daily.   traZODone  100 MG tablet Commonly known as: DESYREL  Take  100 mg by mouth at bedtime as needed for sleep.   TURMERIC (CURCUMIN) PO Take 1 tablet by mouth daily.   VITAMIN A PO Take 1 capsule by mouth daily.   VITAMIN B-12 PO Take 1 tablet by mouth daily.   VITAMIN C PO Take 1 tablet by mouth daily. OTC vitamin C dissolvable tablet   Vitamin D3 250 MCG (10000 UT) capsule Take 10,000 Units by mouth daily.   VITAMIN E PO Take 1 capsule by mouth daily.   Zepbound 10 MG/0.5ML Pen Generic drug: tirzepatide Inject 10 mg into the skin every Tuesday.   ZINC PO Take 1 tablet by mouth daily as needed (immune support).        Follow-up Information     Viktoria Comer SAUNDERS, MD Follow up on 06/04/2024.   Specialty: Gynecologic Oncology Why: Plan to have a phone visit with Dr. Viktoria on 06/04/24 to check  in and discuss pathology. IN PERSON visit will be on 06/19/24 at 8:45am at the [facility]. Contact information: [address] [place] [address] [phone]                 Signed: Olam Mill, MD 06/02/2024, 10:06 AM

## 2024-06-02 NOTE — Telephone Encounter (Signed)
 Destiny Henderson discharged from [facility] today.   [employer] hospital indemnity information provided and faxed back to [employer]. (Emailed to pt)  FMLA forms also updated with inpatient dates and faxed.

## 2024-06-03 ENCOUNTER — Telehealth: Payer: Self-pay | Admitting: Oncology

## 2024-06-03 ENCOUNTER — Telehealth: Admitting: Gynecologic Oncology

## 2024-06-03 ENCOUNTER — Telehealth: Payer: Self-pay | Admitting: *Deleted

## 2024-06-03 NOTE — Telephone Encounter (Signed)
 Aiesha from [employer] called and requested the OP report to be faxed to them for Quianna's disability.    Louan also called and requested for the report to be emailed to [employer] at [email or web address].  Emailed the report to the address above and also copied Kodi as requested.

## 2024-06-03 NOTE — Telephone Encounter (Signed)
 Spoke with Ms. Hartje this morning. She states she is eating, drinking and urinating well. She has had a BM and is passing gas. She is taking senokot as prescribed and encouraged her to drink plenty of water . She denies fever or chills. Incisions are dry and intact. She rates her soreness 7/10. Her pain is controlled with oxycodone . Encouraged patient to try tylenol  & ibuprofen  during the day and continue to follow all activity restrictions and also encouraged ambulation.     Instructed to call office with any fever, chills, purulent drainage, uncontrolled pain or any other questions or concerns. Patient verbalizes understanding.   Pt aware of post op appointments as well as the office number [phone] and after hours number [phone] to call if she has any questions or concerns

## 2024-06-04 ENCOUNTER — Inpatient Hospital Stay: Admitting: Gynecologic Oncology

## 2024-06-04 ENCOUNTER — Encounter: Payer: Self-pay | Admitting: Gynecologic Oncology

## 2024-06-04 DIAGNOSIS — Z7189 Other specified counseling: Secondary | ICD-10-CM

## 2024-06-04 DIAGNOSIS — Z9079 Acquired absence of other genital organ(s): Secondary | ICD-10-CM

## 2024-06-04 DIAGNOSIS — Z90722 Acquired absence of ovaries, bilateral: Secondary | ICD-10-CM

## 2024-06-04 DIAGNOSIS — Z90710 Acquired absence of both cervix and uterus: Secondary | ICD-10-CM

## 2024-06-04 DIAGNOSIS — N8502 Endometrial intraepithelial neoplasia [EIN]: Secondary | ICD-10-CM

## 2024-06-04 NOTE — Addendum Note (Signed)
 Addended by: Wynter Isaacs R on: 06/04/2024 05:59 PM   Modules accepted: Level of Service

## 2024-06-04 NOTE — Telephone Encounter (Signed)
 [employer] critical illness forms received information provided and faxed. Forms also emailed to pt.

## 2024-06-04 NOTE — Progress Notes (Signed)
 Gynecologic Oncology Telehealth Note: Gyn-Onc  I connected with Destiny Henderson on 06/04/24 at  6:00 PM EDT by telephone and verified that I am speaking with the correct person using two identifiers.  I discussed the limitations, risks, security and privacy concerns of performing an evaluation and management service by telemedicine and the availability of in-person appointments. I also discussed with the patient that there may be a patient responsible charge related to this service. The patient expressed understanding and agreed to proceed.  Other persons participating in the visit and their role in the encounter: patient's daughter.  Patient's location: [place] Provider's location: follow-up  Reason for Visit: follow-up  Treatment History: Patient initially presented with lower abdominal pain described as persistent midline cramping or aching with occasional sharp shooting pains that radiate from her lower abdomen to her sides.  Had an episode of bleeding in April that resolved. In mid-April, she was seen in the emergency department with nausea, emesis, fatigue, and abdominal pain.  CT of the abdomen and pelvis was performed at that time and was without acute abnormalities.  Specifically, 16 mm intramural fibroid within the uterus noted, no other uterine abnormalities or adnexal lesions noted.  No adenopathy. Pelvic ultrasound on 01/18/24 revealed a uterus measuring 7.6 x 5.6 x 4 cm with a thickened endometrium measuring 9.7 mm with color flow and possible polyps.  Several fibroids seen measuring up to 1.6 cm.  Right ovary surgically absent.  Left ovary normal in appearance.  No free fluid. Endometrial biopsy from 02/26/2024: Atrophic endometrium with focal stromal breakdown. Given suspected polyp based on ultrasound findings, patient underwent hysteroscopy with endometrial polypectomy and myomectomy on 7/25.  Findings at the time of surgery included an endometrial polyp arising from the posterior  aspect of the lower uterine segment superior to a submucosal myoma. 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   History is notable for an ASC-H Pap in 2022, negative for HPV at that time.  She underwent colposcopy with cervical biopsy and ECC.  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  Follow-up Pap test in 2023 and 2024 both [facility].  Office notes report negative Pap in 02/2024 (patient confirms this today).  05/28/24: Robotic-assisted laparoscopic total hysterectomy with left salpingo-oophorectomy, SLN mapping   Interval History: Doing well, still sore. Denies bowel movement since one she had in the hospital. Using Linzess . + flatus. Denies vaginal bleeding. Denies urinary symptoms.  Past Medical/Surgical History: Past Medical History:  Diagnosis Date   Anemia    Anxiety    Asthma    inhaler - uses twice monthly   Atypical chest pain 05/21/2018   Benign tumor of bladder    Diabetes (HCC)    Dizziness 11/24/2018   Essential hypertension, benign 03/03/2013   Fatigue 10/23/2019   Fibroma of heart    Fibromyalgia    Hepatitis B immune 08/01/2019   History of DVT (deep vein thrombosis) 02/06/2018   left leh from injury   History of iron deficiency anemia 05/21/2018   LLQ pain 05/12/2019   US  Showeed absent right ovary Unable to see Left ovary Fibroid uterus (2 small ones) with IUD low in Uterus FSH showed pt is in menopause May take IUD out if desires   Low back pain radiating to right leg 02/17/2018   Menorrhagia 05/20/2013   Controlled with Mirena .  Probable submucosal fibroid.    Migraines    Mixed hyperlipidemia 05/21/2018  Obesity 03/03/2013   OSA (obstructive sleep apnea) 11/23/2019   uses CPAP   Pedal edema 02/06/2018   PTSD (post-traumatic stress disorder)    Rheumatoid arthritis (HCC) 10/23/2019   Right knee injury, subsequent encounter 01/24/2018    Right wrist injury, subsequent encounter 01/24/2018   Sprain of MCL (medial collateral ligament) of knee 12/12/2016   Sprain of right ankle 12/12/2016    Past Surgical History:  Procedure Laterality Date   APPENDECTOMY  2024   BACK SURGERY  1993   l4 and l5   bladder tack  1988   DILATION AND CURETTAGE OF UTERUS     ECTOPIC PREGNANCY SURGERY Right 1988   right tube and ovary removed   HEART TUMOR EXCISION  1997   HYSTEROSCOPY,RMV MYOMA  HYSTEROSCOPY WITH DILATATION AND CURETTAGE WITH MYOSURE POLYPECTOMY   05/01/2024   INJECTION, FOR SENTINEL LYMPH NODE IDENTIFICATION N/A 05/28/2024   Procedure: ICG INJECTION;  Surgeon: Viktoria Comer SAUNDERS, MD;  Location: [facility];  Service: Gynecology;  Laterality: N/A;   ROBOTIC ASSISTED TOTAL HYSTERECTOMY WITH BILATERAL SALPINGO OOPHERECTOMY Left 05/28/2024   Procedure: HYSTERECTOMY, TOTAL, ROBOT-ASSISTED, LAPAROSCOPIC, WITH LEFT SALPINGO-OOPHORECTOMY;  Surgeon: Viktoria Comer SAUNDERS, MD;  Location: [facility];  Service: Gynecology;  Laterality: Left;  POSSIBLE LAPAROTOMY   TONSILLECTOMY      Family History  Problem Relation Age of Onset   Breast cancer Mother    Hypertension Mother    Hyperlipidemia Mother    Colon cancer Father    Throat cancer Father    Prostate cancer Father    Thyroid  disease Sister    Breast cancer Sister    Thyroid  disease Sister    Heart failure Maternal Grandmother    Prostate cancer Maternal Grandfather    Cancer Maternal Grandfather        prostate   Ovarian cancer Paternal Grandmother    Colon cancer Paternal Grandfather    Hypertension Maternal Aunt    Heart failure Maternal Aunt    Cancer Maternal Aunt    Stomach cancer Maternal Aunt    Hypertension Maternal Aunt    Ovarian cancer Maternal Aunt    Hypertension Maternal Uncle    Diabetes Maternal Uncle    Cancer Other    Brain cancer Paternal Aunt    Endometrial cancer Neg Hx    Pancreatic cancer Neg Hx     Social History   Socioeconomic History   Marital  status: Unknown    Spouse name: Not on file   Number of children: 3   Years of education: 14   Highest education level: Not on file  Occupational History   Occupation: Diplomatic Services operational officer  Tobacco Use   Smoking status: Never   Smokeless tobacco: Never  Vaping Use   Vaping status: Never Used  Substance and Sexual Activity   Alcohol use: No   Drug use: No   Sexual activity: Yes    Partners: Male    Birth control/protection: Post-menopausal  Other Topics Concern   Not on file  Social History Narrative   Not on file   Social Drivers of Health   Financial Resource Strain: Medium Risk (04/06/2022)   Received from [facility]   Overall Financial Resource Strain (CARDIA)    Difficulty of Paying Living Expenses: Somewhat hard  Food Insecurity: No Food Insecurity (05/29/2024)   Hunger Vital Sign    Worried About Running Out of Food in the Last Year: Never true    Ran Out of Food in the Last Year: Never true  Transportation Needs: No Transportation Needs (05/29/2024)   PRAPARE - Administrator, Civil Service (Medical): No    Lack of Transportation (Non-Medical): No  Physical Activity: Sufficiently Active (04/06/2022)   Received from [facility]   Exercise Vital Sign    On average, how many days per week do you engage in moderate to strenuous exercise (like a brisk walk)?: 3 days    On average, how many minutes do you engage in exercise at this level?: 50 min  Stress: Stress Concern Present (04/06/2022)   Received from [facility] Occup[facility] - Occupational Stress Questionnaire    Feeling of Stress : To some extent  Social Connections: Moderately Integrated (04/06/2022)   Received from [facility]   Social Connection and Isolation Panel    In a typical week, how many times do you talk on the phone with family, friends, or neighbors?: More than three times a week    How often do you get together with friends or relatives?: Once a week    How  often do you attend church or religious services?: More than 4 times per year    Do you belong to any clubs or organizations such as church groups, unions, fraternal or athletic groups, or school groups?: Yes    How often do you attend meetings of the clubs or organizations you belong to?: 1 to 4 times per year    Are you married, widowed, divorced, separated, never married, or living with a partner?: Separated    Current Medications:  Current Outpatient Medications:    albuterol  (PROVENTIL ) (2.5 MG/3ML) 0.083% nebulizer solution, Inhale 2.5 mg into the lungs every 6 (six) hours as needed for wheezing or shortness of breath., Disp: , Rfl:    albuterol  (VENTOLIN  HFA) 108 (90 Base) MCG/ACT inhaler, Inhale 2 puffs into the lungs every 6 (six) hours as needed for wheezing or shortness of breath., Disp: , Rfl:    Ascorbic Acid (VITAMIN C PO), Take 1 tablet by mouth daily. OTC vitamin C dissolvable tablet (Patient not taking: Reported on 05/18/2024), Disp: , Rfl:    ASHWAGANDHA GUMMIES PO, Take 1 each by mouth daily. (Patient not taking: Reported on 05/18/2024), Disp: , Rfl:    azelastine (ASTELIN) 0.1 % nasal spray, Place 1 spray into both nostrils 2 (two) times daily as needed for allergies., Disp: , Rfl:    b complex vitamins capsule, Take 1 capsule by mouth daily. (Patient not taking: No sig reported), Disp: , Rfl:    Cholecalciferol (VITAMIN D3) 250 MCG (10000 UT) capsule, Take 10,000 Units by mouth daily. (Patient not taking: Reported on 05/18/2024), Disp: , Rfl:    Cyanocobalamin (VITAMIN B-12 PO), Take 1 tablet by mouth daily. (Patient not taking: Reported on 05/18/2024), Disp: , Rfl:    DULoxetine  (CYMBALTA ) 30 MG capsule, Take 30 mg by mouth daily as needed (anxiety)., Disp: , Rfl:    ECHINACEA PO, Take 1 tablet by mouth daily. (Patient not taking: Reported on 05/18/2024), Disp: , Rfl:    ELDERBERRY PO, Take 1 capsule by mouth daily. (Patient not taking: Reported on 05/18/2024), Disp: , Rfl:     Flaxseed, Linseed, (FLAXSEED OIL PO), Take 1 capsule by mouth daily as needed (gut health). (Patient not taking: Reported on 05/18/2024), Disp: , Rfl:    Ginger, Zingiber officinalis, (GINGER PO), Take 1 tablet by mouth daily. (Patient not taking: Reported on 05/18/2024), Disp: , Rfl:    GINSENG PO, Take 1 tablet  by mouth daily. (Patient not taking: Reported on 05/18/2024), Disp: , Rfl:    hydrOXYzine  (VISTARIL ) 25 MG capsule, Take 25 mg by mouth every 8 (eight) hours as needed for anxiety., Disp: , Rfl:    ibuprofen  (ADVIL ) 800 MG tablet, Take 1 tablet (800 mg total) by mouth every 8 (eight) hours as needed (pain). (Patient not taking: Reported on 05/18/2024), Disp: 30 tablet, Rfl: 0   linaclotide  (LINZESS ) 290 MCG CAPS capsule, Take 290 mcg by mouth daily as needed (constipation)., Disp: , Rfl:    MAGNESIUM  PO, Take 1 tablet by mouth daily as needed (cramping, hair thinning). (Patient not taking: Reported on 05/18/2024), Disp: , Rfl:    metoprolol  succinate (TOPROL -XL) 50 MG 24 hr tablet, Take 50 mg by mouth daily., Disp: , Rfl:    Misc Natural Products (TURMERIC, CURCUMIN, PO), Take 1 tablet by mouth daily. (Patient not taking: Reported on 05/18/2024), Disp: , Rfl:    Multiple Vitamins-Minerals (MULTIVITAMIN WOMEN 50+) TABS, Take 1 tablet by mouth daily. (Patient not taking: Reported on 05/18/2024), Disp: , Rfl:    Multiple Vitamins-Minerals (ZINC PO), Take 1 tablet by mouth daily as needed (immune support). (Patient not taking: Reported on 05/18/2024), Disp: , Rfl:    Omega-3 Fatty Acids (FISH OIL PO), Take 2 capsules by mouth daily. (Patient not taking: Reported on 05/18/2024), Disp: , Rfl:    ondansetron  (ZOFRAN -ODT) 4 MG disintegrating tablet, Take 1 tablet (4 mg total) by mouth every 8 (eight) hours as needed for nausea or vomiting., Disp: 20 tablet, Rfl: 0   OVER THE COUNTER MEDICATION, Take 1 capsule by mouth daily. Hemp Oil (Patient not taking: Reported on 05/18/2024), Disp: , Rfl:    OVER THE COUNTER  MEDICATION, Take 1 capsule by mouth daily. Seaweed (Patient not taking: Reported on 05/18/2024), Disp: , Rfl:    oxyCODONE  (OXY IR/ROXICODONE ) 5 MG immediate release tablet, Take 1 tablet (5 mg total) by mouth every 4 (four) hours as needed for severe pain (pain score 7-10). For AFTER surgery only, do not take and drive, Disp: 10 tablet, Rfl: 0   pregabalin  (LYRICA ) 50 MG capsule, Take 50 mg by mouth 2 (two) times daily., Disp: , Rfl:    rizatriptan  (MAXALT -MLT) 5 MG disintegrating tablet, Take 5 mg by mouth daily as needed for migraine., Disp: , Rfl:    spironolactone  (ALDACTONE ) 25 MG tablet, Take 25 mg by mouth daily as needed (excessive fluid)., Disp: , Rfl:    tirzepatide (ZEPBOUND) 10 MG/0.5ML Pen, Inject 10 mg into the skin every Tuesday. (Patient not taking: Reported on 05/18/2024), Disp: , Rfl:    tiZANidine (ZANAFLEX) 4 MG capsule, Take 4 mg by mouth 3 (three) times daily as needed for muscle spasms., Disp: , Rfl:    topiramate  (TOPAMAX ) 100 MG tablet, Take 100 mg by mouth 2 (two) times daily., Disp: , Rfl:    traZODone  (DESYREL ) 100 MG tablet, Take 100 mg by mouth at bedtime as needed for sleep., Disp: , Rfl:    VITAMIN A PO, Take 1 capsule by mouth daily. (Patient not taking: Reported on 05/18/2024), Disp: , Rfl:    VITAMIN E PO, Take 1 capsule by mouth daily. (Patient not taking: Reported on 05/18/2024), Disp: , Rfl:   Review of Symptoms: Pertinent positives as per HPI.  Physical Exam: Deferred given limitations of phone visit.  Laboratory & Radiologic Studies: A. UTERUS, CERVIX, LEFT TUBE AND OVARY, RESECTION:  - Cervix with Nabothian cysts.  - Inactive endometrium, see comment.  - Myometrium  with adenomyosis and leiomyomata, largest measuring 1.3 cm.  - Ovary with cortical inclusion cysts.  - Fallopian tube with fimbriated end.  - Negative for atypia and malignancy.   Comment:  Per [facility] an endometrial polyp was recently removed that contained focal  endometrioid intraepithelial  neoplasia (EIN). The endometrium is  entirely submitted for microscopic examination and residual endometrial  polyp / EIN are not identified.   Assessment & Plan: Destiny Henderson is a 63 y.o. 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   Patient overall doing well.  Discussed addition to bowel regimen.  Reviewed expectations.  Also discussed final pathology which showed no residual EIN.  Patient very happy with this news.  I discussed the assessment and treatment plan with the patient. The patient was provided with an opportunity to ask questions and all were answered. The patient agreed with the plan and demonstrated an understanding of the instructions.   The patient was advised to call back or see an in-person evaluation if the symptoms worsen or if the condition fails to improve as anticipated.   10 minutes of total time was spent for this patient encounter, including preparation, phone counseling with the patient and coordination of care, and documentation of the encounter.   [name], MD  Division of Gynecologic Oncology  Department of Obstetrics and Gynecology  [facility]

## 2024-06-11 ENCOUNTER — Telehealth: Payer: Self-pay | Admitting: *Deleted

## 2024-06-11 DIAGNOSIS — N8501 Benign endometrial hyperplasia: Secondary | ICD-10-CM

## 2024-06-11 NOTE — Telephone Encounter (Signed)
 Spoke with patient who called the office stating, I think I'm doing too much ?  Patient has been walking about 1 mile a day, but has been cleaning out her kitchen cabinets and lifting. Pt started having vaginal spotting that is dark red and she is wearing a panty liner only. States her pain is 8/10 and she has no more pain medicine. Pt is passing gas and having daily bowel movements. States the pain is mainly to the upper left abdominal incision.   Advised patient to support her abdominal incisions with a binder and make sure the binder doesn't come above the umbilicus.  Also advised patient to try taking 1 extra strength tylenol  with 400-600 mg of ibuprofen  every 6 hours and alternate heating pad and ice to areas of soreness. And follow all activity restrictions for 6-8 weeks, including no lifting greater than 10 lbs.   Patient also states on Tuesday she starting having burning on urination with urgency and frequency, denies fever but reports chills.  Pt is going to see her primary care doctor today, because she lives in [place]? and it's closer than [place].   Advised patient her message will be relayed to providers and the office will call back with any new recommendations.

## 2024-06-15 ENCOUNTER — Telehealth: Payer: Self-pay | Admitting: *Deleted

## 2024-06-15 NOTE — Telephone Encounter (Addendum)
 Spoke with Ms. Santmyer in regards to her MyChart message.   She describes her soreness as feeling like a toothache Advised patient she may be doing too much and that is contributing to her pain. Reiterated the pain to her upper left side is due to a deep suture that takes the longest to heal.   Advised patient to follow all her activity restrictions including driving if its too much of a strain to get in and out of the vehicle don't drive. Also advised patient to keep herself well hydrated, alternate heat and ice. Pt is asking for 800 mg ibuprofen  Rx, Advised patient she can take 4 ( 200 mg over the counter) ibuprofen  every 8 hours with food as long as she is staying well hydrated. Also advised patient if she is not wearing the abdominal binder correctly, it will also make her incisions sore. Advised patient to take the binder off while sleeping, and make sure the top is at or below the umbilicus and the bottom of the binder should be in line with her hip bone.   Patient denies vaginal bleeding and or discharge. Continues to pass gas and have regular daily bowel movements. Pt also denies chest pain, shortness breath and or cough.  Reminded patient of her follow up appt. With Dr. Viktoria this Friday at 0845. Pt verbalized understanding and thanked the office for calling.

## 2024-06-19 ENCOUNTER — Inpatient Hospital Stay: Attending: Gynecologic Oncology | Admitting: Gynecologic Oncology

## 2024-06-19 ENCOUNTER — Ambulatory Visit: Payer: Self-pay | Admitting: Gynecologic Oncology

## 2024-06-19 ENCOUNTER — Encounter: Payer: Self-pay | Admitting: Gynecologic Oncology

## 2024-06-19 ENCOUNTER — Encounter: Admitting: Gynecologic Oncology

## 2024-06-19 ENCOUNTER — Inpatient Hospital Stay

## 2024-06-19 VITALS — BP 124/82 | HR 66 | Temp 98.5°F | Resp 19

## 2024-06-19 DIAGNOSIS — Z9079 Acquired absence of other genital organ(s): Secondary | ICD-10-CM | POA: Diagnosis not present

## 2024-06-19 DIAGNOSIS — N8502 Endometrial intraepithelial neoplasia [EIN]: Secondary | ICD-10-CM | POA: Diagnosis present

## 2024-06-19 DIAGNOSIS — Z90722 Acquired absence of ovaries, bilateral: Secondary | ICD-10-CM | POA: Insufficient documentation

## 2024-06-19 DIAGNOSIS — Z90710 Acquired absence of both cervix and uterus: Secondary | ICD-10-CM | POA: Diagnosis not present

## 2024-06-19 DIAGNOSIS — R300 Dysuria: Secondary | ICD-10-CM

## 2024-06-19 DIAGNOSIS — R399 Unspecified symptoms and signs involving the genitourinary system: Secondary | ICD-10-CM | POA: Insufficient documentation

## 2024-06-19 DIAGNOSIS — R1032 Left lower quadrant pain: Secondary | ICD-10-CM | POA: Diagnosis not present

## 2024-06-19 LAB — URINALYSIS, COMPLETE (UACMP) WITH MICROSCOPIC
Bilirubin Urine: NEGATIVE
Glucose, UA: NEGATIVE mg/dL
Hgb urine dipstick: NEGATIVE
Ketones, ur: NEGATIVE mg/dL
Nitrite: NEGATIVE
Protein, ur: NEGATIVE mg/dL
Specific Gravity, Urine: 1.014 (ref 1.005–1.030)
pH: 7 (ref 5.0–8.0)

## 2024-06-19 NOTE — Progress Notes (Signed)
 Could you please call the lab and asked them to add on a urine culture?  I have already ordered this.  If you could please let the patient know that her urine looks like there may be an infection but given her recent culture that was negative, I would like to wait to see the culture come back before we start antibiotics.

## 2024-06-19 NOTE — Progress Notes (Signed)
 Gynecologic Oncology Return Clinic Visit  06/19/24  Reason for Visit: follow-up   Treatment History: Patient initially presented with lower abdominal pain described as persistent midline cramping or aching with occasional sharp shooting pains that radiate from her lower abdomen to her sides.  Had an episode of bleeding in April that resolved. In mid-April, she was seen in the emergency department with nausea, emesis, fatigue, and abdominal pain.  CT of the abdomen and pelvis was performed at that time and was without acute abnormalities.  Specifically, 16 mm intramural fibroid within the uterus noted, no other uterine abnormalities or adnexal lesions noted.  No adenopathy. Pelvic ultrasound on 01/18/24 revealed a uterus measuring 7.6 x 5.6 x 4 cm with a thickened endometrium measuring 9.7 mm with color flow and possible polyps.  Several fibroids seen measuring up to 1.6 cm.  Right ovary surgically absent.  Left ovary normal in appearance.  No free fluid. Endometrial biopsy from 02/26/2024: Atrophic endometrium with focal stromal breakdown. Given suspected polyp based on ultrasound findings, patient underwent hysteroscopy with endometrial polypectomy and myomectomy on 7/25.  Findings at the time of surgery included an endometrial polyp arising from the posterior aspect of the lower uterine segment superior to a submucosal myoma. 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   History is notable for an ASC-H Pap in 2022, negative for HPV at that time.  She underwent colposcopy with cervical biopsy and ECC.  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  Follow-up Pap test in 2023 and 2024 both [facility].  Office notes report negative Pap in 02/2024 (patient confirms this today).   05/28/24: Robotic-assisted laparoscopic total hysterectomy with left salpingo-oophorectomy, SLN mapping   Interval  History: Feels that recovery is going slower than she had hoped.  Thinks she has been overdoing it at home.  Has intermittent sharp piercing pain in her left lateral abdomen that shoots down to her pelvis.  Endorsing having 1-2 regular bowel movements daily.  Has had some low back pain.  Having trouble sleeping on her side.  Denies any vaginal bleeding.  Was given antibiotic by her primary care provider but then told to stop taking it when her urine did not show any infection.  Notes that her urine has since become more cloudy.  Past Medical/Surgical History: Past Medical History:  Diagnosis Date   Anemia    Anxiety    Asthma    inhaler - uses twice monthly   Atypical chest pain 05/21/2018   Benign tumor of bladder    Diabetes (HCC)    Dizziness 11/24/2018   Essential hypertension, benign 03/03/2013   Fatigue 10/23/2019   Fibroma of heart    Fibromyalgia    Hepatitis B immune 08/01/2019   History of DVT (deep vein thrombosis) 02/06/2018   left leh from injury   History of iron deficiency anemia 05/21/2018   LLQ pain 05/12/2019   US  Showeed absent right ovary Unable to see Left ovary Fibroid uterus (2 small ones) with IUD low in Uterus FSH showed pt is in menopause May take IUD out if desires   Low back pain radiating to right leg 02/17/2018   Menorrhagia 05/20/2013   Controlled with Mirena .  Probable submucosal fibroid.    Migraines    Mixed hyperlipidemia 05/21/2018   Obesity 03/03/2013   OSA (obstructive sleep apnea) 11/23/2019   uses CPAP   Pedal edema 02/06/2018   PTSD (post-traumatic stress disorder)  Rheumatoid arthritis (HCC) 10/23/2019   Right knee injury, subsequent encounter 01/24/2018   Right wrist injury, subsequent encounter 01/24/2018   Sprain of MCL (medial collateral ligament) of knee 12/12/2016   Sprain of right ankle 12/12/2016    Past Surgical History:  Procedure Laterality Date   APPENDECTOMY  2024   BACK SURGERY  1993   l4 and l5   bladder tack   1988   DILATION AND CURETTAGE OF UTERUS     ECTOPIC PREGNANCY SURGERY Right 1988   right tube and ovary removed   HEART TUMOR EXCISION  1997   HYSTEROSCOPY,RMV MYOMA  HYSTEROSCOPY WITH DILATATION AND CURETTAGE WITH MYOSURE POLYPECTOMY   05/01/2024   INJECTION, FOR SENTINEL LYMPH NODE IDENTIFICATION N/A 05/28/2024   Procedure: ICG INJECTION;  Surgeon: Viktoria Comer SAUNDERS, MD;  Location: [facility];  Service: Gynecology;  Laterality: N/A;   ROBOTIC ASSISTED TOTAL HYSTERECTOMY WITH BILATERAL SALPINGO OOPHERECTOMY Left 05/28/2024   Procedure: HYSTERECTOMY, TOTAL, ROBOT-ASSISTED, LAPAROSCOPIC, WITH LEFT SALPINGO-OOPHORECTOMY;  Surgeon: Viktoria Comer SAUNDERS, MD;  Location: [facility];  Service: Gynecology;  Laterality: Left;  POSSIBLE LAPAROTOMY   TONSILLECTOMY      Family History  Problem Relation Age of Onset   Breast cancer Mother    Hypertension Mother    Hyperlipidemia Mother    Colon cancer Father    Throat cancer Father    Prostate cancer Father    Thyroid  disease Sister    Breast cancer Sister    Thyroid  disease Sister    Heart failure Maternal Grandmother    Prostate cancer Maternal Grandfather    Cancer Maternal Grandfather        prostate   Ovarian cancer Paternal Grandmother    Colon cancer Paternal Grandfather    Hypertension Maternal Aunt    Heart failure Maternal Aunt    Cancer Maternal Aunt    Stomach cancer Maternal Aunt    Hypertension Maternal Aunt    Ovarian cancer Maternal Aunt    Hypertension Maternal Uncle    Diabetes Maternal Uncle    Cancer Other    Brain cancer Paternal Aunt    Endometrial cancer Neg Hx    Pancreatic cancer Neg Hx     Social History   Socioeconomic History   Marital status: Unknown    Spouse name: Not on file   Number of children: 3   Years of education: 14   Highest education level: Not on file  Occupational History   Occupation: Diplomatic Services operational officer  Tobacco Use   Smoking status: Never   Smokeless tobacco: Never  Vaping Use   Vaping status:  Never Used  Substance and Sexual Activity   Alcohol use: No   Drug use: No   Sexual activity: Yes    Partners: Male    Birth control/protection: Post-menopausal  Other Topics Concern   Not on file  Social History Narrative   Not on file   Social Drivers of Health   Financial Resource Strain: Medium Risk (04/06/2022)   Received from [facility]   Overall Financial Resource Strain (CARDIA)    Difficulty of Paying Living Expenses: Somewhat hard  Food Insecurity: No Food Insecurity (05/29/2024)   Hunger Vital Sign    Worried About Running Out of Food in the Last Year: Never true    Ran Out of Food in the Last Year: Never true  Transportation Needs: No Transportation Needs (05/29/2024)   PRAPARE - Transportation    Lack of Transportation (Medical): No    Lack of Transportation (Non-Medical):  No  Physical Activity: Sufficiently Active (04/06/2022)   Received from [facility]   Exercise Vital Sign    On average, how many days per week do you engage in moderate to strenuous exercise (like a brisk walk)?: 3 days    On average, how many minutes do you engage in exercise at this level?: 50 min  Stress: Stress Concern Present (04/06/2022)   Received from [facility] - Occupational Stress Questionnaire    Feeling of Stress : To some extent  Social Connections: Moderately Integrated (04/06/2022)   Received from [facility]   Social Connection and Isolation Panel    In a typical week, how many times do you talk on the phone with family, friends, or neighbors?: More than three times a week    How often do you get together with friends or relatives?: Once a week    How often do you attend church or religious services?: More than 4 times per year    Do you belong to any clubs or organizations such as church groups, unions, fraternal or athletic groups, or school groups?: Yes    How often do you attend meetings of the clubs or organizations you  belong to?: 1 to 4 times per year    Are you married, widowed, divorced, separated, never married, or living with a partner?: Separated    Current Medications:  Current Outpatient Medications:    albuterol  (PROVENTIL ) (2.5 MG/3ML) 0.083% nebulizer solution, Inhale 2.5 mg into the lungs every 6 (six) hours as needed for wheezing or shortness of breath., Disp: , Rfl:    albuterol  (VENTOLIN  HFA) 108 (90 Base) MCG/ACT inhaler, Inhale 2 puffs into the lungs every 6 (six) hours as needed for wheezing or shortness of breath., Disp: , Rfl:    Ascorbic Acid (VITAMIN C PO), Take 1 tablet by mouth daily. OTC vitamin C dissolvable tablet (Patient not taking: Reported on 05/18/2024), Disp: , Rfl:    ASHWAGANDHA GUMMIES PO, Take 1 each by mouth daily. (Patient not taking: Reported on 05/18/2024), Disp: , Rfl:    azelastine (ASTELIN) 0.1 % nasal spray, Place 1 spray into both nostrils 2 (two) times daily as needed for allergies., Disp: , Rfl:    b complex vitamins capsule, Take 1 capsule by mouth daily. (Patient not taking: No sig reported), Disp: , Rfl:    Cholecalciferol (VITAMIN D3) 250 MCG (10000 UT) capsule, Take 10,000 Units by mouth daily. (Patient not taking: Reported on 05/18/2024), Disp: , Rfl:    Cyanocobalamin (VITAMIN B-12 PO), Take 1 tablet by mouth daily. (Patient not taking: Reported on 05/18/2024), Disp: , Rfl:    DULoxetine  (CYMBALTA ) 30 MG capsule, Take 30 mg by mouth daily as needed (anxiety)., Disp: , Rfl:    ECHINACEA PO, Take 1 tablet by mouth daily. (Patient not taking: Reported on 05/18/2024), Disp: , Rfl:    ELDERBERRY PO, Take 1 capsule by mouth daily. (Patient not taking: Reported on 05/18/2024), Disp: , Rfl:    Flaxseed, Linseed, (FLAXSEED OIL PO), Take 1 capsule by mouth daily as needed (gut health). (Patient not taking: Reported on 05/18/2024), Disp: , Rfl:    Ginger, Zingiber officinalis, (GINGER PO), Take 1 tablet by mouth daily. (Patient not taking: Reported on 05/18/2024), Disp: , Rfl:     GINSENG PO, Take 1 tablet by mouth daily. (Patient not taking: Reported on 05/18/2024), Disp: , Rfl:    hydrOXYzine  (VISTARIL ) 25 MG capsule, Take 25 mg by mouth every  8 (eight) hours as needed for anxiety., Disp: , Rfl:    ibuprofen  (ADVIL ) 800 MG tablet, Take 1 tablet (800 mg total) by mouth every 8 (eight) hours as needed (pain). (Patient not taking: Reported on 05/18/2024), Disp: 30 tablet, Rfl: 0   linaclotide  (LINZESS ) 290 MCG CAPS capsule, Take 290 mcg by mouth daily as needed (constipation)., Disp: , Rfl:    MAGNESIUM  PO, Take 1 tablet by mouth daily as needed (cramping, hair thinning). (Patient not taking: Reported on 05/18/2024), Disp: , Rfl:    metoprolol  succinate (TOPROL -XL) 50 MG 24 hr tablet, Take 50 mg by mouth daily., Disp: , Rfl:    Misc Natural Products (TURMERIC, CURCUMIN, PO), Take 1 tablet by mouth daily. (Patient not taking: Reported on 05/18/2024), Disp: , Rfl:    Multiple Vitamins-Minerals (MULTIVITAMIN WOMEN 50+) TABS, Take 1 tablet by mouth daily. (Patient not taking: Reported on 05/18/2024), Disp: , Rfl:    Multiple Vitamins-Minerals (ZINC PO), Take 1 tablet by mouth daily as needed (immune support). (Patient not taking: Reported on 05/18/2024), Disp: , Rfl:    Omega-3 Fatty Acids (FISH OIL PO), Take 2 capsules by mouth daily. (Patient not taking: Reported on 05/18/2024), Disp: , Rfl:    ondansetron  (ZOFRAN -ODT) 4 MG disintegrating tablet, Take 1 tablet (4 mg total) by mouth every 8 (eight) hours as needed for nausea or vomiting., Disp: 20 tablet, Rfl: 0   OVER THE COUNTER MEDICATION, Take 1 capsule by mouth daily. Hemp Oil (Patient not taking: Reported on 05/18/2024), Disp: , Rfl:    OVER THE COUNTER MEDICATION, Take 1 capsule by mouth daily. Seaweed (Patient not taking: Reported on 05/18/2024), Disp: , Rfl:    oxyCODONE  (OXY IR/ROXICODONE ) 5 MG immediate release tablet, Take 1 tablet (5 mg total) by mouth every 4 (four) hours as needed for severe pain (pain score 7-10). For  AFTER surgery only, do not take and drive, Disp: 10 tablet, Rfl: 0   pregabalin  (LYRICA ) 50 MG capsule, Take 50 mg by mouth 2 (two) times daily., Disp: , Rfl:    rizatriptan  (MAXALT -MLT) 5 MG disintegrating tablet, Take 5 mg by mouth daily as needed for migraine., Disp: , Rfl:    spironolactone  (ALDACTONE ) 25 MG tablet, Take 25 mg by mouth daily as needed (excessive fluid)., Disp: , Rfl:    tirzepatide (ZEPBOUND) 10 MG/0.5ML Pen, Inject 10 mg into the skin every Tuesday. (Patient not taking: Reported on 05/18/2024), Disp: , Rfl:    tiZANidine (ZANAFLEX) 4 MG capsule, Take 4 mg by mouth 3 (three) times daily as needed for muscle spasms., Disp: , Rfl:    topiramate  (TOPAMAX ) 100 MG tablet, Take 100 mg by mouth 2 (two) times daily., Disp: , Rfl:    traZODone  (DESYREL ) 100 MG tablet, Take 100 mg by mouth at bedtime as needed for sleep., Disp: , Rfl:    VITAMIN A PO, Take 1 capsule by mouth daily. (Patient not taking: Reported on 05/18/2024), Disp: , Rfl:    VITAMIN E PO, Take 1 capsule by mouth daily. (Patient not taking: Reported on 05/18/2024), Disp: , Rfl:   Review of Systems: Denies appetite changes, fevers, chills, unexplained weight changes. Denies hearing loss, neck lumps or masses, mouth sores, ringing in ears or voice changes. Denies cough or wheezing.  Denies shortness of breath. Denies chest pain or palpitations. Denies leg swelling. Denies abdominal distention, blood in stools, constipation, diarrhea, nausea, vomiting, or early satiety. Denies pain with intercourse, dysuria, frequency, hematuria or incontinence. Denies hot flashes, vaginal bleeding or  vaginal discharge.   Denies joint pain or muscle pain/cramps. Denies itching, rash, or wounds. Denies dizziness, headaches, numbness or seizures. Denies swollen lymph nodes or glands, denies easy bruising or bleeding. Denies anxiety, depression, confusion, or decreased concentration.  Physical Exam: BP 124/82 (BP Location: Left Arm)    Pulse 66   Temp 98.5 F (36.9 C) (Oral)   Resp 19   Wt (P) 208 lb (94.3 kg)   LMP  (LMP Unknown)   BMI (P) 35.70 kg/m  General: Alert, oriented, no acute distress. HEENT: Posterior oropharynx clear, sclera anicteric. Chest: Unlabored breathing on room air. Abdomen: soft, nontender.  Normoactive bowel sounds.  No masses or hepatosplenomegaly appreciated.  Well-healed incisions. Extremities: Grossly normal range of motion.  Warm, well perfused.  No edema bilaterally. GU: Normal appearing external genitalia without erythema, excoriation, or lesions.  Speculum exam reveals cuff intact, suture visible, no bleeding or discharge.  Bimanual exam reveals intact, no significant tenderness to palpation, no fluctuance.    Laboratory & Radiologic Studies: A. UTERUS, CERVIX, LEFT TUBE AND OVARY, RESECTION:  - Cervix with Nabothian cysts.  - Inactive endometrium, see comment.  - Myometrium with adenomyosis and leiomyomata, largest measuring 1.3 cm.  - Ovary with cortical inclusion cysts.  - Fallopian tube with fimbriated end.  - Negative for atypia and malignancy.   Comment:  Per [facility] an endometrial polyp was recently removed that contained focal  endometrioid intraepithelial neoplasia (EIN). The endometrium is  entirely submitted for microscopic examination and residual endometrial  polyp / EIN are not identified.   Assessment & Plan: Destiny Henderson is a 63 y.o. 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    Patient is overall healing well although having more pain than I would expect at this point.  She endorses overdoing things at home and being quite active.  I suggested that she decrease her activity for the next several days to see if this helps with the abdominal pain that she is having.  One of my nurses will check in with her early next week.  If pain has not improved, then we will discuss need for imaging.  Given her cloudy urine, plan to send urinalysis today.   Will add culture if any concern for urinary tract infection.  Patient was given a copy of her pathology report.  We reviewed details again from surgery that showed no residual EIN.  When she is healed from surgery, she can return to her other healthcare providers.  24 minutes of total time was spent for this patient encounter, including preparation, face-to-face counseling with the patient and coordination of care, and documentation of the encounter.  [name], MD  Division of Gynecologic Oncology  Department of Obstetrics and Gynecology  [facility]

## 2024-06-19 NOTE — Patient Instructions (Signed)
 It was great to see you.  You are healing well from surgery.  Please remember no heavy lifting until 6 weeks after surgery and nothing in the vagina for at least 12 weeks.  My nurses will call you early next week.  Please try to do a little bit less from a physical activity standpoint over the next couple of days.  If you do not notice a difference in how you are feeling, we will discuss possibly getting a CT scan.

## 2024-06-20 LAB — URINE CULTURE: Culture: <10000 — AB

## 2024-06-22 NOTE — Telephone Encounter (Signed)
 Spoke with Destiny Henderson and relayed results of urine culture for insignificant growth.  Pt verbalized understanding and states her urinary symptoms have resolved. And patient also reports that she is feeling better and only taking ibuprofen  as needed for discomfort. Pt reminded of her follow up phone visit with Dr. Viktoria on October 1st at 6 pm.  Pt thanked the office for calling.

## 2024-06-22 NOTE — Telephone Encounter (Signed)
-----   Message from Comer JONELLE Dollar sent at 06/19/2024 10:11 AM EDT ----- Could you please call the lab and asked them to add on a urine culture?  I have already ordered this.  If you could please let the patient know that her urine looks like there may be an infection but  given her recent culture that was negative, I would like to wait to see the culture come back before we start antibiotics. ----- Message ----- From: Interface, Lab In [place] Sent: 06/19/2024   9:51 AM EDT To: Comer JONELLE Dollar, MD

## 2024-06-22 NOTE — Telephone Encounter (Signed)
 Attempted to reach Destiny Henderson in regards to urine culture results. Left voicemail requesting call back.

## 2024-06-24 NOTE — Telephone Encounter (Signed)
 Entered in error

## 2024-07-07 NOTE — Progress Notes (Signed)
 Chief Complaint  Patient presents with  . Follow-up    medication    History of Present Illness:    Pleasant 63 y.o. female presents today for follow-up.  She feels like she has lost some weight.  She had her surgery in August and will do her last visit with the surgeon tomorrow.  She is hoping to be released, she is been on nearly 6 weeks.  She is not in any significant pain and has been able to be active.  She has been with her therapist now for 3 months and has thinking her psychiatrist will likely be back in November.  She is doing well on Zepbound 10 mg.  She is interested in losing more weight and would like to increase up to the next dose in time.  She would also like to get her vaccines today.   Review of Systems All other pertinent positive and negative aspects of the ROS are detailed in the above HPI    Objective:     BP 132/87 (BP Location: Left arm, Patient Position: Sitting)   Pulse 64   Ht 1.626 m (5' 4)   Wt 92.3 kg (203 lb 6.4 oz)   SpO2 100%   BMI 34.91 kg/m    Physical Exam:  General: Alert, oriented, no acute distress  ENT: Normal conjunctive a, normal hearing  Neck: Trachea midline without JVD  Cardiac: Regular rate and rhythm  Respiratory: Respirations regular and nonlabored  Integumentary: Clean dry, intact, no pallor or diaphoresis  Musculoskeletal: Normal gait, no gross deformity  Neuro: Alert and oriented x4  Psychological: Cooperative, appropriate mood and affect        Assessment & Plan Morbid obesity (CMD) Increase dose of Zepbound from 10-12.5 and follow-up in 3 months.  She is down 14 pounds. Orders: .  tirzepatide, weight loss, (Zepbound) 12.5 mg/0.5 mL subcutaneous pen injector; Inject 0.5 mL (12.5 mg total) under the skin every 7 days.  Prediabetes  Orders: .  tirzepatide, weight loss, (Zepbound) 12.5 mg/0.5 mL subcutaneous pen injector; Inject 0.5 mL (12.5 mg total) under the skin every 7 days.                 Comment: Please note this report has been produced using speech recognition software and may contain errors related to that system including errors in grammar, punctuation, and spelling, as well as words and phrases that may be inappropriate. If there are any questions or concerns please feel free to contact the dictating provider for clarification.

## 2024-07-08 ENCOUNTER — Encounter: Payer: Self-pay | Admitting: Gynecologic Oncology

## 2024-07-08 ENCOUNTER — Ambulatory Visit

## 2024-07-08 ENCOUNTER — Inpatient Hospital Stay: Attending: Gynecologic Oncology | Admitting: Gynecologic Oncology

## 2024-07-08 DIAGNOSIS — Z9079 Acquired absence of other genital organ(s): Secondary | ICD-10-CM

## 2024-07-08 DIAGNOSIS — N8502 Endometrial intraepithelial neoplasia [EIN]: Secondary | ICD-10-CM

## 2024-07-08 DIAGNOSIS — Z90710 Acquired absence of both cervix and uterus: Secondary | ICD-10-CM

## 2024-07-08 DIAGNOSIS — Z90722 Acquired absence of ovaries, bilateral: Secondary | ICD-10-CM

## 2024-07-08 DIAGNOSIS — R1032 Left lower quadrant pain: Secondary | ICD-10-CM

## 2024-07-08 NOTE — Progress Notes (Signed)
 Gynecologic Oncology Telehealth Note: Gyn-Onc  I connected with Destiny Henderson on 07/08/24 at  6:00 PM EDT by telephone and verified that I am speaking with the correct person using two identifiers.  I discussed the limitations, risks, security and privacy concerns of performing an evaluation and management service by telemedicine and the availability of in-person appointments. I also discussed with the patient that there may be a patient responsible charge related to this service. The patient expressed understanding and agreed to proceed.  Other persons participating in the visit and their role in the encounter: none.  Patient's location: [place] Provider's location: [facility], [place]  Reason for Visit: follow-up  Treatment History: Patient initially presented with lower abdominal pain described as persistent midline cramping or aching with occasional sharp shooting pains that radiate from her lower abdomen to her sides.  Had an episode of bleeding in April that resolved. In mid-April, she was seen in the emergency department with nausea, emesis, fatigue, and abdominal pain.  CT of the abdomen and pelvis was performed at that time and was without acute abnormalities.  Specifically, 16 mm intramural fibroid within the uterus noted, no other uterine abnormalities or adnexal lesions noted.  No adenopathy. Pelvic ultrasound on 01/18/24 revealed a uterus measuring 7.6 x 5.6 x 4 cm with a thickened endometrium measuring 9.7 mm with color flow and possible polyps.  Several fibroids seen measuring up to 1.6 cm.  Right ovary surgically absent.  Left ovary normal in appearance.  No free fluid. Endometrial biopsy from 02/26/2024: Atrophic endometrium with focal stromal breakdown. Given suspected polyp based on ultrasound findings, patient underwent hysteroscopy with endometrial polypectomy and myomectomy on 7/25.  Findings at the time of surgery included an endometrial polyp arising from the posterior aspect of the  lower uterine segment superior to a submucosal myoma. 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   History is notable for an ASC-H Pap in 2022, negative for HPV at that time.  She underwent colposcopy with cervical biopsy and ECC.  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  Follow-up Pap test in 2023 and 2024 both [facility].  Office notes report negative Pap in 02/2024 (patient confirms this today).   05/28/24: Robotic-assisted laparoscopic total hysterectomy with left salpingo-oophorectomy, SLN mapping    Interval History: Doing well. Some soreness still on left when turning. Bowels moving daily. Denies urinary symptoms.  Past Medical/Surgical History: Past Medical History:  Diagnosis Date   Anemia    Anxiety    Asthma    inhaler - uses twice monthly   Atypical chest pain 05/21/2018   Benign tumor of bladder    Diabetes (HCC)    Dizziness 11/24/2018   Essential hypertension, benign 03/03/2013   Fatigue 10/23/2019   Fibroma of heart    Fibromyalgia    Hepatitis B immune 08/01/2019   History of DVT (deep vein thrombosis) 02/06/2018   left leh from injury   History of iron deficiency anemia 05/21/2018   LLQ pain 05/12/2019   US  Showeed absent right ovary Unable to see Left ovary Fibroid uterus (2 small ones) with IUD low in Uterus FSH showed pt is in menopause May take IUD out if desires   Low back pain radiating to right leg 02/17/2018   Menorrhagia 05/20/2013   Controlled with Mirena .  Probable submucosal fibroid.    Migraines    Mixed hyperlipidemia 05/21/2018   Obesity 03/03/2013   OSA (obstructive  sleep apnea) 11/23/2019   uses CPAP   Pedal edema 02/06/2018   PTSD (post-traumatic stress disorder)    Rheumatoid arthritis (HCC) 10/23/2019   Right knee injury, subsequent encounter 01/24/2018   Right wrist injury, subsequent encounter 01/24/2018   Sprain of MCL  (medial collateral ligament) of knee 12/12/2016   Sprain of right ankle 12/12/2016    Past Surgical History:  Procedure Laterality Date   APPENDECTOMY  2024   BACK SURGERY  1993   l4 and l5   bladder tack  1988   DILATION AND CURETTAGE OF UTERUS     ECTOPIC PREGNANCY SURGERY Right 1988   right tube and ovary removed   HEART TUMOR EXCISION  1997   HYSTEROSCOPY,RMV MYOMA  HYSTEROSCOPY WITH DILATATION AND CURETTAGE WITH MYOSURE POLYPECTOMY   05/01/2024   INJECTION, FOR SENTINEL LYMPH NODE IDENTIFICATION N/A 05/28/2024   Procedure: ICG INJECTION;  Surgeon: Viktoria Comer SAUNDERS, MD;  Location: [facility];  Service: Gynecology;  Laterality: N/A;   ROBOTIC ASSISTED TOTAL HYSTERECTOMY WITH BILATERAL SALPINGO OOPHERECTOMY Left 05/28/2024   Procedure: HYSTERECTOMY, TOTAL, ROBOT-ASSISTED, LAPAROSCOPIC, WITH LEFT SALPINGO-OOPHORECTOMY;  Surgeon: Viktoria Comer SAUNDERS, MD;  Location: [facility];  Service: Gynecology;  Laterality: Left;  POSSIBLE LAPAROTOMY   TONSILLECTOMY      Family History  Problem Relation Age of Onset   Breast cancer Mother    Hypertension Mother    Hyperlipidemia Mother    Colon cancer Father    Throat cancer Father    Prostate cancer Father    Thyroid  disease Sister    Breast cancer Sister    Thyroid  disease Sister    Heart failure Maternal Grandmother    Prostate cancer Maternal Grandfather    Cancer Maternal Grandfather        prostate   Ovarian cancer Paternal Grandmother    Colon cancer Paternal Grandfather    Hypertension Maternal Aunt    Heart failure Maternal Aunt    Cancer Maternal Aunt    Stomach cancer Maternal Aunt    Hypertension Maternal Aunt    Ovarian cancer Maternal Aunt    Hypertension Maternal Uncle    Diabetes Maternal Uncle    Cancer Other    Brain cancer Paternal Aunt    Endometrial cancer Neg Hx    Pancreatic cancer Neg Hx     Social History   Socioeconomic History   Marital status: Unknown    Spouse name: Not on file   Number of children:  3   Years of education: 14   Highest education level: Not on file  Occupational History   Occupation: Diplomatic Services operational officer  Tobacco Use   Smoking status: Never   Smokeless tobacco: Never  Vaping Use   Vaping status: Never Used  Substance and Sexual Activity   Alcohol use: No   Drug use: No   Sexual activity: Yes    Partners: Male    Birth control/protection: Post-menopausal  Other Topics Concern   Not on file  Social History Narrative   Not on file   Social Drivers of Health   Financial Resource Strain: Medium Risk (04/06/2022)   Received from [facility]   Overall Financial Resource Strain (CARDIA)    Difficulty of Paying Living Expenses: Somewhat hard  Food Insecurity: No Food Insecurity (05/29/2024)   Hunger Vital Sign    Worried About Running Out of Food in the Last Year: Never true    Ran Out of Food in the Last Year: Never true  Transportation Needs: No Transportation Needs (  05/29/2024)   PRAPARE - Administrator, Civil Service (Medical): No    Lack of Transportation (Non-Medical): No  Physical Activity: Sufficiently Active (04/06/2022)   Received from [facility]   Exercise Vital Sign    On average, how many days per week do you engage in moderate to strenuous exercise (like a brisk walk)?: 3 days    On average, how many minutes do you engage in exercise at this level?: 50 min  Stress: Stress Concern Present (04/06/2022)   Received from [facility] - Occupational Stress Questionnaire    Feeling of Stress : To some extent  Social Connections: Moderately Integrated (04/06/2022)   Received from [facility]   Social Connection and Isolation Panel    In a typical week, how many times do you talk on the phone with family, friends, or neighbors?: More than three times a week    How often do you get together with friends or relatives?: Once a week    How often do you attend church or religious services?: More than 4 times  per year    Do you belong to any clubs or organizations such as church groups, unions, fraternal or athletic groups, or school groups?: Yes    How often do you attend meetings of the clubs or organizations you belong to?: 1 to 4 times per year    Are you married, widowed, divorced, separated, never married, or living with a partner?: Separated    Current Medications:  Current Outpatient Medications:    albuterol  (PROVENTIL ) (2.5 MG/3ML) 0.083% nebulizer solution, Inhale 2.5 mg into the lungs every 6 (six) hours as needed for wheezing or shortness of breath., Disp: , Rfl:    albuterol  (VENTOLIN  HFA) 108 (90 Base) MCG/ACT inhaler, Inhale 2 puffs into the lungs every 6 (six) hours as needed for wheezing or shortness of breath., Disp: , Rfl:    Ascorbic Acid (VITAMIN C PO), Take 1 tablet by mouth daily. OTC vitamin C dissolvable tablet (Patient not taking: Reported on 05/18/2024), Disp: , Rfl:    ASHWAGANDHA GUMMIES PO, Take 1 each by mouth daily. (Patient not taking: Reported on 05/18/2024), Disp: , Rfl:    azelastine (ASTELIN) 0.1 % nasal spray, Place 1 spray into both nostrils 2 (two) times daily as needed for allergies., Disp: , Rfl:    b complex vitamins capsule, Take 1 capsule by mouth daily. (Patient not taking: No sig reported), Disp: , Rfl:    Cholecalciferol (VITAMIN D3) 250 MCG (10000 UT) capsule, Take 10,000 Units by mouth daily. (Patient not taking: Reported on 05/18/2024), Disp: , Rfl:    Cyanocobalamin (VITAMIN B-12 PO), Take 1 tablet by mouth daily. (Patient not taking: Reported on 05/18/2024), Disp: , Rfl:    DULoxetine  (CYMBALTA ) 30 MG capsule, Take 30 mg by mouth daily as needed (anxiety)., Disp: , Rfl:    ECHINACEA PO, Take 1 tablet by mouth daily. (Patient not taking: Reported on 05/18/2024), Disp: , Rfl:    ELDERBERRY PO, Take 1 capsule by mouth daily. (Patient not taking: Reported on 05/18/2024), Disp: , Rfl:    Flaxseed, Linseed, (FLAXSEED OIL PO), Take 1 capsule by mouth daily as  needed (gut health). (Patient not taking: Reported on 05/18/2024), Disp: , Rfl:    Ginger, Zingiber officinalis, (GINGER PO), Take 1 tablet by mouth daily. (Patient not taking: Reported on 05/18/2024), Disp: , Rfl:    GINSENG PO, Take 1 tablet by mouth daily. (Patient not  taking: Reported on 05/18/2024), Disp: , Rfl:    hydrOXYzine  (VISTARIL ) 25 MG capsule, Take 25 mg by mouth every 8 (eight) hours as needed for anxiety., Disp: , Rfl:    ibuprofen  (ADVIL ) 800 MG tablet, Take 1 tablet (800 mg total) by mouth every 8 (eight) hours as needed (pain). (Patient not taking: Reported on 05/18/2024), Disp: 30 tablet, Rfl: 0   linaclotide  (LINZESS ) 290 MCG CAPS capsule, Take 290 mcg by mouth daily as needed (constipation)., Disp: , Rfl:    MAGNESIUM  PO, Take 1 tablet by mouth daily as needed (cramping, hair thinning). (Patient not taking: Reported on 05/18/2024), Disp: , Rfl:    metoprolol  succinate (TOPROL -XL) 50 MG 24 hr tablet, Take 50 mg by mouth daily., Disp: , Rfl:    Misc Natural Products (TURMERIC, CURCUMIN, PO), Take 1 tablet by mouth daily. (Patient not taking: Reported on 05/18/2024), Disp: , Rfl:    Multiple Vitamins-Minerals (MULTIVITAMIN WOMEN 50+) TABS, Take 1 tablet by mouth daily. (Patient not taking: Reported on 05/18/2024), Disp: , Rfl:    Multiple Vitamins-Minerals (ZINC PO), Take 1 tablet by mouth daily as needed (immune support). (Patient not taking: Reported on 05/18/2024), Disp: , Rfl:    Omega-3 Fatty Acids (FISH OIL PO), Take 2 capsules by mouth daily. (Patient not taking: Reported on 05/18/2024), Disp: , Rfl:    ondansetron  (ZOFRAN -ODT) 4 MG disintegrating tablet, Take 1 tablet (4 mg total) by mouth every 8 (eight) hours as needed for nausea or vomiting., Disp: 20 tablet, Rfl: 0   OVER THE COUNTER MEDICATION, Take 1 capsule by mouth daily. Hemp Oil (Patient not taking: Reported on 05/18/2024), Disp: , Rfl:    OVER THE COUNTER MEDICATION, Take 1 capsule by mouth daily. Seaweed (Patient not  taking: Reported on 05/18/2024), Disp: , Rfl:    oxyCODONE  (OXY IR/ROXICODONE ) 5 MG immediate release tablet, Take 1 tablet (5 mg total) by mouth every 4 (four) hours as needed for severe pain (pain score 7-10). For AFTER surgery only, do not take and drive, Disp: 10 tablet, Rfl: 0   pregabalin  (LYRICA ) 50 MG capsule, Take 50 mg by mouth 2 (two) times daily., Disp: , Rfl:    rizatriptan  (MAXALT -MLT) 5 MG disintegrating tablet, Take 5 mg by mouth daily as needed for migraine., Disp: , Rfl:    spironolactone  (ALDACTONE ) 25 MG tablet, Take 25 mg by mouth daily as needed (excessive fluid)., Disp: , Rfl:    tirzepatide (ZEPBOUND) 10 MG/0.5ML Pen, Inject 10 mg into the skin every Tuesday. (Patient not taking: Reported on 05/18/2024), Disp: , Rfl:    tiZANidine (ZANAFLEX) 4 MG capsule, Take 4 mg by mouth 3 (three) times daily as needed for muscle spasms., Disp: , Rfl:    topiramate  (TOPAMAX ) 100 MG tablet, Take 100 mg by mouth 2 (two) times daily., Disp: , Rfl:    traZODone  (DESYREL ) 100 MG tablet, Take 100 mg by mouth at bedtime as needed for sleep., Disp: , Rfl:    VITAMIN A PO, Take 1 capsule by mouth daily. (Patient not taking: Reported on 05/18/2024), Disp: , Rfl:    VITAMIN E PO, Take 1 capsule by mouth daily. (Patient not taking: Reported on 05/18/2024), Disp: , Rfl:   Review of Symptoms: Pertinent positives as per HPI.  Physical Exam: Deferred given limitations of phone visit.  Laboratory & Radiologic Studies: A. UTERUS, CERVIX, LEFT TUBE AND OVARY, RESECTION:  - Cervix with Nabothian cysts.  - Inactive endometrium, see comment.  - Myometrium with adenomyosis and leiomyomata, largest  measuring 1.3 cm.  - Ovary with cortical inclusion cysts.  - Fallopian tube with fimbriated end.  - Negative for atypia and malignancy.   Comment:  Per [facility] an endometrial polyp was recently removed that contained focal  endometrioid intraepithelial neoplasia (EIN). The endometrium is  entirely submitted for  microscopic examination and residual endometrial  polyp / EIN are not identified.   Assessment & Plan: Destiny Henderson is a 63 y.o.  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   Doing well. Feeling significantly better than at her last visit with me. Plan to return to work tomorrow. I've asked my office to help ensure that she has all forms needed to return.  I discussed the assessment and treatment plan with the patient. The patient was provided with an opportunity to ask questions and all were answered. The patient agreed with the plan and demonstrated an understanding of the instructions.   The patient was advised to call back or see an in-person evaluation if the symptoms worsen or if the condition fails to improve as anticipated.   12 minutes of total time was spent for this patient encounter, including preparation, phone counseling with the patient and coordination of care, and documentation of the encounter.   [name], MD  Division of Gynecologic Oncology  Department of Obstetrics and Gynecology  [facility]

## 2024-07-10 ENCOUNTER — Encounter: Payer: Self-pay | Admitting: Gynecologic Oncology

## 2024-08-26 ENCOUNTER — Telehealth: Payer: Self-pay | Admitting: Oncology

## 2024-08-26 NOTE — Telephone Encounter (Signed)
 Destiny Henderson called and reported having sharp pains in her sides that shoot down to her vaginal area.  They started 3 days ago.  She will be sitting down and the pain comes out of nowhere and makes her jump.  She is taking Motrin  and using a heating pad without relief. She is wondering if she needs to be seen and also asked about getting an ultrasound.  She reports having a small amount of brown vaginal discharge 2 days ago but none since.  She reports her urine is not flowing like usual but could not explain why it is different.  She denies having any dysuria and has been drinking a lot of water .  She reports having constipation but this is not a new issue and takes Linzess .

## 2024-08-26 NOTE — Progress Notes (Unsigned)
 Gynecologic Oncology Symptom Management Visit  Date: 08/28/2024  Reason for Visit: evaluation of left lower quadrant pain, episode of brown spotting  Treatment History: Patient initially presented with lower abdominal pain described as persistent midline cramping or aching with occasional sharp shooting pains that radiate from her lower abdomen to her sides.  Had an episode of bleeding in April that resolved. In mid-April, she was seen in the emergency department with nausea, emesis, fatigue, and abdominal pain.  CT of the abdomen and pelvis was performed at that time and was without acute abnormalities.  Specifically, 16 mm intramural fibroid within the uterus noted, no other uterine abnormalities or adnexal lesions noted.  No adenopathy. Pelvic ultrasound on 01/18/24 revealed a uterus measuring 7.6 x 5.6 x 4 cm with a thickened endometrium measuring 9.7 mm with color flow and possible polyps.  Several fibroids seen measuring up to 1.6 cm.  Right ovary surgically absent.  Left ovary normal in appearance.  No free fluid. Endometrial biopsy from 02/26/2024: Atrophic endometrium with focal stromal breakdown. Given suspected polyp based on ultrasound findings, patient underwent hysteroscopy with endometrial polypectomy and myomectomy on 7/25.  Findings at the time of surgery included an endometrial polyp arising from the posterior aspect of the lower uterine segment superior to a submucosal myoma. 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   History is notable for an ASC-H Pap in 2022, negative for HPV at that time.  She underwent colposcopy with cervical biopsy and ECC.  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  Follow-up Pap test in 2023 and 2024 both [facility].  Office notes report negative Pap in 02/2024 (patient confirms this today).   05/28/24: Robotic-assisted laparoscopic total  hysterectomy with left salpingo-oophorectomy, SLN mapping   Last seen in person on 06/19/2024 with Dr. Viktoria and had follow up phone visit on 07/08/2024.  Interval History: Patient presents today to the office for evaluation of new left-sided abdominal pain.  She states the pain has been present for the past 4 days and is intermittent.  She describes the pain as sharp that radiates to the deep abdomen.  She has had brown vaginal discharge for 1 episode that has resolved.  Reports having chills and nausea.  She has checked her temperature with it being 99 and 100 on one occasion.  She continues to remain constipated and takes Linzess  and uses MiraLAX  as needed.  She feels her bladder flow is not as strong as it was preoperatively and she has had intermittent burning with urination.  She has not placed anything in the vagina or had recent intercourse.  She reports bilateral lower extremity edema with no pain in the calf area. She has been experiencing hair loss and hair growth in different locations on her body.   Questions answered in regards to vaginal lubrication, resuming intercourse after the 12 weeks. She has big plans for Thanksgiving with family and her fianc.  Past Medical/Surgical History: Past Medical History:  Diagnosis Date   Anemia    Anxiety    Asthma    inhaler - uses twice monthly   Atypical chest pain 05/21/2018   Benign tumor of bladder    Diabetes (HCC)    Dizziness 11/24/2018   Essential hypertension, benign 03/03/2013   Fatigue 10/23/2019   Fibroma of heart    Fibromyalgia    Hepatitis B immune 08/01/2019   History of DVT (deep vein thrombosis) 02/06/2018   left  leh from injury   History of iron deficiency anemia 05/21/2018   LLQ pain 05/12/2019   US  Showeed absent right ovary Unable to see Left ovary Fibroid uterus (2 small ones) with IUD low in Uterus [facility] showed pt is in menopause May take IUD out if desires   Low back pain radiating to right leg 02/17/2018    Menorrhagia 05/20/2013   Controlled with Mirena .  Probable submucosal fibroid.    Migraines    Mixed hyperlipidemia 05/21/2018   Obesity 03/03/2013   OSA (obstructive sleep apnea) 11/23/2019   uses CPAP   Pedal edema 02/06/2018   PTSD (post-traumatic stress disorder)    Rheumatoid arthritis (HCC) 10/23/2019   Right knee injury, subsequent encounter 01/24/2018   Right wrist injury, subsequent encounter 01/24/2018   Sprain of MCL (medial collateral ligament) of knee 12/12/2016   Sprain of right ankle 12/12/2016    Past Surgical History:  Procedure Laterality Date   APPENDECTOMY  2024   BACK SURGERY  1993   l4 and l5   bladder tack  1988   DILATION AND CURETTAGE OF UTERUS     ECTOPIC PREGNANCY SURGERY Right 1988   right tube and ovary removed   HEART TUMOR EXCISION  1997   HYSTEROSCOPY,RMV MYOMA  HYSTEROSCOPY WITH DILATATION AND CURETTAGE WITH MYOSURE POLYPECTOMY   05/01/2024   INJECTION, FOR SENTINEL LYMPH NODE IDENTIFICATION N/A 05/28/2024   Procedure: ICG INJECTION;  Surgeon: Viktoria Comer SAUNDERS, MD;  Location: [facility];  Service: Gynecology;  Laterality: N/A;   ROBOTIC ASSISTED TOTAL HYSTERECTOMY WITH BILATERAL SALPINGO OOPHERECTOMY Left 05/28/2024   Procedure: HYSTERECTOMY, TOTAL, ROBOT-ASSISTED, LAPAROSCOPIC, WITH LEFT SALPINGO-OOPHORECTOMY;  Surgeon: Viktoria Comer SAUNDERS, MD;  Location: [facility];  Service: Gynecology;  Laterality: Left;  POSSIBLE LAPAROTOMY   TONSILLECTOMY      Family History  Problem Relation Age of Onset   Breast cancer Mother    Hypertension Mother    Hyperlipidemia Mother    Colon cancer Father    Throat cancer Father    Prostate cancer Father    Thyroid  disease Sister    Breast cancer Sister    Thyroid  disease Sister    Heart failure Maternal Grandmother    Prostate cancer Maternal Grandfather    Cancer Maternal Grandfather        prostate   Ovarian cancer Paternal Grandmother    Colon cancer Paternal Grandfather    Hypertension Maternal Aunt     Heart failure Maternal Aunt    Cancer Maternal Aunt    Stomach cancer Maternal Aunt    Hypertension Maternal Aunt    Ovarian cancer Maternal Aunt    Hypertension Maternal Uncle    Diabetes Maternal Uncle    Cancer Other    Brain cancer Paternal Aunt    Endometrial cancer Neg Hx    Pancreatic cancer Neg Hx     Social History   Socioeconomic History   Marital status: Unknown    Spouse name: Not on file   Number of children: 3   Years of education: 14   Highest education level: Not on file  Occupational History   Occupation: diplomatic services operational officer  Tobacco Use   Smoking status: Never   Smokeless tobacco: Never  Vaping Use   Vaping status: Never Used  Substance and Sexual Activity   Alcohol use: No   Drug use: No   Sexual activity: Yes    Partners: Male    Birth control/protection: Post-menopausal  Other Topics Concern   Not on file  Social History Narrative   Not on file   Social Drivers of Health   Financial Resource Strain: Medium Risk (04/06/2022)   Received from [facility]   Overall Financial Resource Strain (CARDIA)    Difficulty of Paying Living Expenses: Somewhat hard  Food Insecurity: No Food Insecurity (05/29/2024)   Hunger Vital Sign    Worried About Running Out of Food in the Last Year: Never true    Ran Out of Food in the Last Year: Never true  Transportation Needs: No Transportation Needs (05/29/2024)   PRAPARE - Administrator, Civil Service (Medical): No    Lack of Transportation (Non-Medical): No  Physical Activity: Sufficiently Active (04/06/2022)   Received from [facility]   Exercise Vital Sign    On average, how many days per week do you engage in moderate to strenuous exercise (like a brisk walk)?: 3 days    On average, how many minutes do you engage in exercise at this level?: 50 min  Stress: Stress Concern Present (04/06/2022)   Received from [facility] - Occupational Stress Questionnaire     Feeling of Stress : To some extent  Social Connections: Moderately Integrated (04/06/2022)   Received from [facility]   Social Connection and Isolation Panel    In a typical week, how many times do you talk on the phone with family, friends, or neighbors?: More than three times a week    How often do you get together with friends or relatives?: Once a week    How often do you attend church or religious services?: More than 4 times per year    Do you belong to any clubs or organizations such as church groups, unions, fraternal or athletic groups, or school groups?: Yes    How often do you attend meetings of the clubs or organizations you belong to?: 1 to 4 times per year    Are you married, widowed, divorced, separated, never married, or living with a partner?: Separated    Current Medications:  Current Outpatient Medications:    albuterol  (PROVENTIL ) (2.5 MG/3ML) 0.083% nebulizer solution, Inhale 2.5 mg into the lungs every 6 (six) hours as needed for wheezing or shortness of breath., Disp: , Rfl:    albuterol  (VENTOLIN  HFA) 108 (90 Base) MCG/ACT inhaler, Inhale 2 puffs into the lungs every 6 (six) hours as needed for wheezing or shortness of breath., Disp: , Rfl:    Ascorbic Acid (VITAMIN C PO), Take 1 tablet by mouth daily. OTC vitamin C dissolvable tablet (Patient not taking: Reported on 05/18/2024), Disp: , Rfl:    ASHWAGANDHA GUMMIES PO, Take 1 each by mouth daily. (Patient not taking: Reported on 05/18/2024), Disp: , Rfl:    azelastine (ASTELIN) 0.1 % nasal spray, Place 1 spray into both nostrils 2 (two) times daily as needed for allergies., Disp: , Rfl:    b complex vitamins capsule, Take 1 capsule by mouth daily. (Patient not taking: No sig reported), Disp: , Rfl:    Cholecalciferol (VITAMIN D3) 250 MCG (10000 UT) capsule, Take 10,000 Units by mouth daily. (Patient not taking: Reported on 05/18/2024), Disp: , Rfl:    Cyanocobalamin (VITAMIN B-12 PO), Take 1 tablet by mouth daily.  (Patient not taking: Reported on 05/18/2024), Disp: , Rfl:    DULoxetine  (CYMBALTA ) 30 MG capsule, Take 30 mg by mouth daily as needed (anxiety)., Disp: , Rfl:    ECHINACEA PO, Take 1 tablet by mouth daily. (Patient  not taking: Reported on 05/18/2024), Disp: , Rfl:    ELDERBERRY PO, Take 1 capsule by mouth daily. (Patient not taking: Reported on 05/18/2024), Disp: , Rfl:    Flaxseed, Linseed, (FLAXSEED OIL PO), Take 1 capsule by mouth daily as needed (gut health). (Patient not taking: Reported on 05/18/2024), Disp: , Rfl:    Ginger, Zingiber officinalis, (GINGER PO), Take 1 tablet by mouth daily. (Patient not taking: Reported on 05/18/2024), Disp: , Rfl:    GINSENG PO, Take 1 tablet by mouth daily. (Patient not taking: Reported on 05/18/2024), Disp: , Rfl:    hydrOXYzine  (VISTARIL ) 25 MG capsule, Take 25 mg by mouth every 8 (eight) hours as needed for anxiety., Disp: , Rfl:    ibuprofen  (ADVIL ) 800 MG tablet, Take 1 tablet (800 mg total) by mouth every 8 (eight) hours as needed (pain). (Patient not taking: Reported on 05/18/2024), Disp: 30 tablet, Rfl: 0   linaclotide  (LINZESS ) 290 MCG CAPS capsule, Take 290 mcg by mouth daily as needed (constipation)., Disp: , Rfl:    MAGNESIUM  PO, Take 1 tablet by mouth daily as needed (cramping, hair thinning). (Patient not taking: Reported on 05/18/2024), Disp: , Rfl:    metoprolol  succinate (TOPROL -XL) 50 MG 24 hr tablet, Take 50 mg by mouth daily., Disp: , Rfl:    Misc Natural Products (TURMERIC, CURCUMIN, PO), Take 1 tablet by mouth daily. (Patient not taking: Reported on 05/18/2024), Disp: , Rfl:    Multiple Vitamins-Minerals (MULTIVITAMIN WOMEN 50+) TABS, Take 1 tablet by mouth daily. (Patient not taking: Reported on 05/18/2024), Disp: , Rfl:    Multiple Vitamins-Minerals (ZINC PO), Take 1 tablet by mouth daily as needed (immune support). (Patient not taking: Reported on 05/18/2024), Disp: , Rfl:    Omega-3 Fatty Acids (FISH OIL PO), Take 2 capsules by mouth daily.  (Patient not taking: Reported on 05/18/2024), Disp: , Rfl:    ondansetron  (ZOFRAN -ODT) 4 MG disintegrating tablet, Take 1 tablet (4 mg total) by mouth every 8 (eight) hours as needed for nausea or vomiting., Disp: 20 tablet, Rfl: 0   OVER THE COUNTER MEDICATION, Take 1 capsule by mouth daily. Hemp Oil (Patient not taking: Reported on 05/18/2024), Disp: , Rfl:    OVER THE COUNTER MEDICATION, Take 1 capsule by mouth daily. Seaweed (Patient not taking: Reported on 05/18/2024), Disp: , Rfl:    oxyCODONE  (OXY IR/ROXICODONE ) 5 MG immediate release tablet, Take 1 tablet (5 mg total) by mouth every 4 (four) hours as needed for severe pain (pain score 7-10). For AFTER surgery only, do not take and drive, Disp: 10 tablet, Rfl: 0   pregabalin  (LYRICA ) 50 MG capsule, Take 50 mg by mouth 2 (two) times daily., Disp: , Rfl:    rizatriptan  (MAXALT -MLT) 5 MG disintegrating tablet, Take 5 mg by mouth daily as needed for migraine., Disp: , Rfl:    spironolactone  (ALDACTONE ) 25 MG tablet, Take 25 mg by mouth daily as needed (excessive fluid)., Disp: , Rfl:    tirzepatide (ZEPBOUND) 10 MG/0.5ML Pen, Inject 10 mg into the skin every Tuesday. (Patient not taking: Reported on 05/18/2024), Disp: , Rfl:    tiZANidine (ZANAFLEX) 4 MG capsule, Take 4 mg by mouth 3 (three) times daily as needed for muscle spasms., Disp: , Rfl:    topiramate  (TOPAMAX ) 100 MG tablet, Take 100 mg by mouth 2 (two) times daily., Disp: , Rfl:    traZODone  (DESYREL ) 100 MG tablet, Take 100 mg by mouth at bedtime as needed for sleep., Disp: , Rfl:  VITAMIN A PO, Take 1 capsule by mouth daily. (Patient not taking: Reported on 05/18/2024), Disp: , Rfl:    VITAMIN E PO, Take 1 capsule by mouth daily. (Patient not taking: Reported on 05/18/2024), Disp: , Rfl:   Review of Symptoms: Pertinent positives as per HPI.  Physical Exam: General: Well developed, well nourished female in no acute distress. Alert and oriented x 3.  Neck: Supple without any  enlargements.  Lymph node survey: No cervical, supraclavicular, or inguinal adenopathy.  Cardiovascular: Regular rate and rhythm. S1 and S2 normal.  Lungs: Clear to auscultation bilaterally. No wheezes/crackles/rhonchi noted.  Skin: No rashes or lesions present. Back: No CVA tenderness.  Abdomen: Abdomen soft, non-tender and obese. Active bowel sounds in all quadrants. No evidence of a fluid wave or abdominal masses. Abdominal incisions are well healed. Genitourinary:    Vulva/vagina: Normal external female genitalia. No lesions.    Urethra: No lesions or masses    Vagina: Mildly atrophic without any lesions. No palpable masses. No vaginal bleeding or drainage noted. Speculum exam reveals cuff intact, suture visible on lateral aspects of cuff.  Bimanual exam reveals cuff intact, no fluctuance or tenderness to palpation.    Extremities: No bilateral cyanosis, pitting edema, or clubbing. Trace bilateral edema.    Laboratory & Radiologic Studies: A. UTERUS, CERVIX, LEFT TUBE AND OVARY, RESECTION:  - Cervix with Nabothian cysts.  - Inactive endometrium, see comment.  - Myometrium with adenomyosis and leiomyomata, largest measuring 1.3 cm.  - Ovary with cortical inclusion cysts.  - Fallopian tube with fimbriated end.  - Negative for atypia and malignancy.   Comment:  Per [facility] an endometrial polyp was recently removed that contained focal  endometrioid intraepithelial neoplasia (EIN). The endometrium is  entirely submitted for microscopic examination and residual endometrial  polyp / EIN are not identified.   Assessment & Plan: [place] Caroli KYLIYAH Henderson is a 63 y.o.  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 She presents today to the office for left sided abdominal pain. No abnormal findings on examination today. Urine sample obtained and she will be contacted with the results. Given pain with intermittent chills, nausea, plan for a CT scan to further evaluate. Will obtain lab  work today to assess kidney function prior to CT scan and to assess for an elevated Destiny count.  We discussed several options for vaginal lubrication and samples given. Post-operative restrictions reviewed. She will be contacted with the results of the above testing. If no acute findings related to recent surgery, will recommend follow up with her PCP. Advised to continue on bowel regimen to manage chronic constipation.    20 minutes of total time was spent for this patient encounter, including preparation, counseling with the patient and coordination of care, and documentation of the encounter.  Eleanor Epps NP [facility] Oncology

## 2024-08-26 NOTE — Telephone Encounter (Signed)
 Called Destiny Henderson back and she has has chills recently.  She denies having a fever.  She denies having any recent sexual activity.  Offered her an appointment with Eleanor Epps, NP on Friday, 08/28/24 at 8:30 and she would like to take it.  Appointment scheduled.

## 2024-08-27 NOTE — Telephone Encounter (Signed)
 Destiny Henderson called this morning and asked if there are any appointments available today to be seen.  Advised her that the providers are in the OR today.  She said she wants to keep the appointment tomorrow.  Asked if her symptoms were work and she said they are the same.  Advised her to go to the ER if they worsen before tomorrow morning.

## 2024-08-28 ENCOUNTER — Ambulatory Visit: Payer: Self-pay | Admitting: Gynecologic Oncology

## 2024-08-28 ENCOUNTER — Inpatient Hospital Stay

## 2024-08-28 ENCOUNTER — Inpatient Hospital Stay: Attending: Gynecologic Oncology | Admitting: Gynecologic Oncology

## 2024-08-28 ENCOUNTER — Ambulatory Visit (HOSPITAL_BASED_OUTPATIENT_CLINIC_OR_DEPARTMENT_OTHER)
Admission: RE | Admit: 2024-08-28 | Discharge: 2024-08-28 | Disposition: A | Discharge status: Home / Self Care | Source: Ambulatory Visit | Attending: Gynecologic Oncology | Admitting: Gynecologic Oncology

## 2024-08-28 ENCOUNTER — Other Ambulatory Visit: Payer: Self-pay

## 2024-08-28 VITALS — BP 142/88 | HR 67 | Temp 98.3°F | Resp 19 | Wt 200.6 lb

## 2024-08-28 DIAGNOSIS — R600 Localized edema: Secondary | ICD-10-CM | POA: Insufficient documentation

## 2024-08-28 DIAGNOSIS — Z8349 Family history of other endocrine, nutritional and metabolic diseases: Secondary | ICD-10-CM | POA: Insufficient documentation

## 2024-08-28 DIAGNOSIS — N938 Other specified abnormal uterine and vaginal bleeding: Secondary | ICD-10-CM | POA: Insufficient documentation

## 2024-08-28 DIAGNOSIS — R109 Unspecified abdominal pain: Secondary | ICD-10-CM

## 2024-08-28 DIAGNOSIS — N8502 Endometrial intraepithelial neoplasia [EIN]: Secondary | ICD-10-CM | POA: Insufficient documentation

## 2024-08-28 DIAGNOSIS — Z90710 Acquired absence of both cervix and uterus: Secondary | ICD-10-CM | POA: Insufficient documentation

## 2024-08-28 DIAGNOSIS — R300 Dysuria: Secondary | ICD-10-CM

## 2024-08-28 DIAGNOSIS — I10 Essential (primary) hypertension: Secondary | ICD-10-CM | POA: Insufficient documentation

## 2024-08-28 DIAGNOSIS — Z79899 Other long term (current) drug therapy: Secondary | ICD-10-CM | POA: Insufficient documentation

## 2024-08-28 DIAGNOSIS — R1084 Generalized abdominal pain: Secondary | ICD-10-CM

## 2024-08-28 DIAGNOSIS — Z86718 Personal history of other venous thrombosis and embolism: Secondary | ICD-10-CM | POA: Insufficient documentation

## 2024-08-28 DIAGNOSIS — Z8249 Family history of ischemic heart disease and other diseases of the circulatory system: Secondary | ICD-10-CM | POA: Insufficient documentation

## 2024-08-28 DIAGNOSIS — Z86018 Personal history of other benign neoplasm: Secondary | ICD-10-CM | POA: Insufficient documentation

## 2024-08-28 DIAGNOSIS — Z808 Family history of malignant neoplasm of other organs or systems: Secondary | ICD-10-CM | POA: Insufficient documentation

## 2024-08-28 DIAGNOSIS — Z59868 Other specified financial insecurity: Secondary | ICD-10-CM | POA: Insufficient documentation

## 2024-08-28 DIAGNOSIS — R1032 Left lower quadrant pain: Secondary | ICD-10-CM | POA: Insufficient documentation

## 2024-08-28 DIAGNOSIS — Z8041 Family history of malignant neoplasm of ovary: Secondary | ICD-10-CM | POA: Insufficient documentation

## 2024-08-28 DIAGNOSIS — Z9049 Acquired absence of other specified parts of digestive tract: Secondary | ICD-10-CM | POA: Insufficient documentation

## 2024-08-28 DIAGNOSIS — Z833 Family history of diabetes mellitus: Secondary | ICD-10-CM | POA: Insufficient documentation

## 2024-08-28 DIAGNOSIS — Z90721 Acquired absence of ovaries, unilateral: Secondary | ICD-10-CM | POA: Insufficient documentation

## 2024-08-28 DIAGNOSIS — Z800 Family history of malignant neoplasm of digestive organs: Secondary | ICD-10-CM | POA: Insufficient documentation

## 2024-08-28 DIAGNOSIS — R6883 Chills (without fever): Secondary | ICD-10-CM | POA: Insufficient documentation

## 2024-08-28 DIAGNOSIS — R110 Nausea: Secondary | ICD-10-CM | POA: Insufficient documentation

## 2024-08-28 DIAGNOSIS — Z83438 Family history of other disorder of lipoprotein metabolism and other lipidemia: Secondary | ICD-10-CM | POA: Insufficient documentation

## 2024-08-28 DIAGNOSIS — K5909 Other constipation: Secondary | ICD-10-CM | POA: Insufficient documentation

## 2024-08-28 DIAGNOSIS — Z803 Family history of malignant neoplasm of breast: Secondary | ICD-10-CM | POA: Insufficient documentation

## 2024-08-28 DIAGNOSIS — G4733 Obstructive sleep apnea (adult) (pediatric): Secondary | ICD-10-CM | POA: Insufficient documentation

## 2024-08-28 LAB — CBC (CANCER CENTER ONLY)
HCT: 37.1 % (ref 36.0–46.0)
Hemoglobin: 11.8 g/dL — ABNORMAL LOW (ref 12.0–15.0)
MCH: 28.9 pg (ref 26.0–34.0)
MCHC: 31.8 g/dL (ref 30.0–36.0)
MCV: 90.7 fL (ref 80.0–100.0)
Platelet Count: 328 K/uL (ref 150–400)
RBC: 4.09 MIL/uL (ref 3.87–5.11)
RDW: 14.7 % (ref 11.5–15.5)
WBC Count: 8.1 K/uL (ref 4.0–10.5)
nRBC: 0 % (ref 0.0–0.2)

## 2024-08-28 LAB — URINALYSIS, COMPLETE (UACMP) WITH MICROSCOPIC
Bilirubin Urine: NEGATIVE
Glucose, UA: NEGATIVE mg/dL
Hgb urine dipstick: NEGATIVE
Ketones, ur: NEGATIVE mg/dL
Nitrite: NEGATIVE
Protein, ur: NEGATIVE mg/dL
Specific Gravity, Urine: 1.028 (ref 1.005–1.030)
pH: 5 (ref 5.0–8.0)

## 2024-08-28 LAB — BASIC METABOLIC PANEL WITH GFR
Anion gap: 7 (ref 5–15)
BUN: 14 mg/dL (ref 8–23)
CO2: 25 mmol/L (ref 22–32)
Calcium: 9.3 mg/dL (ref 8.9–10.3)
Chloride: 111 mmol/L (ref 98–111)
Creatinine, Ser: 0.92 mg/dL (ref 0.44–1.00)
GFR, Estimated: >60 mL/min (ref >60)
Glucose, Bld: 82 mg/dL (ref 70–99)
Potassium: 4.5 mmol/L (ref 3.5–5.1)
Sodium: 143 mmol/L (ref 135–145)

## 2024-08-28 MED ORDER — IOHEXOL 300 MG/ML  SOLN
100.0000 mL | Freq: Once | INTRAMUSCULAR | Status: AC | PRN
  Administered 2024-08-28: 100 mL via INTRAVENOUS

## 2024-08-28 NOTE — Patient Instructions (Addendum)
 It was good seeing you today.   No concerning findings on today's exam. The sutures at the vaginal cuff are starting to dissolve.   We typically recommend waiting 12 full weeks before resuming intercourse. Since you still have some suture present, would recommend waiting an additional 2 weeks. Also you may need to use lubrication such as coconut oil or olive oil.   I would recommend continuing to work on the constipation and continue medications you are currently taking.   We will plan on obtaining a CT scan to further look into the abdominal pain. Nothing to eat or drink 4 hours before your scan and plan to arrive two hours before to begin drinking oral contrast.  When you do resume intercourse, we typically recommend positions where you can control the depth of insertion.   In regards to hair loss, you can see how your symptoms are over the next several months after your body adjusts to hormone changes. You can also talk with your PCP about checking your thyroid  etc.   We will check lab work today as well and let you know the results.

## 2024-08-28 NOTE — Telephone Encounter (Signed)
 Spoke with Ms. Styles and relayed message from Eleanor Epps, NP that on patient's CT scan they do not see any abnormalities explaining her pain. She is moderately constipated. Would recommend increasing her bowel regime.   1. No acute abdominopelvic findings. 2. Moderate volume stool throughout the colon. 3.  Aortic Atherosclerosis (ICD10-I70.0).   CBC and metabolic panel are stable.  Hemoglobin is increasing currently at 11.8.  Urine sample showed a rare amount of bacteria with [place] blood cells.  This was sent to the lab for culture. The office will call when the results come back. Pt verbalized understanding and thanked the office for calling.

## 2024-08-28 NOTE — Telephone Encounter (Signed)
-----   Message from Eleanor JONETTA Epps sent at 08/28/2024  2:39 PM EST ----- Please call the patient and let her know her CT scan results. They do not see any abnormalities explaining her pain. She is moderately constipated so would recommend increasing her bowel regimen.  1. No acute abdominopelvic findings. 2. Moderate volume stool throughout the colon. 3.  Aortic Atherosclerosis (ICD10-I70.0).  Her CBC and metabolic panel are stable.  Hemoglobin is increasing currently at 11.8.  Her urine sample showed a rare amount of bacteria with [place] blood cells.  This was sent to the lab for culture.   We will contact her when these results return. ----- Message ----- From: Interface, Rad Results In Sent: 08/28/2024   1:09 PM EST To: Eleanor JONETTA Epps, NP

## 2024-08-29 LAB — URINE CULTURE: Culture: NO GROWTH

## 2024-08-31 NOTE — Telephone Encounter (Signed)
 LVM for Destiny Henderson to call the office regarding the negative results of a recent urine culture as reported by Eleanor Epps NP

## 2024-08-31 NOTE — Telephone Encounter (Signed)
-----   Message from Eleanor JONETTA Epps sent at 08/31/2024  7:58 AM EST ----- Please let her know no evidence of infection with urine culture results.  ----- Message ----- From: Rebecka, Lab In [place] Sent: 08/28/2024   9:58 AM EST To: Eleanor JONETTA Epps, NP

## 2024-08-31 NOTE — Telephone Encounter (Signed)
 Attempted to reach patient to relay results. Left voicemail stating there is no evidence of infection with urine culture. Don't hesitate to call the office with any concerns or questions.
